# Patient Record
Sex: Male | Born: 1935 | Race: White | Hispanic: No | Smoking: Former smoker
Health system: Southern US, Community
[De-identification: ages and names within clinical notes are randomized; demographics above are authoritative.]

## PROBLEM LIST (undated history)

## (undated) DIAGNOSIS — N183 Chronic kidney disease, stage 3 unspecified: Secondary | ICD-10-CM

## (undated) DIAGNOSIS — R413 Other amnesia: Secondary | ICD-10-CM

## (undated) DIAGNOSIS — R55 Syncope and collapse: Secondary | ICD-10-CM

## (undated) DIAGNOSIS — H5440 Blindness, one eye, unspecified eye: Secondary | ICD-10-CM

## (undated) DIAGNOSIS — C140 Malignant neoplasm of pharynx, unspecified: Secondary | ICD-10-CM

## (undated) DIAGNOSIS — H9190 Unspecified hearing loss, unspecified ear: Secondary | ICD-10-CM

## (undated) DIAGNOSIS — M5410 Radiculopathy, site unspecified: Secondary | ICD-10-CM

## (undated) DIAGNOSIS — N400 Enlarged prostate without lower urinary tract symptoms: Secondary | ICD-10-CM

## (undated) DIAGNOSIS — Z87442 Personal history of urinary calculi: Secondary | ICD-10-CM

## (undated) DIAGNOSIS — T887XXA Unspecified adverse effect of drug or medicament, initial encounter: Secondary | ICD-10-CM

## (undated) DIAGNOSIS — K219 Gastro-esophageal reflux disease without esophagitis: Secondary | ICD-10-CM

## (undated) DIAGNOSIS — I714 Abdominal aortic aneurysm, without rupture, unspecified: Secondary | ICD-10-CM

## (undated) DIAGNOSIS — I2510 Atherosclerotic heart disease of native coronary artery without angina pectoris: Secondary | ICD-10-CM

## (undated) DIAGNOSIS — M47812 Spondylosis without myelopathy or radiculopathy, cervical region: Secondary | ICD-10-CM

## (undated) DIAGNOSIS — K120 Recurrent oral aphthae: Secondary | ICD-10-CM

## (undated) DIAGNOSIS — E785 Hyperlipidemia, unspecified: Secondary | ICD-10-CM

## (undated) DIAGNOSIS — I6529 Occlusion and stenosis of unspecified carotid artery: Secondary | ICD-10-CM

## (undated) HISTORY — DX: Malignant neoplasm of pharynx, unspecified: C14.0

## (undated) HISTORY — DX: Unspecified adverse effect of drug or medicament, initial encounter: T88.7XXA

## (undated) HISTORY — PX: HERNIA REPAIR: SHX51

## (undated) HISTORY — DX: Abdominal aortic aneurysm, without rupture, unspecified: I71.40

## (undated) HISTORY — DX: Other amnesia: R41.3

## (undated) HISTORY — DX: Gastro-esophageal reflux disease without esophagitis: K21.9

## (undated) HISTORY — DX: Spondylosis without myelopathy or radiculopathy, cervical region: M47.812

## (undated) HISTORY — PX: CAROTID ENDARTERECTOMY: SUR193

## (undated) HISTORY — PX: PROSTATE SURGERY: SHX751

## (undated) HISTORY — PX: BACK SURGERY: SHX140

## (undated) HISTORY — DX: Personal history of urinary calculi: Z87.442

## (undated) HISTORY — DX: Atherosclerotic heart disease of native coronary artery without angina pectoris: I25.10

## (undated) HISTORY — DX: Unspecified hearing loss, unspecified ear: H91.90

## (undated) HISTORY — DX: Syncope and collapse: R55

## (undated) HISTORY — DX: Abdominal aortic aneurysm, without rupture: I71.4

## (undated) HISTORY — DX: Occlusion and stenosis of unspecified carotid artery: I65.29

## (undated) HISTORY — DX: Blindness, one eye, unspecified eye: H54.40

## (undated) HISTORY — PX: CHOLECYSTECTOMY: SHX55

## (undated) HISTORY — PX: CARDIAC CATHETERIZATION: SHX172

## (undated) HISTORY — PX: THROAT SURGERY: SHX803

## (undated) HISTORY — DX: Recurrent oral aphthae: K12.0

## (undated) HISTORY — DX: Radiculopathy, site unspecified: M54.10

## (undated) HISTORY — PX: CORONARY ARTERY BYPASS GRAFT: SHX141

## (undated) HISTORY — PX: OTHER SURGICAL HISTORY: SHX169

## (undated) HISTORY — DX: Benign prostatic hyperplasia without lower urinary tract symptoms: N40.0

## (undated) HISTORY — DX: Hyperlipidemia, unspecified: E78.5

---

## 1996-01-17 HISTORY — PX: CORONARY ARTERY BYPASS GRAFT: SHX141

## 1998-07-01 ENCOUNTER — Ambulatory Visit (HOSPITAL_COMMUNITY): Admission: RE | Admit: 1998-07-01 | Discharge: 1998-07-01 | Payer: Self-pay | Admitting: Urology

## 1998-07-01 ENCOUNTER — Encounter: Payer: Self-pay | Admitting: Urology

## 1998-09-21 ENCOUNTER — Ambulatory Visit (HOSPITAL_COMMUNITY): Admission: RE | Admit: 1998-09-21 | Discharge: 1998-09-21 | Payer: Self-pay | Admitting: Urology

## 1998-09-21 ENCOUNTER — Encounter: Payer: Self-pay | Admitting: Urology

## 2001-11-04 ENCOUNTER — Encounter: Admission: RE | Admit: 2001-11-04 | Discharge: 2001-11-04 | Payer: Self-pay | Admitting: Otolaryngology

## 2001-11-04 ENCOUNTER — Encounter: Payer: Self-pay | Admitting: Otolaryngology

## 2002-03-06 ENCOUNTER — Inpatient Hospital Stay (HOSPITAL_COMMUNITY): Admission: EM | Admit: 2002-03-06 | Discharge: 2002-03-07 | Payer: Self-pay | Admitting: Emergency Medicine

## 2004-03-01 ENCOUNTER — Ambulatory Visit: Payer: Self-pay | Admitting: Internal Medicine

## 2004-09-06 ENCOUNTER — Ambulatory Visit: Payer: Self-pay | Admitting: Internal Medicine

## 2004-11-15 ENCOUNTER — Ambulatory Visit: Payer: Self-pay | Admitting: Internal Medicine

## 2005-02-08 ENCOUNTER — Encounter: Admission: RE | Admit: 2005-02-08 | Discharge: 2005-02-08 | Payer: Self-pay | Admitting: Neurology

## 2005-02-14 ENCOUNTER — Ambulatory Visit: Payer: Self-pay | Admitting: Internal Medicine

## 2005-02-27 ENCOUNTER — Encounter: Admission: RE | Admit: 2005-02-27 | Discharge: 2005-02-27 | Payer: Self-pay | Admitting: Neurology

## 2005-04-18 ENCOUNTER — Ambulatory Visit: Payer: Self-pay | Admitting: Internal Medicine

## 2005-06-20 ENCOUNTER — Encounter: Admission: RE | Admit: 2005-06-20 | Discharge: 2005-06-20 | Payer: Self-pay | Admitting: Neurosurgery

## 2005-07-04 ENCOUNTER — Ambulatory Visit: Payer: Self-pay | Admitting: Internal Medicine

## 2005-10-19 ENCOUNTER — Ambulatory Visit: Payer: Self-pay | Admitting: Internal Medicine

## 2005-12-21 ENCOUNTER — Encounter: Admission: RE | Admit: 2005-12-21 | Discharge: 2005-12-21 | Payer: Self-pay | Admitting: Neurosurgery

## 2006-02-06 ENCOUNTER — Ambulatory Visit: Payer: Self-pay | Admitting: Internal Medicine

## 2006-04-10 ENCOUNTER — Ambulatory Visit: Payer: Self-pay | Admitting: Internal Medicine

## 2006-04-22 ENCOUNTER — Emergency Department (HOSPITAL_COMMUNITY): Admission: EM | Admit: 2006-04-22 | Discharge: 2006-04-22 | Payer: Self-pay | Admitting: Emergency Medicine

## 2006-05-01 ENCOUNTER — Ambulatory Visit (HOSPITAL_BASED_OUTPATIENT_CLINIC_OR_DEPARTMENT_OTHER): Admission: RE | Admit: 2006-05-01 | Discharge: 2006-05-01 | Payer: Self-pay | Admitting: Urology

## 2006-06-25 ENCOUNTER — Ambulatory Visit: Payer: Self-pay | Admitting: Internal Medicine

## 2006-06-25 LAB — HM COLONOSCOPY

## 2006-07-04 ENCOUNTER — Ambulatory Visit: Payer: Self-pay | Admitting: Internal Medicine

## 2006-07-04 LAB — CONVERTED CEMR LAB
Bilirubin, Direct: 0.2 mg/dL (ref 0.0–0.3)
LDL Cholesterol: 39 mg/dL (ref 0–99)
Total Bilirubin: 0.9 mg/dL (ref 0.3–1.2)
VLDL: 12 mg/dL (ref 0–40)

## 2006-07-11 ENCOUNTER — Ambulatory Visit: Payer: Self-pay | Admitting: Internal Medicine

## 2006-09-10 ENCOUNTER — Inpatient Hospital Stay (HOSPITAL_COMMUNITY): Admission: EM | Admit: 2006-09-10 | Discharge: 2006-09-12 | Payer: Self-pay | Admitting: Emergency Medicine

## 2006-09-10 ENCOUNTER — Ambulatory Visit: Payer: Self-pay | Admitting: Vascular Surgery

## 2006-09-14 DIAGNOSIS — E785 Hyperlipidemia, unspecified: Secondary | ICD-10-CM

## 2006-09-14 DIAGNOSIS — I2510 Atherosclerotic heart disease of native coronary artery without angina pectoris: Secondary | ICD-10-CM

## 2006-09-14 DIAGNOSIS — Z87442 Personal history of urinary calculi: Secondary | ICD-10-CM

## 2006-09-21 ENCOUNTER — Ambulatory Visit (HOSPITAL_BASED_OUTPATIENT_CLINIC_OR_DEPARTMENT_OTHER): Admission: RE | Admit: 2006-09-21 | Discharge: 2006-09-21 | Payer: Self-pay | Admitting: Urology

## 2006-10-17 ENCOUNTER — Ambulatory Visit: Payer: Self-pay | Admitting: Internal Medicine

## 2006-10-17 LAB — CONVERTED CEMR LAB
AST: 27 units/L (ref 0–37)
Alkaline Phosphatase: 87 units/L (ref 39–117)
Cholesterol: 108 mg/dL (ref 0–200)
Creatinine, Ser: 1.2 mg/dL (ref 0.4–1.5)
GFR calc Af Amer: 76 mL/min
Glucose, Bld: 87 mg/dL (ref 70–99)
HDL: 33 mg/dL — ABNORMAL LOW (ref >39.0)
Potassium: 3.9 meq/L (ref 3.5–5.1)
Sodium: 144 meq/L (ref 135–145)
Total CHOL/HDL Ratio: 3.3

## 2006-10-24 ENCOUNTER — Ambulatory Visit: Payer: Self-pay | Admitting: Internal Medicine

## 2006-10-24 DIAGNOSIS — K219 Gastro-esophageal reflux disease without esophagitis: Secondary | ICD-10-CM

## 2006-10-24 LAB — CONVERTED CEMR LAB: HDL goal, serum: 40 mg/dL

## 2006-11-30 ENCOUNTER — Encounter: Admission: RE | Admit: 2006-11-30 | Discharge: 2006-11-30 | Payer: Self-pay | Admitting: Vascular Surgery

## 2006-11-30 ENCOUNTER — Ambulatory Visit: Payer: Self-pay | Admitting: Vascular Surgery

## 2006-11-30 ENCOUNTER — Encounter: Payer: Self-pay | Admitting: Internal Medicine

## 2006-12-17 ENCOUNTER — Telehealth: Payer: Self-pay | Admitting: Internal Medicine

## 2006-12-27 ENCOUNTER — Encounter: Payer: Self-pay | Admitting: Internal Medicine

## 2006-12-27 ENCOUNTER — Encounter: Admission: RE | Admit: 2006-12-27 | Discharge: 2006-12-27 | Payer: Self-pay | Admitting: Neurosurgery

## 2007-02-19 ENCOUNTER — Ambulatory Visit: Payer: Self-pay | Admitting: Internal Medicine

## 2007-02-25 ENCOUNTER — Ambulatory Visit: Payer: Self-pay | Admitting: Internal Medicine

## 2007-03-01 ENCOUNTER — Telehealth: Payer: Self-pay | Admitting: Internal Medicine

## 2007-03-04 ENCOUNTER — Ambulatory Visit: Payer: Self-pay | Admitting: Internal Medicine

## 2007-03-05 ENCOUNTER — Telehealth: Payer: Self-pay | Admitting: *Deleted

## 2007-03-05 ENCOUNTER — Ambulatory Visit: Payer: Self-pay | Admitting: Internal Medicine

## 2007-03-05 DIAGNOSIS — T887XXA Unspecified adverse effect of drug or medicament, initial encounter: Secondary | ICD-10-CM | POA: Insufficient documentation

## 2007-03-05 LAB — CONVERTED CEMR LAB
BUN: 12 mg/dL (ref 6–23)
Creatinine, Ser: 1.1 mg/dL (ref 0.4–1.5)

## 2007-03-06 ENCOUNTER — Ambulatory Visit: Payer: Self-pay | Admitting: Internal Medicine

## 2007-03-11 ENCOUNTER — Telehealth: Payer: Self-pay | Admitting: Internal Medicine

## 2007-07-02 ENCOUNTER — Ambulatory Visit: Payer: Self-pay | Admitting: Internal Medicine

## 2007-08-14 ENCOUNTER — Telehealth: Payer: Self-pay | Admitting: Internal Medicine

## 2007-08-14 DIAGNOSIS — K120 Recurrent oral aphthae: Secondary | ICD-10-CM

## 2007-11-12 ENCOUNTER — Ambulatory Visit: Payer: Self-pay | Admitting: Internal Medicine

## 2007-11-12 LAB — CONVERTED CEMR LAB
ALT: 16 units/L (ref 0–53)
BUN: 11 mg/dL (ref 6–23)
Bilirubin, Direct: 0.2 mg/dL (ref 0.0–0.3)
Eosinophils Absolute: 0.3 10*3/uL (ref 0.0–0.7)
Eosinophils Relative: 4.9 % (ref 0.0–5.0)
GFR calc Af Amer: 76 mL/min
GFR calc non Af Amer: 63 mL/min
Hemoglobin: 14.9 g/dL (ref 13.0–17.0)
MCHC: 34.1 g/dL (ref 30.0–36.0)
MCV: 94 fL (ref 78.0–100.0)
Neutro Abs: 3.4 10*3/uL (ref 1.4–7.7)
PSA: 2.55 ng/mL (ref 0.10–4.00)
Platelets: 178 10*3/uL (ref 150–400)
Potassium: 4.2 meq/L (ref 3.5–5.1)
RDW: 13.3 % (ref 11.5–14.6)
Total CHOL/HDL Ratio: 2.8
Total Protein: 6.5 g/dL (ref 6.0–8.3)
VLDL: 10 mg/dL (ref 0–40)

## 2007-11-25 ENCOUNTER — Ambulatory Visit: Payer: Self-pay | Admitting: Internal Medicine

## 2007-11-25 DIAGNOSIS — N400 Enlarged prostate without lower urinary tract symptoms: Secondary | ICD-10-CM

## 2007-11-29 ENCOUNTER — Ambulatory Visit: Payer: Self-pay | Admitting: Vascular Surgery

## 2008-02-07 ENCOUNTER — Encounter: Admission: RE | Admit: 2008-02-07 | Discharge: 2008-02-07 | Payer: Self-pay | Admitting: Neurosurgery

## 2008-03-19 ENCOUNTER — Ambulatory Visit: Payer: Self-pay | Admitting: Internal Medicine

## 2008-03-19 DIAGNOSIS — M545 Low back pain: Secondary | ICD-10-CM

## 2008-04-15 ENCOUNTER — Encounter: Admission: RE | Admit: 2008-04-15 | Discharge: 2008-04-15 | Payer: Self-pay | Admitting: Neurosurgery

## 2008-05-26 ENCOUNTER — Ambulatory Visit: Payer: Self-pay | Admitting: Internal Medicine

## 2008-05-26 LAB — CONVERTED CEMR LAB
AST: 23 units/L (ref 0–37)
Albumin: 3.7 g/dL (ref 3.5–5.2)
Cholesterol: 144 mg/dL (ref 0–200)
LDL Cholesterol: 85 mg/dL (ref 0–99)
Total Protein: 6.3 g/dL (ref 6.0–8.3)

## 2008-06-02 ENCOUNTER — Ambulatory Visit: Payer: Self-pay | Admitting: Internal Medicine

## 2008-08-26 ENCOUNTER — Encounter: Payer: Self-pay | Admitting: Internal Medicine

## 2008-10-06 ENCOUNTER — Ambulatory Visit: Payer: Self-pay | Admitting: Internal Medicine

## 2008-10-29 ENCOUNTER — Ambulatory Visit: Payer: Self-pay | Admitting: Internal Medicine

## 2008-10-29 ENCOUNTER — Telehealth: Payer: Self-pay | Admitting: Internal Medicine

## 2008-10-29 ENCOUNTER — Inpatient Hospital Stay (HOSPITAL_COMMUNITY): Admission: EM | Admit: 2008-10-29 | Discharge: 2008-10-30 | Payer: Self-pay | Admitting: Emergency Medicine

## 2008-10-30 ENCOUNTER — Ambulatory Visit: Payer: Self-pay | Admitting: Internal Medicine

## 2008-11-06 ENCOUNTER — Telehealth: Payer: Self-pay | Admitting: Internal Medicine

## 2008-11-12 ENCOUNTER — Ambulatory Visit: Payer: Self-pay | Admitting: Internal Medicine

## 2008-11-13 DIAGNOSIS — I714 Abdominal aortic aneurysm, without rupture, unspecified: Secondary | ICD-10-CM | POA: Insufficient documentation

## 2008-11-17 ENCOUNTER — Ambulatory Visit: Payer: Self-pay | Admitting: Internal Medicine

## 2008-11-17 DIAGNOSIS — R55 Syncope and collapse: Secondary | ICD-10-CM

## 2008-11-27 ENCOUNTER — Ambulatory Visit: Payer: Self-pay | Admitting: Vascular Surgery

## 2008-11-27 ENCOUNTER — Telehealth: Payer: Self-pay | Admitting: Internal Medicine

## 2008-11-27 ENCOUNTER — Encounter (INDEPENDENT_AMBULATORY_CARE_PROVIDER_SITE_OTHER): Payer: Self-pay | Admitting: *Deleted

## 2008-12-22 ENCOUNTER — Ambulatory Visit: Payer: Self-pay | Admitting: Internal Medicine

## 2008-12-22 LAB — CONVERTED CEMR LAB
ALT: 14 units/L (ref 0–53)
BUN: 14 mg/dL (ref 6–23)
Chloride: 111 meq/L (ref 96–112)
Cholesterol: 124 mg/dL (ref 0–200)
Creatinine, Ser: 1.2 mg/dL (ref 0.4–1.5)
GFR calc non Af Amer: 62.63 mL/min (ref >60)
Glucose, Bld: 84 mg/dL (ref 70–99)
Hemoglobin: 15 g/dL (ref 13.0–17.0)
LDL Cholesterol: 68 mg/dL (ref 0–99)
MCHC: 34 g/dL (ref 30.0–36.0)
MCV: 94.7 fL (ref 78.0–100.0)
Neutro Abs: 3.4 10*3/uL (ref 1.4–7.7)
Potassium: 4.1 meq/L (ref 3.5–5.1)
RBC: 4.64 M/uL (ref 4.22–5.81)
Total CHOL/HDL Ratio: 3
Triglycerides: 83 mg/dL (ref 0.0–149.0)
VLDL: 16.6 mg/dL (ref 0.0–40.0)
WBC: 5.1 10*3/uL (ref 4.5–10.5)

## 2008-12-29 ENCOUNTER — Ambulatory Visit: Payer: Self-pay | Admitting: Internal Medicine

## 2009-03-16 ENCOUNTER — Ambulatory Visit: Payer: Self-pay | Admitting: Internal Medicine

## 2009-03-16 DIAGNOSIS — I10 Essential (primary) hypertension: Secondary | ICD-10-CM | POA: Insufficient documentation

## 2009-03-16 DIAGNOSIS — R0989 Other specified symptoms and signs involving the circulatory and respiratory systems: Secondary | ICD-10-CM

## 2009-04-06 ENCOUNTER — Ambulatory Visit: Payer: Self-pay | Admitting: Internal Medicine

## 2009-04-06 LAB — CONVERTED CEMR LAB
Chloride: 111 meq/L (ref 96–112)
Creatinine, Ser: 1.1 mg/dL (ref 0.4–1.5)
GFR calc non Af Amer: 69.2 mL/min (ref >60)
PSA: 2.56 ng/mL (ref 0.10–4.00)

## 2009-04-07 ENCOUNTER — Encounter: Payer: Self-pay | Admitting: Cardiovascular Disease

## 2009-04-08 ENCOUNTER — Telehealth: Payer: Self-pay | Admitting: Internal Medicine

## 2009-04-08 ENCOUNTER — Encounter: Payer: Self-pay | Admitting: Internal Medicine

## 2009-04-08 ENCOUNTER — Ambulatory Visit: Payer: Self-pay

## 2009-04-09 ENCOUNTER — Encounter: Payer: Self-pay | Admitting: Internal Medicine

## 2009-04-09 ENCOUNTER — Ambulatory Visit: Payer: Self-pay | Admitting: Vascular Surgery

## 2009-04-20 ENCOUNTER — Encounter: Payer: Self-pay | Admitting: Vascular Surgery

## 2009-04-20 ENCOUNTER — Ambulatory Visit: Payer: Self-pay | Admitting: Vascular Surgery

## 2009-04-20 ENCOUNTER — Inpatient Hospital Stay (HOSPITAL_COMMUNITY): Admission: RE | Admit: 2009-04-20 | Discharge: 2009-04-21 | Payer: Self-pay | Admitting: Vascular Surgery

## 2009-05-14 ENCOUNTER — Encounter: Payer: Self-pay | Admitting: Internal Medicine

## 2009-05-14 ENCOUNTER — Ambulatory Visit: Payer: Self-pay | Admitting: Vascular Surgery

## 2009-06-10 ENCOUNTER — Inpatient Hospital Stay (HOSPITAL_COMMUNITY): Admission: EM | Admit: 2009-06-10 | Discharge: 2009-06-11 | Payer: Self-pay

## 2009-06-18 ENCOUNTER — Emergency Department (HOSPITAL_COMMUNITY): Admission: EM | Admit: 2009-06-18 | Discharge: 2009-06-19 | Payer: Self-pay | Admitting: Emergency Medicine

## 2009-06-23 ENCOUNTER — Telehealth: Payer: Self-pay | Admitting: Internal Medicine

## 2009-07-20 ENCOUNTER — Encounter: Payer: Self-pay | Admitting: Internal Medicine

## 2009-08-02 ENCOUNTER — Ambulatory Visit: Payer: Self-pay | Admitting: Internal Medicine

## 2009-08-02 LAB — CONVERTED CEMR LAB
BUN: 16 mg/dL (ref 6–23)
Bilirubin, Direct: 0.2 mg/dL (ref 0.0–0.3)
CO2: 31 meq/L (ref 19–32)
Calcium: 9.1 mg/dL (ref 8.4–10.5)
Chloride: 112 meq/L (ref 96–112)
Creatinine, Ser: 1.1 mg/dL (ref 0.4–1.5)
Eosinophils Absolute: 0.3 10*3/uL (ref 0.0–0.7)
Eosinophils Relative: 3.6 % (ref 0.0–5.0)
GFR calc non Af Amer: 67.02 mL/min (ref >60)
Glucose, Bld: 72 mg/dL (ref 70–99)
MCHC: 33.4 g/dL (ref 30.0–36.0)
Neutrophils Relative %: 71.2 % (ref 43.0–77.0)
RBC: 4.62 M/uL (ref 4.22–5.81)
Sodium: 146 meq/L — ABNORMAL HIGH (ref 135–145)
Total Bilirubin: 1.1 mg/dL (ref 0.3–1.2)
WBC: 8 10*3/uL (ref 4.5–10.5)

## 2009-08-12 ENCOUNTER — Telehealth: Payer: Self-pay | Admitting: Internal Medicine

## 2009-09-21 ENCOUNTER — Ambulatory Visit: Payer: Self-pay | Admitting: Internal Medicine

## 2009-10-04 ENCOUNTER — Ambulatory Visit: Payer: Self-pay | Admitting: Internal Medicine

## 2009-10-15 ENCOUNTER — Encounter: Payer: Self-pay | Admitting: Internal Medicine

## 2009-10-18 ENCOUNTER — Encounter: Payer: Self-pay | Admitting: Internal Medicine

## 2009-10-18 ENCOUNTER — Ambulatory Visit: Payer: Self-pay

## 2009-10-22 ENCOUNTER — Encounter: Payer: Self-pay | Admitting: Internal Medicine

## 2009-10-28 ENCOUNTER — Encounter: Admission: RE | Admit: 2009-10-28 | Discharge: 2009-10-28 | Payer: Self-pay | Admitting: Neurosurgery

## 2009-10-28 ENCOUNTER — Encounter: Payer: Self-pay | Admitting: Internal Medicine

## 2009-11-02 ENCOUNTER — Ambulatory Visit: Payer: Self-pay | Admitting: Internal Medicine

## 2009-11-03 ENCOUNTER — Telehealth: Payer: Self-pay | Admitting: Internal Medicine

## 2009-11-05 ENCOUNTER — Telehealth: Payer: Self-pay | Admitting: Internal Medicine

## 2009-11-15 ENCOUNTER — Telehealth: Payer: Self-pay | Admitting: Internal Medicine

## 2009-11-17 ENCOUNTER — Inpatient Hospital Stay (HOSPITAL_COMMUNITY): Admission: RE | Admit: 2009-11-17 | Discharge: 2009-11-18 | Payer: Self-pay | Admitting: Neurosurgery

## 2009-12-06 ENCOUNTER — Encounter: Admission: RE | Admit: 2009-12-06 | Discharge: 2009-12-06 | Payer: Self-pay | Admitting: Neurosurgery

## 2009-12-14 ENCOUNTER — Encounter: Payer: Self-pay | Admitting: Internal Medicine

## 2009-12-14 ENCOUNTER — Ambulatory Visit: Payer: Self-pay | Admitting: Vascular Surgery

## 2010-02-04 ENCOUNTER — Ambulatory Visit: Payer: Self-pay | Admitting: Vascular Surgery

## 2010-02-04 ENCOUNTER — Ambulatory Visit
Admission: RE | Admit: 2010-02-04 | Discharge: 2010-02-04 | Payer: Self-pay | Source: Home / Self Care | Attending: Internal Medicine | Admitting: Internal Medicine

## 2010-02-15 NOTE — Assessment & Plan Note (Signed)
Summary: 4 MONTH ROV/SL   Visit Type:  Follow-up Primary Provider:  Dr.J. Lovell Sheehan  CC:  no complaints.  History of Present Illness: [name] is a 77 year old patient with a history of coronary artery disease status post five-vessel CABG in December 1994, who underwent cath in 10/10 which showed severe native 3-v CAD with all 5/5 grafts patent. Had some bradycardict and an event monitor was placed which showeds sinus rhythm 50-85. Occasional PVC. Brief 6-beat run SVT.   Returns for routine f/u. Doing very well. No CP or SOB. No syncope or presyncope. No neurologic deficits. Saw Dr. Arbie Cookey in November 2010 for f/u of his AAA. No problems with meds. Following lipids with Dr. Lovell Sheehan. 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  Current Medications (verified): 1)  Aspirin Ec 325 Mg  Tbec (Aspirin) .... 4 Times A Week 2)  Lexapro 10 Mg  Tabs (Escitalopram Oxalate) .... Take 1 Tablet By Mouth Once A Day 3)  Mobic 15 Mg  Tabs (Meloxicam) .... As Needed 4)  Flomax 0.4 Mg  Cp24 (Tamsulosin Hcl) .... Q Hs   May Use Generic 5)  Vitamin E 400 Unit Caps (Vitamin E) 6)  Crestor 20 Mg Tabs (Rosuvastatin Calcium) .... 1/2 Every Other Day  Allergies (verified): 1)  ! Vibramycin (Doxycycline Hyclate) 2)  ! Codeine Sulfate (Codeine Sulfate)  Past History:  Past Medical History: 1. left subclavian bruit 2. ABDOMINAL AORTIC ANEURYSM     --u/s 11/09 3.8x3.8cm    --f 3. BACK PAIN, LUMBAR, WITH RADICULOPATHY 4. BENIGN PROSTATIC HYPERTROPHY, WITH URINARY OBSTRUCTION 5. APHTHOUS ULCERS  6. UNS ADVRS EFF UNS RX MEDICINAL&BIOLOGICAL SBSTNC  7. GERD  8. NEPHROLITHIASIS, HX OF  9. HYPERLIPIDEMIA 10. CORONARY ARTERY DISEASE        --s/p CABG 1993       --Cardiac cath 10/29/08. Severe 3-v CAD all grafts patent 11. FAMILY HISTORY OF CAD MALE 1ST DEGREE RELATIVE <50  12. Presyncope - monitor ok 10/10 13. 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   Vital Signs:  Patient profile:   77 year old male Height:      72 inches Weight:      191 pounds BMI:     26.00 Pulse rate:   58 / minute BP sitting:   132 / 68  (left arm) Cuff size:   regular  Vitals Entered By: Hardin Negus, RMA (March 16, 2009 8:12 AM)  Physical Exam  General:  Gen: well appearing. no resp difficulty. hoarse voice and hard of hearing HEENT: normal Neck: supple. no JVD. Carotids 2+ bilat; prominent L bruit. No lymphadenopathy or thryomegaly appreciated. Cor: Barrel chested. PMI nondisplaced. Regular rate & rhythm. No rubs, gallops, murmur. Lungs: clear Abdomen: soft, nontender, nondistended. No hepatosplenomegaly. No bruits or masses. Good bowel sounds. Extremities: no cyanosis, clubbing, rash, edema Neuro: alert & orientedx3, cranial nerves grossly intact. moves all 4 extremities w/o difficulty. affect pleasant    Impression & Recommendations:  Problem # 1:  CORONARY ARTERY DISEASE (ICD-414.00) Stable. No evidence of ischemia. Recent cath reassuring. Continue current regimen.  Problem # 2:  CAROTID BRUIT (ICD-785.9) Check carotid u/s.  Problem # 3:  HYPERTENSION, BENIGN (ICD-401.1) Blood pressure well controlled. Continue current regimen.  Problem # 4:  HYPERLIPIDEMIA (ICD-272.4) Goal LDL <70. Continue statin.   Other Orders: EKG w/ Interpretation (  93000) Carotid Duplex (Carotid Duplex)  Patient Instructions: 1)  Your physician has requested that you have a carotid duplex. This test is an ultrasound of the carotid arteries in your neck. It looks at blood flow through these arteries that supply the brain with blood. Allow one hour for this exam. There are no restrictions or special instructions. 2)  Follow up in 6 months

## 2010-02-15 NOTE — Progress Notes (Signed)
Summary: surg clearance  Phone Note Other Incoming   Summary of Call: Received surg clearance form from Dr Gerlene Fee, pt needs to have c/4-5, c/5-6, c/6-7 Anterior cervical fusion it is sch for 11/2 and they need cardiac clearance, will send mess to Dr Gala Romney for review  Initial call taken by: Meredith Staggers, RN,  November 05, 2009 12:52 PM  Follow-up for Phone Call        cath looked good 1 year ago so cleared to proceed unless having recurrent CP or other anginal symptoms Brett Patty, MD, [facility]  November 05, 2009 2:06 PM  pt states he has been doing ok cardiac wise no chest pain, let him know Dr Gala Romney had cleared him for surgery, note faxed Meredith Staggers, RN  November 05, 2009 4:07 PM

## 2010-02-15 NOTE — Miscellaneous (Signed)
Summary: Orders Update  Clinical Lists Changes 
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
## 2010-02-15 NOTE — Progress Notes (Signed)
Summary: med questions?  Phone Note Call from Patient   Caller: Patient Call For: Stacie Glaze MD Summary of Call: Pt is taking the Lexapro and Clonazepam 1/2 daily, and his wife thinks he is sleeping too much.  Do you have suggestions? [phone] Initial call taken by: Lynann Beaver CMA,  August 12, 2009 8:32 AM  Follow-up for Phone Call        make sure he is taking the clonazepam at night only Follow-up by: Stacie Glaze MD,  August 12, 2009 9:11 AM  Additional Follow-up for Phone Call Additional follow up Details #1::        Notified wife. Additional Follow-up by: Lynann Beaver CMA,  August 12, 2009 9:21 AM

## 2010-02-15 NOTE — Consult Note (Signed)
Summary: [facility]  [facility]   Imported By: Maryln Gottron 11/10/2009 13:53:01  _____________________________________________________________________  External Attachment:    Type:   Image     Comment:   External Document

## 2010-02-15 NOTE — Miscellaneous (Signed)
Summary: Orders Update  Clinical Lists Changes  Orders: Added new Test order of Carotid Duplex (Carotid Duplex) - Signed 

## 2010-02-15 NOTE — Letter (Signed)
Summary: [facility]  [facility]   Imported By: Maryln Gottron 08/05/2009 15:06:08  _____________________________________________________________________  External Attachment:    Type:   Image     Comment:   External Document
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
## 2010-02-15 NOTE — Assessment & Plan Note (Signed)
Summary: ROA/FUP/RCD   Vital Signs:  Patient profile:   77 year old male Height:      72 inches Weight:      178 pounds BMI:     24.23 Temp:     98.2 degrees F oral Pulse rate:   68 / minute Resp:     14 per minute BP sitting:   130 / 70  Vitals Entered By: Willy Eddy, LPN (October 04, 2009 10:25 AM) CC: roa-stopped lyrica-didnt help and caused constipation- and stopped clonazepam due to sleeping all the time- wife wants him to remain on lexapro, Hypertension Management Is Patient Diabetic? No   Primary Care Provider:  Dr.J. Lovell Sheehan  CC:  roa-stopped lyrica-didnt help and caused constipation- and stopped clonazepam due to sleeping all the time- wife wants him to remain on lexapro and Hypertension Management.  History of Present Illness: The mobic and lyrica did not help the pain in the neck results in numbness and tingling in both hands the pt rates the 9/10 and interfers with sleep the pt had an MRI of the neck in the past  ( dr Channing Mutters) the pt has a hx of accident may 26th no hx of neck surgery in the past he had shoulder sugery with  Dr Shelle Iron for trama from the shoulder The orthopedist wanted him to be seen by a neurosurgeon The hydrocodone has caused urinary retention the pain has been to the level of crying! The pt has positional hypotension The MRI may showed multilevel disc impingemen  Hypertension History:      He denies headache, chest pain, palpitations, dyspnea with exertion, orthopnea, PND, peripheral edema, visual symptoms, neurologic problems, syncope, and side effects from treatment.        Positive major cardiovascular risk factors include male age 69 years old or older, hyperlipidemia, and hypertension.  Negative major cardiovascular risk factors include no history of diabetes, negative family history for ischemic heart disease, and non-tobacco-user status.        Positive history for target organ damage include ASHD (either angina/prior MI/prior CABG)  and peripheral vascular disease.  Further assessment for target organ damage reveals no history of stroke/TIA.     Preventive Screening-Counseling & Management  Alcohol-Tobacco     Smoking Status: quit     Year Quit: 2008     Passive Smoke Exposure: no  Current Problems (verified): 1)  Hypertension, Benign  (ICD-401.1) 2)  Carotid Bruit  (ICD-785.9) 3)  Syncope and Collapse  (ICD-780.2) 4)  Syncope and Collapse  (ICD-780.2) 5)  Abdominal Aortic Aneurysm  (ICD-441.4) 6)  Back Pain, Lumbar, With Radiculopathy  (ICD-724.4) 7)  Benign Prostatic Hypertrophy, With Urinary Obstruction  (ICD-600.01) 8)  Aphthous Ulcers  (ICD-528.2) 9)  Uns Advrs Eff Uns Rx Medicinal&biological Sbstnc  (ICD-995.20) 10)  Gerd  (ICD-530.81) 11)  Nephrolithiasis, Hx of  (ICD-V13.01) 12)  Hyperlipidemia  (ICD-272.4) 13)  Coronary Artery Disease  (ICD-414.00) 14)  Family History of Cad Male 1st Degree Relative <50  (ICD-V17.3)  Allergies: 1)  ! Vibramycin (Doxycycline Hyclate) 2)  ! Codeine Sulfate (Codeine Sulfate)  Review of Systems       Flu Vaccine Consent Questions     Do you have a history of severe allergic reactions to this vaccine? no    Any prior history of allergic reactions to egg and/or gelatin? no    Do you have a sensitivity to the preservative Thimersol? no    Do you have a past history of Guillan-Barre Syndrome?  no    Do you currently have an acute febrile illness? no    Have you ever had a severe reaction to latex? no    Vaccine information given and explained to patient? yes    Are you currently pregnant? no    Lot Number:[id number]   Exp Date:07/16/2010   Site Given  Left Deltoid IM   Physical Exam  General:  Well-developed,well-nourished,in no acute distress; alert,appropriate and cooperative throughout examination Head:  normocephalic.  male-pattern balding.   Eyes:  pupils equal and pupils round.   Ears:  R ear normal and L ear normal.   Nose:  no external deformity and no  external erythema.   Mouth:  good dentition and pharynx pink and moist.   Neck:  nuchal rigidity and decreased ROM.   Lungs:  normal respiratory effort and no dullness.   Heart:  normal rate and regular rhythm.   Abdomen:  soft and normal bowel sounds.   Msk:  decreased ROM and joint tenderness.     Impression & Recommendations:  Problem # 1:  RADICULOPATHY, SIXTH CERVICAL NERVE, LEFT (ICD-723.4) note the MRI was in [place]y 2011 [facility] tram[facility]ol and medrol burst and taper for neck pain with referral to the neurosurgeon  Orders: Neurosurgeon Referral (Neurosurgeon)  Problem # 2:  HYPERTENSION, BENIGN (ICD-401.1)  BP today: 130/70 Prior BP: 102/68 (09/21/2009)  Prior 10 Yr Risk Heart Disease: N/A (10/24/2006)  Labs Reviewed: K+: 4.8 (08/02/2009) Creat: : 1.1 (08/02/2009)   Chol: 128 (08/02/2009)   HDL: 43.70 (08/02/2009)   LDL: 68 (12/22/2008)   TG: 83.0 (12/22/2008)  Problem # 3:  CORONARY ARTERY DISEASE (ICD-414.00)  His updated medication list for this problem includes:    Aspirin 81 Mg Tabs (Aspirin) ..... One daily  Cardiac Cath: yes (11/13/2008)  Labs Reviewed: Chol: 128 (08/02/2009)   HDL: 43.70 (08/02/2009)   LDL: 68 (12/22/2008)   TG: 83.0 (12/22/2008)  Lipid Goals: Chol Goal: 200 (10/24/2006)   HDL Goal: 40 (10/24/2006)   LDL Goal: 100 (10/24/2006)   TG Goal: 150 (10/24/2006)  Problem # 4:  HYPERLIPIDEMIA (ICD-272.4)  His updated medication list for this problem includes:    Crestor 20 Mg Tabs (Rosuvastatin calcium) .Marland Kitchen... 1/2 every other day  Labs Reviewed: SGOT: 19 (08/02/2009)   SGPT: 13 (08/02/2009)  Lipid Goals: Chol Goal: 200 (10/24/2006)   HDL Goal: 40 (10/24/2006)   LDL Goal: 100 (10/24/2006)   TG Goal: 150 (10/24/2006)  Prior 10 Yr Risk Heart Disease: N/A (10/24/2006)   HDL:43.70 (08/02/2009), 39.60 (12/22/2008)  LDL:68 (12/22/2008), 85 (05/26/2008)  Chol:128 (08/02/2009), 124 (12/22/2008)  Trig:83.0 (12/22/2008), 85.0 (05/26/2008)  Complete  Medication List: 1)  Aspirin 81 Mg Tabs (Aspirin) .... One daily 2)  Lexapro 10 Mg Tabs (Escitalopram oxalate) .... Take 1 tablet by mouth once a day 3)  Mobic 15 Mg Tabs (Meloxicam) .... As needed 4)  Flomax 0.4 Mg Cp24 (Tamsulosin hcl) .... Q hs   may use generic 5)  Vitamin E 400 Unit Caps (Vitamin e) 6)  Crestor 20 Mg Tabs (Rosuvastatin calcium) .... 1/2 every other day 7)  Methylprednisolone 4 Mg Tabs (Methylprednisolone) .... Two by mouth two times a day for 4 days the one by mouth two times a day for 4 days the one by mouth daily for 4 days the stop 8)  Tramadol Hcl 50 Mg Tabs (Tramadol hcl) .Marland Kitchen.. 1-2 by mouth q 6 hours as needed pain with 500 mg tylelol  Other Orders: Flu Vaccine 72yrs + MEDICARE  PATIENTS [phone]) Administration Flu vaccine - MCR (U0454)  Hypertension Assessment/Plan:      The patient's hypertensive risk group is category C: Target organ damage and/or diabetes.  Today's blood pressure is 130/70.  His blood pressure goal is < 140/90.  Patient Instructions: 1)  Please schedule a follow-up appointment in 1 month. Prescriptions: TRAMADOL HCL 50 MG TABS (TRAMADOL HCL) 1-2 by mouth q 6 hours as needed pain with 500 mg tylelol  #60 x 3   Entered and Authorized by:   Stacie Glaze MD   Signed by:   Stacie Glaze MD on 10/04/2009   Method used:   Electronically to        [facility]  [phone]* (retail)       [address]       [place], [place]  [address]       Ph: [phone] or [phone]       Fax: [phone]   RxID:   [id number] METHYLPREDNISOLONE 4 MG TABS (METHYLPREDNISOLONE) two by mouth two times a day for 4 days the one by mouth two times a day for 4 days the one by mouth daily for 4 days the stop  #30 x 0   Entered and Authorized by:   Stacie Glaze MD   Signed by:   Stacie Glaze MD on 10/04/2009   Method used:   Electronically to        [facility]  [phone]* (retail)       [address]       [place], [place]  [address]       Ph: [phone] or [phone]       Fax: [phone]   RxID:   [id number]

## 2010-02-15 NOTE — Assessment & Plan Note (Signed)
Summary: 1 month rov/njr   Vital Signs:  Patient profile:   77 year old male Height:      72 inches Weight:      180 pounds BMI:     24.50 Temp:     98.2 degrees F oral Pulse rate:   68 / minute Resp:     14 per minute BP sitting:   130 / 80  (left arm)  Vitals Entered By: Willy Eddy, LPN (November 02, 2009 10:17 AM) CC: roa- discuss n eurosurgeon consultaion, Hypertension Management Is Patient Diabetic? No   Primary Care Provider:  Stacie Glaze MD  CC:  roa- discuss n eurosurgeon consultaion and Hypertension Management.  History of Present Illness: doing well after carotid intervention  the US showed the carotid was open and the grafts were paten on the cath the pt has recently been to Dr Adriana Reams The pts has significant neck pain and the MRI showed dz but the myelogram did show c4 effacememt has surgery set up NOV second will send cardiologists note to Dr Gerlene Fee  Hypertension History:      He denies headache, chest pain, palpitations, dyspnea with exertion, orthopnea, PND, peripheral edema, visual symptoms, neurologic problems, syncope, and side effects from treatment.        Positive major cardiovascular risk factors include male age 75 years old or older, hyperlipidemia, and hypertension.  Negative major cardiovascular risk factors include no history of diabetes, negative family history for ischemic heart disease, and non-tobacco-user status.        Positive history for target organ damage include ASHD (either angina/prior MI/prior CABG) and peripheral vascular disease.  Further assessment for target organ damage reveals no history of stroke/TIA.     Preventive Screening-Counseling & Management  Alcohol-Tobacco     Smoking Status: quit     Year Quit: 2008     Passive Smoke Exposure: no  Problems Prior to Update: 1)  Radiculopathy, Sixth Cervical Nerve, Left  (ICD-723.4) 2)  Hypertension, Benign  (ICD-401.1) 3)  Carotid Bruit  (ICD-785.9) 4)  Syncope and  Collapse  (ICD-780.2) 5)  Syncope and Collapse  (ICD-780.2) 6)  Abdominal Aortic Aneurysm  (ICD-441.4) 7)  Back Pain, Lumbar, With Radiculopathy  (ICD-724.4) 8)  Benign Prostatic Hypertrophy, With Urinary Obstruction  (ICD-600.01) 9)  Aphthous Ulcers  (ICD-528.2) 10)  Uns Advrs Eff Uns Rx Medicinal&biological Sbstnc  (ICD-995.20) 11)  Gerd  (ICD-530.81) 12)  Nephrolithiasis, Hx of  (ICD-V13.01) 13)  Hyperlipidemia  (ICD-272.4) 14)  Coronary Artery Disease  (ICD-414.00) 15)  Family History of Cad Male 1st Degree Relative <50  (ICD-V17.3)  Current Problems (verified): 1)  Radiculopathy, Sixth Cervical Nerve, Left  (ICD-723.4) 2)  Hypertension, Benign  (ICD-401.1) 3)  Carotid Bruit  (ICD-785.9) 4)  Syncope and Collapse  (ICD-780.2) 5)  Syncope and Collapse  (ICD-780.2) 6)  Abdominal Aortic Aneurysm  (ICD-441.4) 7)  Back Pain, Lumbar, With Radiculopathy  (ICD-724.4) 8)  Benign Prostatic Hypertrophy, With Urinary Obstruction  (ICD-600.01) 9)  Aphthous Ulcers  (ICD-528.2) 10)  Uns Advrs Eff Uns Rx Medicinal&biological Sbstnc  (ICD-995.20) 11)  Gerd  (ICD-530.81) 12)  Nephrolithiasis, Hx of  (ICD-V13.01) 13)  Hyperlipidemia  (ICD-272.4) 14)  Coronary Artery Disease  (ICD-414.00) 15)  Family History of Cad Male 1st Degree Relative <50  (ICD-V17.3)  Medications Prior to Update: 1)  Aspirin 81 Mg Tabs (Aspirin) .... One Daily 2)  Lexapro 10 Mg  Tabs (Escitalopram Oxalate) .... Take 1 Tablet By Mouth Once A Day 3)  Mobic 15 Mg  Tabs (Meloxicam) .... As Needed 4)  Flomax 0.4 Mg  Cp24 (Tamsulosin Hcl) .... Q Hs   May Use Generic 5)  Vitamin E 400 Unit Caps (Vitamin E) 6)  Crestor 20 Mg Tabs (Rosuvastatin Calcium) .... 1/2 Every Other Day 7)  Methylprednisolone 4 Mg Tabs (Methylprednisolone) .... Two By Mouth Two Times A Day For 4 Days The One By Mouth Two Times A Day For 4 Days The One By Mouth Daily For 4 Days The Stop 8)  Tramadol Hcl 50 Mg Tabs (Tramadol Hcl) .Marland Kitchen.. 1-2 By Mouth Q 6 Hours  As Needed Pain With 500 Mg Tylelol  Current Medications (verified): 1)  Aspirin 81 Mg Tabs (Aspirin) .... One Daily 2)  Lexapro 10 Mg  Tabs (Escitalopram Oxalate) .... Take 1 Tablet By Mouth Once A Day 3)  Mobic 15 Mg  Tabs (Meloxicam) .... As Needed 4)  Flomax 0.4 Mg  Cp24 (Tamsulosin Hcl) .... Q Hs   May Use Generic 5)  Vitamin E 400 Unit Caps (Vitamin E) 6)  Crestor 20 Mg Tabs (Rosuvastatin Calcium) .... 1/2 Every Other Day 7)  Tramadol Hcl 50 Mg Tabs (Tramadol Hcl) .Marland Kitchen.. 1-2 By Mouth Q 6 Hours As Needed Pain With 500 Mg Tylelol  Allergies (verified): 1)  ! Vibramycin (Doxycycline Hyclate) 2)  ! Codeine Sulfate (Codeine Sulfate)  Past History:  Family History: Last updated: 11/13/2008 Family History of CAD Male 1st degree relative <50 Family History of CVA or Stroke:   Social History: Last updated: 11/13/2008 Married Former Smoker Alcohol use-no Retired  Drug Use - no  Risk Factors: Smoking Status: quit (11/02/2009) Passive Smoke Exposure: no (11/02/2009)  Past medical, surgical, family and social histories (including risk factors) reviewed, and no changes noted (except as noted below).  Past Medical History: Reviewed history from 03/16/2009 and no changes required. 1. left subclavian bruit 2. ABDOMINAL AORTIC ANEURYSM     --u/s 11/09 3.8x3.8cm    --f 3. BACK PAIN, LUMBAR, WITH RADICULOPATHY 4. BENIGN PROSTATIC HYPERTROPHY, WITH URINARY OBSTRUCTION 5. APHTHOUS ULCERS  6. UNS ADVRS EFF UNS RX MEDICINAL&BIOLOGICAL SBSTNC  7. GERD  8. NEPHROLITHIASIS, HX OF  9. HYPERLIPIDEMIA 10. CORONARY ARTERY DISEASE        --s/p CABG 1993       --Cardiac cath 10/29/08. Severe 3-v CAD all grafts patent 11. FAMILY HISTORY OF CAD MALE 1ST DEGREE RELATIVE <50  12. Presyncope - monitor ok 10/10 13. 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 Coronary artery bypass graft   1998 Cholecystectomy Prostatectomy back surgery renal stones surgery x 3 throat Cardiac Catheterization  Family History: Reviewed history from 11/13/2008 and no changes required. Family History of CAD Male 1st degree relative <50 Family History of CVA or Stroke:   Social History: Reviewed history from 11/13/2008 and no changes required. 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    Physical Exam  General:  Well-developed,well-nourished,in no acute distress; alert,appropriate and cooperative throughout examination Head:  normocephalic.  male-pattern balding.   Eyes:  pupils equal and  pupils round.   Ears:  R ear normal and L ear normal.   Nose:  no external deformity and no external erythema.   Mouth:  good dentition and pharynx pink and moist.   Neck:  nuchal rigidity and decreased ROM.   Lungs:  normal respiratory effort and no dullness.   Heart:  normal rate and regular rhythm.   Abdomen:  soft and normal bowel sounds.   Msk:  decreased ROM and joint tenderness.   Pulses:  R and L carotid,radial,femoral,dorsalis pedis and posterior tibial pulses are full and equal bilaterally Extremities:  No clubbing, cyanosis, edema, or deformity noted with normal full range of motion of all joints.   Neurologic:  alert & oriented X3, DTRs symmetrical and normal, and finger-to-nose normal.     Impression & Recommendations:  Problem # 1:  HYPERTENSION, BENIGN  (ICD-401.1)  BP today: 130/80 Prior BP: 130/70 (10/04/2009)  Prior 10 Yr Risk Heart Disease: N/A (10/24/2006)  Labs Reviewed: K+: 4.8 (08/02/2009) Creat: : 1.1 (08/02/2009)   Chol: 128 (08/02/2009)   HDL: 43.70 (08/02/2009)   LDL: 68 (12/22/2008)   TG: 83.0 (12/22/2008)  Problem # 2:  CORONARY ARTERY DISEASE (ICD-414.00)  grafts are open His updated medication list for this problem includes:    Aspirin 81 Mg Tabs (Aspirin) ..... One daily  Cardiac Cath: yes (11/13/2008)  Labs Reviewed: Chol: 128 (08/02/2009)   HDL: 43.70 (08/02/2009)   LDL: 68 (12/22/2008)   TG: 83.0 (12/22/2008)  Lipid Goals: Chol Goal: 200 (10/24/2006)   HDL Goal: 40 (10/24/2006)   LDL Goal: 100 (10/24/2006)   TG Goal: 150 (10/24/2006)  Problem # 3:  RADICULOPATHY, FOUTH CERVICAL NERVE, LEFT (ICD-723.4) surgery planned based on the results of the myelogram Dr Gerlene Fee has the surgery schedule Nov 4  Complete Medication List: 1)  Aspirin 81 Mg Tabs (Aspirin) .... One daily 2)  Lexapro 10 Mg Tabs (Escitalopram oxalate) .... Take 1 tablet by mouth once a day 3)  Mobic 15 Mg Tabs (Meloxicam) .... As needed 4)  Flomax 0.4 Mg Cp24 (Tamsulosin hcl) .... Q hs   may use generic 5)  Vitamin E 400 Unit Caps (Vitamin e) 6)  Crestor 20 Mg Tabs (Rosuvastatin calcium) .... 1/2 every other day 7)  Tramadol Hcl 50 Mg Tabs (Tramadol hcl) .Marland Kitchen.. 1-2 by mouth q 6 hours as needed pain with 500 mg tylelol  Hypertension Assessment/Plan:      The patient's hypertensive risk group is category C: Target organ damage and/or diabetes.  Today's blood pressure is 130/80.  His blood pressure goal is < 140/90.  Patient Instructions: 1)  cleared for surgery Nov 4 send results of the cardiologist and the cath to the neurosugeon.   Orders Added: 1)  Est. Patient Level IV [41324]  Appended Document: 1 month rov/njr LVM

## 2010-02-15 NOTE — Letter (Signed)
Summary: [facility]  [facility] [place] [facility]   Imported By: Maryln Gottron 06/03/2009 14:37:57  _____________________________________________________________________  External Attachment:    Type:   Image     Comment:   External Document

## 2010-02-15 NOTE — Progress Notes (Signed)
Summary: status of clearance  Phone Note From Other Clinic   Caller: [facility] OFFICE [phone] Request: Talk with Nurse Details of Complaint: status of clearance Initial call taken by: Lorne Skeens,  November 15, 2009 12:00 PM  Follow-up for Phone Call        I spoke with Erie Noe from [facility].  Refaxed phone clearance. Mylo Red RN

## 2010-02-15 NOTE — Letter (Signed)
Summary: [facility]  [facility]   Imported By: Maryln Gottron 04/28/2009 15:22:14  _____________________________________________________________________  External Attachment:    Type:   Image     Comment:   External Document

## 2010-02-15 NOTE — Progress Notes (Signed)
Summary: calling back about test results  Phone Note Call from Patient Call back at Work Phone [phone]   Caller: Spouse/ Millie Summary of Call: Pt wife calling back about test results Initial call taken by: Judie Grieve,  November 03, 2009 8:41 AM  Follow-up for Phone Call        [facility]. Mylo Red RN  Wife, [name], is aware of carotid results. Mylo Red RN

## 2010-02-15 NOTE — Letter (Signed)
Summary: [facility]  [facility] [place]   Imported By: Maryln Gottron 12/21/2009 14:07:37  _____________________________________________________________________  External Attachment:    Type:   Image     Comment:   External Document
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
## 2010-02-15 NOTE — Progress Notes (Signed)
Summary: carotid results  Phone Note Other Incoming   Summary of Call: pt was in today for carotid u/s, results found severe soft plaque in left bulb and ICA ostium, 80-99% LICA stenosis, per Dr Gala Romney refer pt to Dr Early soon, appt sch for Fri 3/25 at 2:30  Follow-up for Phone Call        pts wife aware Meredith Staggers, RN  April 08, 2009 3:02 PM
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
## 2010-02-28 ENCOUNTER — Other Ambulatory Visit: Payer: Self-pay | Admitting: Neurosurgery

## 2010-02-28 DIAGNOSIS — M542 Cervicalgia: Secondary | ICD-10-CM

## 2010-02-28 DIAGNOSIS — G930 Cerebral cysts: Secondary | ICD-10-CM

## 2010-03-14 ENCOUNTER — Ambulatory Visit
Admission: RE | Admit: 2010-03-14 | Discharge: 2010-03-14 | Disposition: A | Discharge status: Home / Self Care | Payer: Medicare Other | Source: Ambulatory Visit | Attending: Neurosurgery | Admitting: Neurosurgery

## 2010-03-14 DIAGNOSIS — M542 Cervicalgia: Secondary | ICD-10-CM

## 2010-03-14 DIAGNOSIS — G930 Cerebral cysts: Secondary | ICD-10-CM

## 2010-03-30 LAB — CBC
HCT: 47.1 % (ref 39.0–52.0)
Hemoglobin: 15.4 g/dL (ref 13.0–17.0)
MCH: 30.3 pg (ref 26.0–34.0)
MCHC: 32.7 g/dL (ref 30.0–36.0)
MCV: 92.5 fL (ref 78.0–100.0)
Platelets: 208 10*3/uL (ref 150–400)
RBC: 5.09 MIL/uL (ref 4.22–5.81)
RDW: 14.3 % (ref 11.5–15.5)
WBC: 6.7 10*3/uL (ref 4.0–10.5)

## 2010-03-30 LAB — SURGICAL PCR SCREEN
MRSA, PCR: NEGATIVE
Staphylococcus aureus: NEGATIVE

## 2010-03-30 LAB — BASIC METABOLIC PANEL
CO2: 30 mEq/L (ref 19–32)
Glucose, Bld: 71 mg/dL (ref 70–99)
Potassium: 4.6 mEq/L (ref 3.5–5.1)
Sodium: 145 mEq/L (ref 135–145)

## 2010-03-30 LAB — PROTIME-INR: INR: 1.08 (ref 0.00–1.49)

## 2010-04-04 LAB — POCT I-STAT, CHEM 8
BUN: 16 mg/dL (ref 6–23)
Calcium, Ion: 1 mmol/L — ABNORMAL LOW (ref 1.12–1.32)
Chloride: 107 meq/L (ref 96–112)
Creatinine, Ser: 1.2 mg/dL (ref 0.4–1.5)
Glucose, Bld: 103 mg/dL — ABNORMAL HIGH (ref 70–99)
HCT: 44 % (ref 39.0–52.0)
Hemoglobin: 15 g/dL (ref 13.0–17.0)
Potassium: 3.5 meq/L (ref 3.5–5.1)
Sodium: 140 meq/L (ref 135–145)
TCO2: 23 mmol/L (ref 0–100)

## 2010-04-04 LAB — COMPREHENSIVE METABOLIC PANEL
ALT: 16 U/L (ref 0–53)
AST: 29 U/L (ref 0–37)
Albumin: 3.6 g/dL (ref 3.5–5.2)
Alkaline Phosphatase: 84 U/L (ref 39–117)
BUN: 14 mg/dL (ref 6–23)
CO2: 25 mEq/L (ref 19–32)
Calcium: 8.3 mg/dL — ABNORMAL LOW (ref 8.4–10.5)
Chloride: 106 mEq/L (ref 96–112)
Creatinine, Ser: 1.15 mg/dL (ref 0.4–1.5)
GFR calc Af Amer: >60 mL/min (ref >60)
GFR calc non Af Amer: >60 mL/min (ref >60)
Glucose, Bld: 102 mg/dL — ABNORMAL HIGH (ref 70–99)
Potassium: 3.3 mEq/L — ABNORMAL LOW (ref 3.5–5.1)
Sodium: 137 mEq/L (ref 135–145)
Total Bilirubin: 0.9 mg/dL (ref 0.3–1.2)
Total Protein: 6.4 g/dL (ref 6.0–8.3)

## 2010-04-04 LAB — CBC
HCT: 38.6 % — ABNORMAL LOW (ref 39.0–52.0)
HCT: 39.8 % (ref 39.0–52.0)
HCT: 41.8 % (ref 39.0–52.0)
Hemoglobin: 13.2 g/dL (ref 13.0–17.0)
Hemoglobin: 13.4 g/dL (ref 13.0–17.0)
Hemoglobin: 14.2 g/dL (ref 13.0–17.0)
MCHC: 34 g/dL (ref 30.0–36.0)
MCHC: 33.7 g/dL (ref 30.0–36.0)
MCHC: 33.9 g/dL (ref 30.0–36.0)
MCV: 93.4 fL (ref 78.0–100.0)
MCV: 93.3 fL (ref 78.0–100.0)
MCV: 93.6 fL (ref 78.0–100.0)
Platelets: 243 10*3/uL (ref 150–400)
Platelets: 202 10*3/uL (ref 150–400)
Platelets: 205 10*3/uL (ref 150–400)
RBC: 4.14 MIL/uL — ABNORMAL LOW (ref 4.22–5.81)
RBC: 4.25 MIL/uL (ref 4.22–5.81)
RBC: 4.49 MIL/uL (ref 4.22–5.81)
RDW: 14.3 % (ref 11.5–15.5)
RDW: 14.3 % (ref 11.5–15.5)
RDW: 14.4 % (ref 11.5–15.5)
WBC: 14.3 10*3/uL — ABNORMAL HIGH (ref 4.0–10.5)
WBC: 7 10*3/uL (ref 4.0–10.5)
WBC: 7.2 10*3/uL (ref 4.0–10.5)

## 2010-04-04 LAB — BASIC METABOLIC PANEL
BUN: 27 mg/dL — ABNORMAL HIGH (ref 6–23)
BUN: 10 mg/dL (ref 6–23)
CO2: 28 mEq/L (ref 19–32)
CO2: 25 mEq/L (ref 19–32)
Calcium: 8.6 mg/dL (ref 8.4–10.5)
Calcium: 8.3 mg/dL — ABNORMAL LOW (ref 8.4–10.5)
Chloride: 103 mEq/L (ref 96–112)
Chloride: 107 mEq/L (ref 96–112)
Creatinine, Ser: 1.11 mg/dL (ref 0.4–1.5)
Creatinine, Ser: 0.98 mg/dL (ref 0.4–1.5)
GFR calc Af Amer: >60 mL/min (ref >60)
GFR calc Af Amer: >60 mL/min (ref >60)
GFR calc non Af Amer: >60 mL/min (ref >60)
GFR calc non Af Amer: >60 mL/min (ref >60)
Glucose, Bld: 113 mg/dL — ABNORMAL HIGH (ref 70–99)
Glucose, Bld: 128 mg/dL — ABNORMAL HIGH (ref 70–99)
Potassium: 4.8 mEq/L (ref 3.5–5.1)
Potassium: 4.4 mEq/L (ref 3.5–5.1)
Sodium: 137 mEq/L (ref 135–145)
Sodium: 137 mEq/L (ref 135–145)

## 2010-04-04 LAB — LACTIC ACID, PLASMA: Lactic Acid, Venous: 1.1 mmol/L (ref 0.5–2.2)

## 2010-04-04 LAB — URINALYSIS, ROUTINE W REFLEX MICROSCOPIC
Glucose, UA: NEGATIVE mg/dL
Hgb urine dipstick: NEGATIVE
Ketones, ur: NEGATIVE mg/dL
Nitrite: NEGATIVE
Protein, ur: NEGATIVE mg/dL
Specific Gravity, Urine: 1.024 (ref 1.005–1.030)
Urobilinogen, UA: 1 mg/dL (ref 0.0–1.0)
pH: 5.5 (ref 5.0–8.0)

## 2010-04-04 LAB — POCT CARDIAC MARKERS
CKMB, poc: 1.8 ng/mL (ref 1.0–8.0)
Myoglobin, poc: 220 ng/mL (ref 12–200)
Troponin i, poc: <0.05 ng/mL (ref 0.00–0.09)

## 2010-04-04 LAB — DIFFERENTIAL
Basophils Absolute: 0.1 10*3/uL (ref 0.0–0.1)
Basophils Relative: 1 % (ref 0–1)
Eosinophils Absolute: 0.1 10*3/uL (ref 0.0–0.7)
Eosinophils Relative: 0 % (ref 0–5)
Lymphocytes Relative: 4 % — ABNORMAL LOW (ref 12–46)
Lymphs Abs: 0.6 K/uL — ABNORMAL LOW (ref 0.7–4.0)
Monocytes Absolute: 1 10*3/uL (ref 0.1–1.0)
Monocytes Relative: 7 % (ref 3–12)
Neutro Abs: 12.6 K/uL — ABNORMAL HIGH (ref 1.7–7.7)
Neutrophils Relative %: 88 % — ABNORMAL HIGH (ref 43–77)

## 2010-04-04 LAB — URINE CULTURE
Colony Count: NO GROWTH
Culture: NO GROWTH

## 2010-04-04 LAB — SAMPLE TO BLOOD BANK

## 2010-04-04 LAB — PROTIME-INR
INR: 1.07 (ref 0.00–1.49)
Prothrombin Time: 13.8 seconds (ref 11.6–15.2)

## 2010-04-06 LAB — BLOOD GAS, ARTERIAL
Acid-Base Excess: 0.6 mmol/L (ref 0.0–2.0)
O2 Saturation: 96.8 %
Patient temperature: 98.6
TCO2: 25.8 mmol/L (ref 0–100)
pCO2 arterial: 39.3 mmHg (ref 35.0–45.0)

## 2010-04-06 LAB — CBC
HCT: 42.7 % (ref 39.0–52.0)
HCT: 44.4 % (ref 39.0–52.0)
Hemoglobin: 14.3 g/dL (ref 13.0–17.0)
MCHC: 33.3 g/dL (ref 30.0–36.0)
MCV: 93.1 fL (ref 78.0–100.0)
MCV: 93.3 fL (ref 78.0–100.0)
Platelets: 190 10*3/uL (ref 150–400)
Platelets: 198 10*3/uL (ref 150–400)
RDW: 13.4 % (ref 11.5–15.5)
WBC: 7.3 10*3/uL (ref 4.0–10.5)

## 2010-04-06 LAB — BASIC METABOLIC PANEL
BUN: 5 mg/dL — ABNORMAL LOW (ref 6–23)
CO2: 25 mEq/L (ref 19–32)
Chloride: 107 mEq/L (ref 96–112)
Glucose, Bld: 131 mg/dL — ABNORMAL HIGH (ref 70–99)
Potassium: 3.5 mEq/L (ref 3.5–5.1)
Sodium: 138 mEq/L (ref 135–145)

## 2010-04-06 LAB — PROTIME-INR: INR: 1.13 (ref 0.00–1.49)

## 2010-04-06 LAB — URINALYSIS, ROUTINE W REFLEX MICROSCOPIC
Glucose, UA: NEGATIVE mg/dL
Ketones, ur: NEGATIVE mg/dL
Nitrite: NEGATIVE
Specific Gravity, Urine: 1.022 (ref 1.005–1.030)
pH: 5 (ref 5.0–8.0)

## 2010-04-06 LAB — COMPREHENSIVE METABOLIC PANEL
Albumin: 3.7 g/dL (ref 3.5–5.2)
BUN: 11 mg/dL (ref 6–23)
Calcium: 8.7 mg/dL (ref 8.4–10.5)
Chloride: 112 mEq/L (ref 96–112)
Creatinine, Ser: 1.07 mg/dL (ref 0.4–1.5)
GFR calc non Af Amer: >60 mL/min (ref >60)
Total Bilirubin: 0.9 mg/dL (ref 0.3–1.2)

## 2010-04-06 LAB — URINE MICROSCOPIC-ADD ON

## 2010-04-06 LAB — TYPE AND SCREEN: ABO/RH(D): O POS

## 2010-04-21 LAB — DIFFERENTIAL
Basophils Absolute: 0 10*3/uL (ref 0.0–0.1)
Eosinophils Absolute: 0.1 10*3/uL (ref 0.0–0.7)
Eosinophils Absolute: 0.1 10*3/uL (ref 0.0–0.7)
Eosinophils Relative: 1 % (ref 0–5)
Lymphocytes Relative: 11 % — ABNORMAL LOW (ref 12–46)
Lymphs Abs: 0.6 10*3/uL — ABNORMAL LOW (ref 0.7–4.0)
Lymphs Abs: 1 10*3/uL (ref 0.7–4.0)
Monocytes Absolute: 0.6 10*3/uL (ref 0.1–1.0)
Monocytes Relative: 7 % (ref 3–12)
Neutrophils Relative %: 80 % — ABNORMAL HIGH (ref 43–77)
Neutrophils Relative %: 82 % — ABNORMAL HIGH (ref 43–77)

## 2010-04-21 LAB — BASIC METABOLIC PANEL
BUN: 11 mg/dL (ref 6–23)
BUN: 9 mg/dL (ref 6–23)
CO2: 26 mEq/L (ref 19–32)
CO2: 28 mEq/L (ref 19–32)
Calcium: 8.2 mg/dL — ABNORMAL LOW (ref 8.4–10.5)
Calcium: 8.6 mg/dL (ref 8.4–10.5)
Chloride: 111 mEq/L (ref 96–112)
Creatinine, Ser: 0.86 mg/dL (ref 0.4–1.5)
Creatinine, Ser: 0.98 mg/dL (ref 0.4–1.5)
Creatinine, Ser: 1.05 mg/dL (ref 0.4–1.5)
GFR calc non Af Amer: >60 mL/min (ref >60)
GFR calc non Af Amer: >60 mL/min (ref >60)
Glucose, Bld: 111 mg/dL — ABNORMAL HIGH (ref 70–99)
Glucose, Bld: 114 mg/dL — ABNORMAL HIGH (ref 70–99)
Glucose, Bld: 125 mg/dL — ABNORMAL HIGH (ref 70–99)
Potassium: 4.1 mEq/L (ref 3.5–5.1)

## 2010-04-21 LAB — LIPID PANEL
HDL: 42 mg/dL (ref >39)
LDL Cholesterol: 67 mg/dL (ref 0–99)
Triglycerides: 68 mg/dL (ref <150)

## 2010-04-21 LAB — CBC
HCT: 41.5 % (ref 39.0–52.0)
HCT: 42 % (ref 39.0–52.0)
Hemoglobin: 14.2 g/dL (ref 13.0–17.0)
MCHC: 33.7 g/dL (ref 30.0–36.0)
MCHC: 34 g/dL (ref 30.0–36.0)
MCV: 94.8 fL (ref 78.0–100.0)
MCV: 95.1 fL (ref 78.0–100.0)
Platelets: 165 10*3/uL (ref 150–400)
Platelets: 181 10*3/uL (ref 150–400)
RBC: 4.42 MIL/uL (ref 4.22–5.81)
RDW: 14.1 % (ref 11.5–15.5)
RDW: 14.3 % (ref 11.5–15.5)
WBC: 6 10*3/uL (ref 4.0–10.5)
WBC: 8.3 10*3/uL (ref 4.0–10.5)

## 2010-04-21 LAB — TSH
TSH: 2.365 u[IU]/mL (ref 0.350–4.500)
TSH: 3.07 u[IU]/mL (ref 0.350–4.500)

## 2010-04-21 LAB — CARDIAC PANEL(CRET KIN+CKTOT+MB+TROPI)
Relative Index: INVALID (ref 0.0–2.5)
Relative Index: INVALID (ref 0.0–2.5)
Total CK: 80 U/L (ref 7–232)
Troponin I: 0.01 ng/mL (ref 0.00–0.06)

## 2010-04-21 LAB — PROTIME-INR
INR: 1.06 (ref 0.00–1.49)
Prothrombin Time: 13.7 seconds (ref 11.6–15.2)

## 2010-04-21 LAB — TROPONIN I: Troponin I: 0.02 ng/mL (ref 0.00–0.06)

## 2010-04-21 LAB — POCT CARDIAC MARKERS: Myoglobin, poc: 68.8 ng/mL (ref 12–200)

## 2010-04-21 LAB — MAGNESIUM: Magnesium: 2.4 mg/dL (ref 1.5–2.5)

## 2010-04-28 ENCOUNTER — Encounter: Payer: Self-pay | Admitting: Internal Medicine

## 2010-05-05 ENCOUNTER — Telehealth: Payer: Self-pay | Admitting: *Deleted

## 2010-05-05 NOTE — Telephone Encounter (Signed)
[facility] Triage Call Report Triage Record Num: [id number] Operator: Martie Lee Long Patient Name: [name] Call Date & Time: 05/04/2010 5:49:31PM Patient Phone: [phone] PCP: Patient Gender: Male PCP Fax : Patient DOB: [birth year] Practice Name: [facility] Reason for Call: Mildred/Wife calling about dizziness and vomiting (x1). Onset since November 2011. Pt has been evaluated for this by his primary care. Wife is calling because pt vomited x 1 today. The dizziness has been on going. Instucted to have seen within 24 hrs and to call office to have a same day appt scheduled. Protocol(s) Used: Dizziness or Vertigo Recommended Outcome per Protocol: See Provider within 24 hours Reason for Outcome: Previously evaluated and worsening symptoms interfering with ability to carry out activities of daily living (ADLs) Unexplained dizziness associated with nausea Care Advice: Call EMS 911 if patient develops confusion, decreased level of consciousness, chest pain, shortness of breath, or focal neurologic abnormalities such as facial droop or weakness of one extremity. ~ ~ SYMPTOM / CONDITION MANAGEMENT A temporary drop in blood pressure sometimes occurs with a quick change to an upright position (postural hypotension) and may cause light-headedness or dizziness. Change position slowly. Making a habit of rising slowly and sitting for a few minutes before standing to walk usually relieves the feeling of faintness. ~ When feeling faint, find a place to lie down if possible, and elevate legs 8 to 12 inches above the heart. If unable to lie down, sit in a chair and lower head between the knees for 3 to 5 minutes. If standing and not able to sit, cross legs and squeeze the knees together to move blood to the heart. ~ 05/04/2010 6:15:40PM Page 1 of 1 [email or web address]

## 2010-05-05 NOTE — Telephone Encounter (Signed)
Talked with wife and pt has ov with dr Lovell Sheehan in am- in bed this am with no complaints-instruction to send to er if conditions worsenes

## 2010-05-06 ENCOUNTER — Encounter: Payer: Self-pay | Admitting: Internal Medicine

## 2010-05-06 ENCOUNTER — Ambulatory Visit (INDEPENDENT_AMBULATORY_CARE_PROVIDER_SITE_OTHER): Payer: Medicare Other | Admitting: Internal Medicine

## 2010-05-06 DIAGNOSIS — I10 Essential (primary) hypertension: Secondary | ICD-10-CM

## 2010-05-06 DIAGNOSIS — R110 Nausea: Secondary | ICD-10-CM

## 2010-05-06 DIAGNOSIS — K219 Gastro-esophageal reflux disease without esophagitis: Secondary | ICD-10-CM

## 2010-05-06 NOTE — Progress Notes (Signed)
  Subjective:    Patient ID: [name], male    DOB: [birth year], 77 y.o.   MRN: [id number]  HPI patient is a pleasant 77 old gentleman unfortunately has severe neck disease and has had neurosurgical intervention.  His persistent pain in his shoulder which was felt to be different from the neurosurgical pain from his neck and he was sent to an orthopedist for injection the shoulder about a week since he injection and he is achieving some pain control now.  He had an episode over the past several days of nausea and vomiting without fever chills or signs of systemic infection I suspect that this was a limited viral infection and unrelated to anything else    Review of Systems  Constitutional: Positive for activity change and appetite change. Negative for fever and fatigue.  HENT: Negative for hearing loss, congestion, neck pain and postnasal drip.   Eyes: Negative for discharge, redness and visual disturbance.  Respiratory: Negative for cough, shortness of breath and wheezing.   Cardiovascular: Negative for leg swelling.  Gastrointestinal: Negative for abdominal pain, constipation and abdominal distention.  Genitourinary: Negative for urgency and frequency.  Musculoskeletal: Positive for myalgias and arthralgias. Negative for joint swelling.  Skin: Negative for color change and rash.  Neurological: Positive for dizziness and weakness. Negative for light-headedness.  Hematological: Negative for adenopathy.  Psychiatric/Behavioral: Negative for behavioral problems.       Objective:   Physical Exam    Blood pressure 130/80, pulse 76, temperature 98.2 F (36.8 C), temperature source Oral, resp. rate 16, height 6' (1.829 m), weight 178 lb (80.74 kg). Physical examination his vital signs are stable he has good coloration he is apparent some discomfort his neck is supple however his lung fields are clear both to auscultation and percussion he has no egophony his heart examination revealed  regular rate and rhythm his abdomen was soft bowel sounds are slightly increased stent examination reveals no edema he is alert and oriented to person place and time    Assessment & Plan:  I think he has 2 different problems that are orthopedic in nature the cervical disease for which Dr. Gerlene Fee had operated and bursitis in her shoulder for which she has received an injection.  We will monitor the effectiveness of the injection on long-term control of his pain he is followed with Dr. Gerlene Fee planned.  The single episode of nausea and vomiting I believe to be self-limited however if it persists or recurs for further evaluation would need to be done I believe you should up with the use of Mobic since it's a long-acting anti-inflammatory and stick with a shorter acting a leave and be sure to take food with any anti-inflammatory that he takes

## 2010-05-16 ENCOUNTER — Other Ambulatory Visit: Payer: Self-pay | Admitting: Neurosurgery

## 2010-05-16 DIAGNOSIS — M47812 Spondylosis without myelopathy or radiculopathy, cervical region: Secondary | ICD-10-CM

## 2010-05-16 DIAGNOSIS — M79606 Pain in leg, unspecified: Secondary | ICD-10-CM

## 2010-05-16 DIAGNOSIS — M79603 Pain in arm, unspecified: Secondary | ICD-10-CM

## 2010-05-19 ENCOUNTER — Ambulatory Visit
Admission: RE | Admit: 2010-05-19 | Discharge: 2010-05-19 | Disposition: A | Discharge status: Home / Self Care | Payer: Medicare Other | Source: Ambulatory Visit | Attending: Neurosurgery | Admitting: Neurosurgery

## 2010-05-19 DIAGNOSIS — M79606 Pain in leg, unspecified: Secondary | ICD-10-CM

## 2010-05-19 DIAGNOSIS — M79603 Pain in arm, unspecified: Secondary | ICD-10-CM

## 2010-05-19 DIAGNOSIS — M47812 Spondylosis without myelopathy or radiculopathy, cervical region: Secondary | ICD-10-CM

## 2010-05-30 ENCOUNTER — Telehealth: Payer: Self-pay | Admitting: Internal Medicine

## 2010-05-30 NOTE — Telephone Encounter (Signed)
Patient needs surgical clearance for shoulder surgery. Taking ASA 81 mg daily. Will route to Dr. Gala Romney & his RN.

## 2010-05-30 NOTE — Telephone Encounter (Signed)
Ok to proceed. Recent cath was ok. Can stop asa if needed.

## 2010-05-30 NOTE — Telephone Encounter (Signed)
Pt wife states that pt is to have shoulder surgery and needs surgical clearance. Dr Rennis Chris is doing the procedure. Needs to go to [facility] [phone] or [phone] fax number [phone]

## 2010-05-31 NOTE — Op Note (Signed)
NAMELENNEX, PIETILA                  ACCOUNT NO.:  [id number]   MEDICAL RECORD NO.:  [id number]          PATIENT TYPE:  AMB   LOCATION:  [facility]                         FACILITY:  [facility]   PHYSICIAN:  Jamison Neighbor, M.D.  DATE OF BIRTH:  09/05/1931   DATE OF PROCEDURE:  09/21/2006  DATE OF DISCHARGE:                               OPERATIVE REPORT   PREOPERATIVE DIAGNOSIS:  Right mid ureteral calculus.   POSTOPERATIVE DIAGNOSIS:  Right mid ureteral calculus.   PROCEDURE:  Cystoscopy, right in situ laser lithotripsy, double-J  catheter exchange.   SURGEON:  Jamison Neighbor, M.D.   ANESTHESIA:  General.   COMPLICATIONS:  None.   DRAINS:  None.   BRIEF HISTORY:  This 77 year old male developed acute right sided colic  with associated infection.  The patient underwent the placement of  double-J catheter to decompress right kidney and has been on antibiotic  therapy.  After several days he had defervesced and was sent home on  oral antibiotic therapy.  He is now to undergo removal of the stone.  The patient understands the risks and benefits of the procedure and gave  full informed consent.   PROCEDURE:  After successful induction of general anesthesia, the  patient was placed in the dorsal position, prepped with Betadine and  draped in the usual sterile fashion.  Cystoscopy was performed, urethra  was visualized in its entirety and found to be normal.  Beyond the  verumontanum there was no significant prostatic outlet obstruction.  The  bladder neck was opened.  The previously placed double-J catheter was  easily identified.  This was grasped and removed a new guidewire was  then passed up to the kidney.  A formal retrograde study was not  performed since the patient had previously undergone those studies when  he had his initial double-J catheter placed.  With the guidewire in  place ureteroscope was advanced up to the stone.  The stone was too  large to extract intact and for  that reason in situ laser lithotripsy  was performed.  The holmium laser was used to easily fragment the stone  into small passable pieces.  The ureteroscope was then advanced all way  to the kidney where no other stones or other irregularities could be  identified.  The wire was left in place, ureteroscope was removed.  A 6-  French x 28 cm double-J was then passed over the wire, allowed to coil  normally within the kidney as well as within the renal pelvis.  The  bladder was drained.  The patient tolerated procedure well and was taken  to recovery room in good condition.      Jamison Neighbor, M.D.  Electronically Signed    RJE/MEDQ  D:  09/21/2006  T:  09/21/2006  Job:  [id number]

## 2010-05-31 NOTE — Assessment & Plan Note (Signed)
OFFICE VISIT   [name], [name]  DOB:  10-24-1931                                       05/14/2009  [id number]#:[id number]   Patient presents today for follow-up of his recent left carotid  endarterectomy for severe carotid stenosis on 04/20/2009.  He did well  and was discharged to home on postoperative day #1.  He has done well  since his discharge as well without any focal neurologic deficits.   His neck incision looks quite good with no evidence of wound problems.  His carotid arteries are without bruits bilaterally.  He has 2+ radial  pulses.   I am quite pleased with his initial result and plan to see him again in  6 months with repeat carotid duplex follow-up.  He will notify us should  he develop any neurologic deficits or wound problems in the interim.     Larina Earthly, M.D.  Electronically Signed   TFE/MEDQ  D:  05/14/2009  T:  05/18/2009  Job:  [id number]   cc:   Stacie Glaze, MD  Bevelyn Buckles. Bensimhon, MD

## 2010-05-31 NOTE — Procedures (Signed)
DUPLEX ULTRASOUND OF ABDOMINAL AORTA   INDICATION:  Followup abdominal aortic aneurysm.   HISTORY:  Diabetes:  No.  Cardiac:  Yes.  Hypertension:  Yes.  Smoking:  Quit in 1974.  Connective Tissue Disorder:  Family History:  Previous Surgery:   DUPLEX EXAM:         AP (cm)                   TRANSVERSE (cm)  Proximal             3.02 cm                   3.11 cm  Mid                  3.82 cm                   3.78 cm  Distal               2.48 cm                   2.47 cm  Right Iliac          1.29 cm                   1.58 cm  Left Iliac           1.69 cm                   1.55 cm   PREVIOUS:  Date:  AP:  4.4 (per CT)  TRANSVERSE:   IMPRESSION:  Abdominal aortic aneurysm noted with the largest  measurement of 3.82 cm x 3.78 cm.   ___________________________________________  Larina Earthly, M.D.   MG/MEDQ  D:  11/29/2007  T:  11/29/2007  Job:  [id number]

## 2010-05-31 NOTE — Procedures (Signed)
DUPLEX ULTRASOUND OF ABDOMINAL AORTA   INDICATION:  Abdominal aortic aneurysm.   HISTORY:  Diabetes:  No.  Cardiac:  Yes.  Hypertension:  Yes.  Smoking:  Previous.  Connective Tissue Disorder:  Family History:  Previous Surgery:  No.   DUPLEX EXAM:         AP (cm)                   TRANSVERSE (cm)  Proximal             Not visualized            Not visualized  Mid                  2.7 cm                    2.7 cm  Distal               3.8 cm                    3.5 cm  Right Iliac          1.1 cm                    1.2 cm  Left Iliac           1.2 cm                    1.1 cm   PREVIOUS:  Date:  11/27/2008  AP:  3.5  TRANSVERSE:  3.6   IMPRESSION:  1. Aneurysmal dilatation of the distal abdominal aorta noted with no      significant change in the maximum diameter when compared to the      previous exam.  2. No evidence of increased velocities noted in the abdominal aorta      and proximal common iliac arteries.  3. Unable to adequately visualize the proximal abdominal aorta due to      overlying bowel gas patterns.   ___________________________________________  Larina Earthly, M.D.   CH/MEDQ  D:  12/14/2009  T:  12/14/2009  Job:  [id number]

## 2010-05-31 NOTE — Op Note (Signed)
[name], [name]                  ACCOUNT NO.:  [id number]   MEDICAL RECORD NO.:  [id number]          PATIENT TYPE:  INP   LOCATION:  1440                         FACILITY:  [facility]   PHYSICIAN:  Excell Seltzer. Annabell Howells, M.D.    DATE OF BIRTH:  06-18-1931   DATE OF PROCEDURE:  09/10/2006  DATE OF DISCHARGE:                               OPERATIVE REPORT   PROCEDURE:  Cystoscopy with right double-J stent insertion.   PREOPERATIVE DIAGNOSIS:  Right mid ureteral stone with fever.   POSTOPERATIVE DIAGNOSIS:  Right mid ureteral stone with fever.   SURGEON:  Dr. Bjorn Pippin.   ANESTHESIA:  General.   DRAIN:  6-French x 26 cm right double-J stent.   COMPLICATIONS:  None.   INDICATIONS:  [name] is a 75-hour-old white male patient of Dr. Eudelia Bunch who presented with 2-week history of right flank pain and the  onset yesterday with fever.  CT scan in the ER demonstrated a 7 mm right  mid ureteral stone with hydronephrosis.  It was felt that stenting was  indicated.   FINDINGS AND PROCEDURE:  The patient was taken to operating room.  He  had received Rocephin and gentamicin preoperatively.  He was fitted with  PAS hose. General anesthetic was induced.  He was placed in lithotomy  position.  His perineum and genitalia were prepped with Betadine  solution.  He was draped usual sterile fashion.  Cystoscopy performed  using a 22-French scope and 12 and 70 degrees lenses.  Examination  revealed a normal urethra.  The external sphincter was intact.  The  prostatic urethra was consistent with prior TURP.  Examination of  bladder revealed moderate trabeculation.  No tumor, stones or  inflammation were noted.  Ureteral orifices were unremarkable.   The right ureteral orifice was cannulated with a Sensor guidewire which  was passed up to the level of stone in the mid ureter.  Slight force was  required to bypass the stone.  The wire was then passed to the kidney  without difficulty.  A 6-French 26 cm  double-J stent was passed over the  wire to the kidney under fluoroscopic guidance.  The wire was removed  leaving good coil in the kidney, good coil in the bladder.  Bloody urine  effluxed from the stent.  The bladder was then drained.  The patient was  taken down from lithotomy position.  His anesthetic was reversed.  He  was removed to the recovery room in stable condition.  There were no  complications.     Excell Seltzer. Annabell Howells, M.D.  Electronically Signed    JJW/MEDQ  D:  09/10/2006  T:  09/11/2006  Job:  [id number]

## 2010-05-31 NOTE — Assessment & Plan Note (Signed)
OFFICE VISIT   [name], [name]  DOB:  [birth year]                                       04/09/2009  ZOXWR#:[id number]   The patient presents today for evaluation of recent discovery of a  severe asymptomatic left internal carotid artery stenosis.  He is known  to me from a followup of a small abdominal aortic aneurysm.  Most recent  was in November 2010.  He had recently undergone duplex followup of an  asymptomatic carotid bruit was found to have no significant right  carotid stenosis and severe left internal carotid artery stenosis.  He  denies any prior episodes of the aphasia or right-sided weakness.  Interestingly, he has had a recent blindness in his left eye within the  last 1-2 years.  This was felt not to be related to optic nerve  microvascular supply issues.  I am unclear but this potentially could  have been related to retinal embolus as well.   PAST MEDICAL HISTORY:  Documented in his chart.   REVIEW OF SYSTEMS:  Reviewed and unchanged from before.   PHYSICAL EXAMINATION:  Well-developed, well-nourished white male  appearing stated age.  Blood pressure is 147/76, pulse 66, respirations  18.  His oxygen saturation is 98%.  His temperature is 98.0.  HEENT is  normal.  He does have marked diminished vision in his left eye.  His  chest is clear bilaterally.  Heart is regular rate and rhythm without  murmur.  He does have a soft left bruit but no bruit on his right  carotid system.  Has 2+ radial pulses.  Abdominal exam is soft,  nontender.  Musculoskeletal:  No major deformity or cyanosis.  Neurologic:  No focal weakness or paresthesia.  Skin:  Without ulcers or  rashes.   He did undergo a confirmatory duplex in our office to determine if he  was a candidate for endarterectomy on the basis of duplex alone.  This  does show a severe stenosis of his left internal carotid artery, greater  than 80%.  He does have a normal internal carotid artery  distal to the  stenosis.  There is moderate tortuosity.  I discussed options with the  patient and his wife.  I have recommended that he undergo endarterectomy  for reduction of stroke risk.  I explained the procedure including a  potential for 1-2% risk of stroke with surgery.  He understands this is  typically an overnight admission.  He wished to proceed with this as  soon as possible.  We have scheduled him for surgery on 04/19/2009 at  [facility].     Larina Earthly, M.D.  Electronically Signed   TFE/MEDQ  D:  04/09/2009  T:  04/12/2009  Job:  [id number]   cc:   Bevelyn Buckles. Bensimhon, MD  Stacie Glaze, MD

## 2010-05-31 NOTE — Assessment & Plan Note (Signed)
OFFICE VISIT   CAPONI, [name]  DOB:  May 25, 1931                                       11/29/2007  [id number]#:[id number]   The patient presents today for continued followup of his infrarenal  abdominal aortic aneurysm.  He has had no new difficulties.  He  continues to be a nonsmoker.  Does not drink alcohol.  He did have a  renal stone, but none since his last admission approximately 2 years  ago.  Otherwise, he does quite well with no major new medical problems.  He has no abdominal pain.   PHYSICAL EXAM:  Well-developed, well-nourished white male appearing  stated age of 69.  His radial pulses are 2+.  He has 2+ femoral pulses.  Abdominal exam reveals prominent aortic pulsation with no evidence of  tenderness.   He underwent ultrasound today which shows maximum size of 3.8 cm.  He  had a CT scan several years ago showing inflammatory changes around his  aneurysm and subsequent CT 1 year ago showing marked resolution of this.  I reassured the patient and his wife that he does not have any evidence  of growth in his aneurysm.  We have recommended continued followup only  with ultrasound on a yearly basis.  He is to have no restrictions as far  as activity regarding this.  Plan to see him again in 1 year.   Larina Earthly, M.D.  Electronically Signed   TFE/MEDQ  D:  11/29/2007  T:  12/02/2007  Job:  [id number]   cc:   Stacie Glaze, MD

## 2010-05-31 NOTE — Telephone Encounter (Signed)
Received clearance form from [facility], Dr Gala Romney completed form, form and this note faxed to them

## 2010-05-31 NOTE — Procedures (Signed)
CAROTID DUPLEX EXAM   INDICATION:  Status post left carotid endarterectomy.   HISTORY:  Diabetes:  No.  Cardiac:  Yes.  Hypertension:  Yes.  Smoking:  Previous.  Previous Surgery:  Left carotid endarterectomy on 04/20/2009.  CV History:  Currently asymptomatic.  Amaurosis Fugax No, Paresthesias No, Hemiparesis No                                       RIGHT             LEFT  Brachial systolic pressure:         138               146  Brachial Doppler waveforms:         Normal            Normal  Vertebral direction of flow:        Antegrade         Antegrade  DUPLEX VELOCITIES (cm/sec)  CCA peak systolic                   57                59  ECA peak systolic                   47                82  ICA peak systolic                   49                74  ICA end diastolic                   19                28  PLAQUE MORPHOLOGY:                  Heterogeneous  PLAQUE AMOUNT:                      Minimal           None  PLAQUE LOCATION:                    Distal common carotid artery   IMPRESSION:  1. Patent left carotid endarterectomy site with no bilateral internal      carotid artery stenoses.  2. Significant improvement of the left internal carotid artery      velocities when compared to the previous exam on 04/09/2009.   ___________________________________________  Larina Earthly, M.D.   CH/MEDQ  D:  12/14/2009  T:  12/14/2009  Job:  [id number]

## 2010-05-31 NOTE — Assessment & Plan Note (Signed)
OFFICE VISIT   [name], [name]  DOB:  1931-10-01                                       11/30/2006  EAVWU#:[id number]   [name] presents today for continued follow-up of asymptomatic  abdominal aortic aneurysm.  I had seen him in consultation at [facility] on 09/10/06.  At that time he was admitted with partially  obstructed  right ureteral stone.  He underwent a CT scan at that time,  showed an inflammatory aneurysm.  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  I had  explained to [name] and his wife at the time that I felt this was  comfortable with a continued close observation on this but was somewhat  concerned regarding the growth. I suspect this was related to  inflammation and not actual aneurysmal growth.  Since that time he has  done well with no new major medical difficulties.  Did have coronary  artery bypass graft in 1995 and has not had myocardial infarction or  congestive heart failure.  He is married, does not smoke, having quit in  1974 and does not drink alcohol on a regular basis.  Does have loss of  site in his left eye from neurologic condition one year ago.   PHYSICAL EXAMINATION:  Well-developed, well-nourished white male  appearing stated age of 32.  Blood pressure is 136/80, pulse 60,  respirations 18, O2 saturations are 98% on room air.  His abdominal exam  reveals no tenderness and do no palpate an aneurysm.  He has 2+ femoral,  2+ popliteal pulses, no evidence of peripheral artery aneurysms.  He  underwent a CT scan today and I have reviewed this with him.  This shows  maximal diameter decreased down to 3.7 from 4.4 with improvement of the  surrounding inflammation.  I am quite reassured with this and will  see him in one year with repeat ultrasound in our office.  I again  discussed symptoms  of aneurysmal disease with him and they will notify  me should he develop any symptoms.   Larina Earthly, M.D.  Electronically Signed   TFE/MEDQ  D:  11/30/2006  T:  12/03/2006  Job:  [id number]   cc:   Stacie Glaze, MD  Jamison Neighbor, M.D.

## 2010-05-31 NOTE — Procedures (Signed)
CAROTID DUPLEX EXAM   INDICATION:  Carotid artery disease.   HISTORY:  Diabetes:  No.  Cardiac:  Yes.  Hypertension:  Yes.  Smoking:  Previous.  Previous Surgery:  No carotid artery surgery.  CV History:  Dizziness, headaches, and felt like bees in head.  Amaurosis Fugax No, Paresthesias No, Hemiparesis No.                                       RIGHT             LEFT  Brachial systolic pressure:  Brachial Doppler waveforms:  Vertebral direction of flow:                          Antegrade  DUPLEX VELOCITIES (cm/sec)  CCA peak systolic                                     90  ECA peak systolic                                     110  ICA peak systolic                                     343  ICA end diastolic                                     124  PLAQUE MORPHOLOGY:                                      Homogenous  PLAQUE AMOUNT:                                        Severe  PLAQUE LOCATION:                                      Proximal ICA   IMPRESSION:  1. Left side only study due to repeating.  2. Left internal carotid artery shows evidence of 80-99% stenosis with      focalized homogenous plaque in the proximal internal carotid      artery.  3. Left proximal subclavian artery appears fairly normal with      velocities of 225 cm/s.   ___________________________________________  Larina Earthly, M.D.   AS/MEDQ  D:  04/09/2009  T:  04/09/2009  Job:  [id number]
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
## 2010-05-31 NOTE — Procedures (Signed)
DUPLEX ULTRASOUND OF ABDOMINAL AORTA   INDICATION:  Followup abdominal aortic aneurysm.   HISTORY:  Diabetes:  No.  Cardiac:  Yes.  Hypertension:  Yes.  Smoking:  Previous.  Connective Tissue Disorder:  Family History:  No.  Previous Surgery:  No.   DUPLEX EXAM:         AP (cm)                   TRANSVERSE (cm)  Proximal             1.56 cm                   1.37 cm  Mid                  2.41 cm                   2.50 cm  Distal               3.52 cm                   3.61 cm  Right Iliac          1.34 cm                   1.47 cm  Left Iliac           1.28 cm                   1.37 cm   PREVIOUS:  Date:  11/29/2007  AP:  3.82  TRANSVERSE:  3.78   IMPRESSION:  1. Stable abdominal aortic aneurysm with largest measurement of 3.52      cm x 3.61 cm.  2. Note:  Technical limitations due to bowel gas.   ___________________________________________  Larina Earthly, M.D.   AS/MEDQ  D:  11/27/2008  T:  11/28/2008  Job:  [id number]

## 2010-05-31 NOTE — Assessment & Plan Note (Signed)
OFFICE VISIT   [name], [name]  DOB:  02/24/1931                                       11/27/2008  [id number]#:[id number]   The patient presents today for follow-up of his asymptomatic infrarenal  abdominal aortic aneurysm.  He remains asymptomatic, based on his  aneurysm.  He has no new medical difficulties.  He does not have any  abdominal pain.   PHYSICAL EXAM:  VITAL SIGNS:  Blood pressure is 128/47, heart rate 59,  temperature 98.0.  GENERAL:  He is well-nourished in no distress.  HEENT:  Pupils are equal, round, react to light.  He has no scleral  icterus.  NECK:  He has no bruits.  He has no cervical adenopathy.  No JVD.  LUNGS:  Clear bilaterally with no rales, rhonchi or wheezes.  CARDIOVASCULAR:  Regular rate and rhythm without murmurs.  He does have  2+ radial, 2+ femoral and 2+ posterior tibial pulses bilaterally.  ABDOMEN:  Soft, nontender.  I do not palpate an aneurysm and he has no  masses.  MUSCULOSKELETAL:  No major deformities.  He does have some  telangiectasia over his lower extremities bilaterally with some changes  of venous hypertension.  NEUROLOGY:  Deficits none.  Does have some hoarseness.  He is concerned  regarding his lower extremity cyanosis and I explained this is related  to venous hypertension, not arterial insufficiency, that there is no  risk from this.  I discussed the significance of his ultrasound today.  He does have no significant change with maximal diameter by ultrasound  being 3.6 cm, the last predicted size was 3.8 cm.  I explained he should  not have to have any restriction of activity related to this and he will  continue usual activity.  I explained the potential for a slow growth of  this and he understands, we will continue to follow him on a yearly  basis and have ordered another ultrasound in 1 year.  We will follow up  at that time.   Larina Earthly, M.D.  Electronically Signed   TFE/MEDQ  D:  11/27/2008   T:  11/30/2008  Job:  [id number]   cc:   Stacie Glaze, MD

## 2010-05-31 NOTE — H&P (Signed)
[name], [name]                  ACCOUNT NO.:  [id number]   MEDICAL RECORD NO.:  [id number]          PATIENT TYPE:  INP   LOCATION:  1440                         FACILITY:  [facility]   PHYSICIAN:  Excell Seltzer. Annabell Howells, M.D.    DATE OF BIRTH:  June 20, 1931   DATE OF ADMISSION:  09/10/2006  DATE OF DISCHARGE:                              HISTORY & PHYSICAL   CHIEF COMPLAINT:  Right flank pain.   HISTORY OF PRESENT ILLNESS:  Mr. Loeber is a 77 year old white male with  a history of urolithiasis who was last treated by Dr. Logan Bores in May with  lithotripsy and stenting.  He now has a 2-week history of somewhat  migratory low back and flank pain right worse than left of moderate  severity.  The pain has been associated with intermittent gross  hematuria, and yesterday developed a fever to 102.5.  He was sent to the  emergency room where a CT scan demonstrated a 6 mm right mid ureteral  stone with mild hydronephrosis, bilateral renal calculi, and a 4.4 cm  aneurysm.  The aneurysm had some inflammatory characteristics so CT with  contrast is obtained, and Dr. Arbie Cookey was consulted.  The patient does  report some increased frequency, urgency, and dysuria.  His temperature  in the emergency room was 101.5.   ALLERGIES:  Pertinent for allergies to VIBRAMYCIN, CODEINE, and POSSIBLY  CIPRO.  He got erythema at the IV site in the ER.   CURRENT MEDICATIONS:  Crestor, Lexapro, vitamin C, vitamin E and D, and  aspirin.   PAST MEDICAL HISTORY:  Pertinent for hypercholesterolemia, anxiety,  history of stones, and history of throat cancer.   PAST SURGICAL HISTORY:  Pertinent for lithotripsy with stenting as noted  above, cholecystectomy.   SOCIAL HISTORY:  Negative for tobacco and alcohol since 1964.   FAMILY HISTORY:  Unremarkable.   REVIEW OF SYSTEMS:  He does report some weight loss with diet.  He  denies chest pain, shortness of breath.  He has had occasional nausea  and has had some diarrhea with two  relatively severe episodes.  He has  some mild lower extremity edema intermittently the patient states  according to his wife.  He does have a history of anxiety.  Denies  headaches.  He does have some nasal congestion.  He has leg pain with  walking, possibly claudication.  He denies any lower extremity numbness.  He denies any adenopathy.  He is, otherwise, entirely without complaints  except as above.   PHYSICAL EXAMINATION:  VITAL SIGNS:  Temperature is 98.2, pulse 63,  respirations 20, blood pressure 113/60.  GENERAL:  He is a well-developed, well-nourished white male in no acute  distress, alert and oriented x3.  HEENT:  Head, face normocephalic, atraumatic.  NECK:  Supple without mass or bruit.  LUNGS:  Clear to auscultation with normal effort.  HEART:  Regular rate and rhythm.  ABDOMEN:  Soft, flat with right lower quadrant, right CVA tenderness.  No mass, hepatosplenomegaly or hernias noted.  LYMPHADENOPATHY:  He has no cervical axillary or inguinal adenopathy.  GU/RECTAL:  Deferred.  EXTREMITIES:  Have full range of motion without edema.  NEUROLOGICAL:  Had no focal deficits.  SKIN:  Warm and dry.   Urine today has 7-10 white cells, too numerous to count red cells, and  rare bacteria.  CBC has white count 7.8, hemoglobin 12.1, hematocrit  35.1, platelet count 2007.  BMP is normal with creatinine 1.3.  Urine  culture is pending.   I have reviewed the CT scan films and reports.  The results are noted  above.   IMPRESSION:  1. Symptomatic right mid ureteral stone with fever.  2. Abdominal aortic aneurysm.  3. Bilateral renal calculi.   PLAN:  I am going to get him set up for cystoscopy and stent placement  later today.  He has been started on Rocephin and gentamicin for broad-  spectrum antibiotic coverage pending his culture.  The risks of  cystoscopy and stenting were explained.      Excell Seltzer. Annabell Howells, M.D.  Electronically Signed     JJW/MEDQ  D:  09/10/2006  T:   09/10/2006  Job:  [id number]

## 2010-05-31 NOTE — Consult Note (Signed)
NAMEKINAN, SAFLEY                  ACCOUNT NO.:  [id number]   MEDICAL RECORD NO.:  [id number]          PATIENT TYPE:  INP   LOCATION:  1440                         FACILITY:  [facility]   PHYSICIAN:  Larina Earthly, M.D.    DATE OF BIRTH:  27-Nov-1931   DATE OF CONSULTATION:  09/10/2006  DATE OF DISCHARGE:                                 CONSULTATION   [name] is a 77 year old gentleman who was admitted to [facility] with a symptomatic partially-obstructed right ureteral stone.  He does have a history of prior kidney stones and has undergone  lithotripsy in the past.  He presented with fever, hematuria, and right  lower quadrant abdominal pain.  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  He had  no evidence of leak.   PAST MEDICAL HISTORY:  Significant for coronary artery bypass grafting,  surgery for throat cancer.   SOCIAL HISTORY:  He does not smoke or drink alcohol on a regular basis.   FAMILY HISTORY:  Significant for a brother with aneurysm repair in the  past.   PHYSICAL EXAMINATION:  Well-developed, well-nourished white male in no  acute distress.  His temperature is 98.4, his vital signs are stable.  His abdominal exam reveals no tenderness.  I do not palpate a pulsatile  mass.  He has 2+ femoral, 2+ popliteal, and 2+ dorsalis pedis pulses  with no evidence of peripheral aneurysm.  He has 2+ radial pulses.   I have reviewed his CT with Mr. Force and with his family.  Of note, he  did have a CT scan in April 2008.  I explained there had been a marked  change in his aneurysm in 4 months.  He did have a 3.2-cm small aortic  aneurysm in April with no inflammatory change.  His current CT scan  suggests that the actual aortic size is comparable to his study in April  but now he has a rind around the aorta with the maximal diameter  including this inflammatory change of 4.4 cm.  I explained to Mr.  Keir family that he does not need to restrict his activities or  positioning, etc.  I will see him in 3 months with repeat CT scan for  further evaluation of his inflammatory process and to rule out  enlargement of his aorta.  I explained symptoms of leaking aneurysm and  they will notify us immediately should this occur.      Larina Earthly, M.D.  Electronically Signed     TFE/MEDQ  D:  09/10/2006  T:  09/11/2006  Job:  [id number]   cc:   Excell Seltzer. Annabell Howells, M.D.  Fax: [phone]   Stacie Glaze, MD  [address] [place]  [place] [address]

## 2010-06-03 NOTE — Op Note (Signed)
[name], [name]                  ACCOUNT NO.:  [id number]   MEDICAL RECORD NO.:  [id number]          PATIENT TYPE:  AMB   LOCATION:  NESC                         FACILITY:  [facility]   PHYSICIAN:  Jamison Neighbor, M.D.  DATE OF BIRTH:  03/02/1931   DATE OF PROCEDURE:  05/01/2006  DATE OF DISCHARGE:                               OPERATIVE REPORT   PREOPERATIVE DIAGNOSIS:  Bilateral mid ureteral calculi.   POSTOPERATIVE DIAGNOSIS:  Bilateral mid ureteral calculi.   PROCEDURE:  Cystoscopy, bilateral retrogrades with interpretation,  bilateral ureteroscopy, bilateral in situ laser lithotripsy and  bilateral double-J catheter insertion.   SURGEON:  Jamison Neighbor, M.D.   ANESTHESIA:  General.   COMPLICATIONS:  None.   DRAINS:  Bilateral 26 cm double-J catheters.   HISTORY:  This 77 year old male has a history of calculi and presented  to the office on April 24, 2006, with a recent problem with hematuria.  A CT stone demonstrated bilateral stones in the mid ureter, on the left-  hand side the stone was 5.9 mm, on the right-hand side it was of a very  comparable size.  The patient is to undergo cystoscopy and stent  placement with possible removal of the stones.  If the stones could not  be removed, he understands that he may need to undergo ESWL.  The  patient understands the risks and benefits of the procedure and gave  full informed consent.   PROCEDURE:  After successful induction of general anesthesia, the  patient was placed in the dorsal lithotomy position, prepped with  Betadine and draped in the usual sterile fashion.  Cystoscopy was  performed.  Urethra was visualized in its entirety and found to be  normal.  Beyond the verumontanum, he had a nice open prostate following  a past TURP.  The bladder was inspected.  No tumors or stones could be  seen, ureter orifices were normal in configuration and location.  Retrogrades were performed on the left-hand side through a  6-French  ureteral catheter.   Left retrograde pyelogram:  The patient had contrast injected through  the 6-French ureteral catheter.  The distal ureter was found to be  normal.  The mid ureter close to where the psoas muscle could be  identified.  The patient was found to have a filling defect, the ureter  above that was somewhat dilated.  The remainder of the ureter was normal  in its appearance aside from dilation.  There were no filling defects or  tumors of any kind could be seen.  Collecting system was normal aside  from moderate dilation.   After completion of the left retrograde pyelogram a guidewire was passed  up to the kidney and allowed to coil normally in the renal pelvis.  The  ureteroscope was inserted and was advanced along the wire until the  stone could be identified.  The stone was grasped with basket but could  not come out.  For that reason the basket was cut and extracted.  The  laser fiber was then inserted but the stone migrated proximally.  For  that reason, the short ureteroscope was switched out for a long  ureteroscope.  That ureteroscope was inserted and was advanced up to the  stone.  The stone was fragmented.  Some the fragments did migrated up  into the kidney but were small and should pass without much difficulty.  The ureteroscope was withdrawn under direct vision.  A 6-French x 26 cm  double-J catheter was then advanced over the existing guide wire which  was back-loaded through the scope.  The device was allowed to coil  within the upper pole as well as within the bladder.  Attention was then  directed to the right-hand side.  A 6-French ureteral catheter was also  inserted using a guidewire.   Right retrograde pyelogram:  The distal ureter was normal in its  appearance.  The contrast delineated a stone in a nearly identical  procedure to what had been seen on the opposite side.  On the right  side, however, there was a little bit more in the way of  dilation.  No  other filling defects could be identified.  The patient had no evidence  of irregularities within the collecting system, although as noted above,  it was dilated due to the stone.   After completion of the retrograde pyelogram, a guidewire was passed up  to the kidney and allowed to coil normally in the renal pelvis.  On the  right-hand side, it was difficult to insert a double-J catheter so the  ureter was dilated with the ureteral access sheath.  The short  ureteroscope was then advanced until the stone could be identified.  As  happened on the right-hand side, the kidney went all way up into the  kidney not long after the initiation of fragmentation.  The stone had  certainly broken into several pieces, it should be small enough to pass,  however, the long ureteroscope could not be advanced into that more  proximal ureter for additional fragmentation.  The patient will have a  double-J passed and will undergo follow-up imaging studies in the  office.  If any of the fragments look too large to pass or do not pass  over time, we will treat that with ESWL, however, it is felt that on  both sides there was adequate fragmentation, the stone fragment should  be easily pass.  The patient will be given a prescription for Lorcet  Plus to take as needed for pain, Pyridium Plus for any stent discomfort,  UroXatral 10 mg at night to help with stone fragment passage and Septra  DS until the stents are removed.           ______________________________  Jamison Neighbor, M.D.  Electronically Signed     RJE/MEDQ  D:  05/01/2006  T:  05/01/2006  Job:  [id number]   cc:   Stacie Glaze, MD  [address] [place]  [place] [address]

## 2010-06-03 NOTE — H&P (Signed)
[name], [name]                            ACCOUNT NO.:  [id number]   MEDICAL RECORD NO.:  [id number]                   PATIENT TYPE:  INP   LOCATION:  1831                                 FACILITY:  [facility]   PHYSICIAN:  Bruce Rexene Edison. Swords, M.D. [facility]           DATE OF BIRTH:  1931-03-15   DATE OF ADMISSION:  03/06/2002  DATE OF DISCHARGE:                                HISTORY & PHYSICAL   CHIEF COMPLAINT:  Nausea, vomiting, and diarrhea.   HISTORY OF PRESENT ILLNESS:  The patient is a 77 year old male in his usual  state of health until early this morning.  At that time, he developed acute  onset of nausea, vomiting, and diarrhea.  Today, he also noted some chills  but no documented fever until he came to the emergency department.  He  apparently had an episode of being disoriented and was transferred by EMS to  the hospital.  He states that he ate lasagne last night which was cooked by  a neighbor.  He awoke this morning at 2 o'clock with nausea, vomiting, and  diarrhea.  He had not noted any GI blood loss.  He denies any abdominal  pain.   PAST MEDICAL HISTORY:  His past medical history is significant for a bypass  surgery in 1995.  He had throat cancer with surgery and radiation therapy in  1979.  He has apparently has DJD of the neck.  He also had cholecystectomy,  kidney stones-one of which required a ureteral stent.   MEDICATIONS:  Vioxx and aspirin.   ALLERGIES:  CODEINE and ERYTHROMYCIN.   SOCIAL HISTORY:  Lives with his wife who is recently home from the hospital,  quite ill.  He smoked up until March of 1975.  He does not drink any alcohol  currently.   FAMILY HISTORY:  Noncontributory.   REVIEW OF SYMPTOMS:  He denies any chest pain, shortness of breath, PND,  orthopnea, or any other complaints in review of systems.   PHYSICAL EXAMINATION:  VITAL SIGNS:  Temperature 102.3, respirations 20,  heart rate 117, blood pressure 103/46 on admission to the emergency  room  with a pulse oximetry of 95%.  At the time of my examination, respirations  were 14, heart rate was 92.  GENERAL:  He appears as an elderly male in no acute distress.  Mildly ill.  HEENT:  Normocephalic and atraumatic.  Extraocular movements intact.  Conjunctivae is moist as is oropharynx.  NECK:  Supple without lymphadenopathy, thyromegaly, or jugular venous  distention.  CHEST:  There is no increased work of breathing.  Clear to auscultation  without rhonchi.  CARDIOVASCULAR:  S1 and S2 are normal.  ABDOMEN:  Active bowel sounds, soft, nontender, and nondistended.  There is  no hepatosplenomegaly.  No masses are palpated.  EXTREMITIES:  There is no clubbing, cyanosis, or edema.  NEUROLOGICAL:  He is alert and oriented without  any motor or sensory  deficits.   LABORATORY DATA:  White count 6.2, hemoglobin 15.1, platelets 172,000.  Neutrophils are 98%.  Other laboratories are pending including CMET and U[facility].   ASSESSMENT/PLAN:  Acute gastroenteritis with fever:  Symptoms sound more  like food poisoning but could be viral or bacterial.  He does not have an  elevated white count but does have a left shift.  I think for now we will  watch overnight and see how things settle out.  He is doing much better  after minimal hydration.  We will continue intravenous hydration.  Clear  liquid diet.  We will monitor his temperature and obtain blood and urine  cultures.  His other medical problems appear stable.  It is worth noting  that Vioxx is a relatively new medication and could be contributing to the  symptoms and we will hold those medicines for now.                                               Bruce Rexene Edison Swords, M.D. [facility]    BHS/MEDQ  D:  03/06/2002  T:  03/06/2002  Job:  [id number]   cc:   Stacie Glaze, M.D. [facility]

## 2010-06-03 NOTE — Discharge Summary (Signed)
   NAMENILSON, [name]                            ACCOUNT NO.:  [id number]   MEDICAL RECORD NO.:  [id number]                   PATIENT TYPE:  INP   LOCATION:  6529                                 FACILITY:  [facility]   PHYSICIAN:  Rene Paci, M.D. [facility]          DATE OF BIRTH:  08-Aug-1931   DATE OF ADMISSION:  03/06/2002  DATE OF DISCHARGE:  03/07/2002                                 DISCHARGE SUMMARY   DISCHARGE DIAGNOSES:  1. Probable viral gastroenteritis.  2. Hyperkalemia.   HISTORY OF PRESENT ILLNESS:  The patient is a 77 year old white male who  presented with a greater than 20-hour history of nausea, vomiting, and  diarrhea.  He also complained of fever.  He had had some disorientation as  well.  He states that he ate some lasagna the night before and has felt  poorly since then.   PAST MEDICAL HISTORY:  1. Coronary artery disease, status post CABG in 1995.  2. Throat cancer, status post surgery and radiation in 1979.  3. Degenerative joint disease of the neck.  4. Status post cholecystectomy.  5. History of nephrolithiasis, status post ureteral stent.   HOSPITAL COURSE:  #1 - GASTROINTESTINAL:  The patient presented with less  than a 24-hour history of nausea, vomiting, and diarrhea.  There was concern  for food poisoning versus whether this was a viral or bacterial illness.  The patient was aggressively hydrated and given antiemetics.  The patient  has symptomatically improved.  He was given clear liquids, which he  tolerated and his diet was slowly advanced.  We suspect that this was  probably viral and has subsequently resolved.   #2 - MILD HYPERKALEMIA:  The potassium was 3.4.  Likely secondary to  diarrhea.   LABORATORY DATA:  At discharge, the CBC was normal.  Potassium 3.4.  The  urinalysis was negative.   DISCHARGE MEDICATIONS:  He was instructed to resume his home medications,  which were Vioxx and aspirin.   FOLLOW-UP:  To follow up with Stacie Glaze,  M.D., in two to three weeks.     Cornell Barman, P.A. [facility]                  Rene Paci, M.D. [facility]    LC/MEDQ  D:  03/07/2002  T:  03/08/2002  Job:  [id number]   cc:   Stacie Glaze, M.D. [facility]

## 2010-06-03 NOTE — Discharge Summary (Signed)
[name], [name]                  ACCOUNT NO.:  [id number]   MEDICAL RECORD NO.:  [id number]          PATIENT TYPE:  INP   LOCATION:  1440                         [facility]AC[facility]:  [facility]   PHYSICIAN:  Jamison Neighbor, M.D.  DATE OF BIRTH:  1931-08-31   DATE OF ADMISSION:  09/10/2006  DATE OF DISCHARGE:  09/12/2006                               DISCHARGE SUMMARY   PRINCIPAL PROCEDURE:  Cystoscopy and stent placement on September 12, 2006  by Dr. Annabell Howells.   HISTORY:  This 77 year old male has a history of urolithiasis.  It was  last treated in May 2008 with lithotripsy and stenting.  The patient  then develop a right-sided flank pain, somewhat migratory in nature.  He  developed intermittent hematuria and developed a fever up to 102.5.  The  patient was sent to the emergency room, where a CT scan showed a 6 mm  right midureteral stone.  The patient also has a small aneurysm.  The  patient was seen by Dr. Arbie Cookey from the vascular surgery service, and it  was felt that the aneurysm did not require therapy.  The patient was  admitted to the hospital for antibiotic therapy and stent placement.   Dr. Annabell Howells felt that patient had a symptomatic right midureteral stone  with fever and made arrangements for him to receive stent placement.  He  was taken the operating room, where the stent was placed, and he  defervesced very nicely.  Once the stent was placed, there was  significant improvement in his situation.  The patient was maintained on  antibiotic therapy, and once his white count had normalized and he was  voiding without difficulty, it was felt that he could be sent home.  The  patient was discharged, and the plan will be for him to be scheduled for  ureteroscopy.  He was sent home on Septra DS based on culture, and also  on Lorcet Plus as needed for pain.      Jamison Neighbor, M.D.  Electronically Signed     RJE/MEDQ  D:  11/13/2006  T:  11/14/2006  Job:  [id number]

## 2010-06-06 ENCOUNTER — Ambulatory Visit (HOSPITAL_COMMUNITY)
Admission: RE | Admit: 2010-06-06 | Discharge: 2010-06-06 | Disposition: A | Discharge status: Home / Self Care | Payer: Medicare Other | Source: Ambulatory Visit | Attending: Orthopedic Surgery | Admitting: Orthopedic Surgery

## 2010-06-06 ENCOUNTER — Other Ambulatory Visit (HOSPITAL_COMMUNITY): Payer: Self-pay | Admitting: Orthopedic Surgery

## 2010-06-06 ENCOUNTER — Encounter (HOSPITAL_COMMUNITY)
Admission: RE | Admit: 2010-06-06 | Discharge: 2010-06-06 | Disposition: A | Discharge status: Home / Self Care | Payer: Medicare Other | Source: Ambulatory Visit | Attending: Orthopedic Surgery | Admitting: Orthopedic Surgery

## 2010-06-06 DIAGNOSIS — M19019 Primary osteoarthritis, unspecified shoulder: Secondary | ICD-10-CM | POA: Insufficient documentation

## 2010-06-06 DIAGNOSIS — Z01812 Encounter for preprocedural laboratory examination: Secondary | ICD-10-CM | POA: Insufficient documentation

## 2010-06-06 DIAGNOSIS — R52 Pain, unspecified: Secondary | ICD-10-CM

## 2010-06-06 DIAGNOSIS — Z01810 Encounter for preprocedural cardiovascular examination: Secondary | ICD-10-CM | POA: Insufficient documentation

## 2010-06-06 DIAGNOSIS — Z01818 Encounter for other preprocedural examination: Secondary | ICD-10-CM | POA: Insufficient documentation

## 2010-06-06 LAB — URINALYSIS, ROUTINE W REFLEX MICROSCOPIC
Hgb urine dipstick: NEGATIVE
Specific Gravity, Urine: 1.028 (ref 1.005–1.030)
Urobilinogen, UA: 0.2 mg/dL (ref 0.0–1.0)
pH: 5 (ref 5.0–8.0)

## 2010-06-06 LAB — COMPREHENSIVE METABOLIC PANEL
Albumin: 3.7 g/dL (ref 3.5–5.2)
BUN: 16 mg/dL (ref 6–23)
Calcium: 9.1 mg/dL (ref 8.4–10.5)
Chloride: 104 mEq/L (ref 96–112)
Creatinine, Ser: 1.2 mg/dL (ref 0.4–1.5)
Total Bilirubin: 0.6 mg/dL (ref 0.3–1.2)
Total Protein: 6.1 g/dL (ref 6.0–8.3)

## 2010-06-06 LAB — URINE MICROSCOPIC-ADD ON

## 2010-06-06 LAB — CBC
Platelets: 218 10*3/uL (ref 150–400)
RBC: 4.79 MIL/uL (ref 4.22–5.81)
RDW: 13.9 % (ref 11.5–15.5)
WBC: 6.4 10*3/uL (ref 4.0–10.5)

## 2010-06-06 LAB — APTT: aPTT: 30 seconds (ref 24–37)

## 2010-06-06 LAB — PROTIME-INR
INR: 1.04 (ref 0.00–1.49)
Prothrombin Time: 13.8 seconds (ref 11.6–15.2)

## 2010-06-09 ENCOUNTER — Ambulatory Visit (HOSPITAL_COMMUNITY)
Admission: RE | Admit: 2010-06-09 | Discharge: 2010-06-09 | Disposition: A | Discharge status: Home / Self Care | Payer: Medicare Other | Source: Ambulatory Visit | Attending: Orthopedic Surgery | Admitting: Orthopedic Surgery

## 2010-06-09 DIAGNOSIS — M25819 Other specified joint disorders, unspecified shoulder: Secondary | ICD-10-CM | POA: Insufficient documentation

## 2010-06-09 DIAGNOSIS — M24119 Other articular cartilage disorders, unspecified shoulder: Secondary | ICD-10-CM | POA: Insufficient documentation

## 2010-06-09 DIAGNOSIS — M19019 Primary osteoarthritis, unspecified shoulder: Secondary | ICD-10-CM | POA: Insufficient documentation

## 2010-06-09 DIAGNOSIS — M719 Bursopathy, unspecified: Secondary | ICD-10-CM | POA: Insufficient documentation

## 2010-06-09 DIAGNOSIS — M67919 Unspecified disorder of synovium and tendon, unspecified shoulder: Secondary | ICD-10-CM | POA: Insufficient documentation

## 2010-06-09 DIAGNOSIS — M249 Joint derangement, unspecified: Secondary | ICD-10-CM | POA: Insufficient documentation

## 2010-06-16 ENCOUNTER — Other Ambulatory Visit (INDEPENDENT_AMBULATORY_CARE_PROVIDER_SITE_OTHER): Payer: Medicare Other

## 2010-06-16 DIAGNOSIS — I6529 Occlusion and stenosis of unspecified carotid artery: Secondary | ICD-10-CM

## 2010-06-16 DIAGNOSIS — Z48812 Encounter for surgical aftercare following surgery on the circulatory system: Secondary | ICD-10-CM

## 2010-06-21 ENCOUNTER — Emergency Department (HOSPITAL_COMMUNITY): Payer: Medicare Other

## 2010-06-21 ENCOUNTER — Encounter: Payer: Self-pay | Admitting: Internal Medicine

## 2010-06-21 ENCOUNTER — Ambulatory Visit (INDEPENDENT_AMBULATORY_CARE_PROVIDER_SITE_OTHER): Payer: Medicare Other | Admitting: Internal Medicine

## 2010-06-21 ENCOUNTER — Emergency Department (HOSPITAL_COMMUNITY)
Admission: EM | Admit: 2010-06-21 | Discharge: 2010-06-21 | Disposition: A | Discharge status: Home / Self Care | Payer: Medicare Other | Attending: Emergency Medicine | Admitting: Emergency Medicine

## 2010-06-21 DIAGNOSIS — Z79899 Other long term (current) drug therapy: Secondary | ICD-10-CM | POA: Insufficient documentation

## 2010-06-21 DIAGNOSIS — R112 Nausea with vomiting, unspecified: Secondary | ICD-10-CM

## 2010-06-21 DIAGNOSIS — N390 Urinary tract infection, site not specified: Secondary | ICD-10-CM | POA: Insufficient documentation

## 2010-06-21 DIAGNOSIS — R5383 Other fatigue: Secondary | ICD-10-CM | POA: Insufficient documentation

## 2010-06-21 DIAGNOSIS — R599 Enlarged lymph nodes, unspecified: Secondary | ICD-10-CM | POA: Insufficient documentation

## 2010-06-21 DIAGNOSIS — R079 Chest pain, unspecified: Secondary | ICD-10-CM

## 2010-06-21 DIAGNOSIS — R070 Pain in throat: Secondary | ICD-10-CM | POA: Insufficient documentation

## 2010-06-21 DIAGNOSIS — R1110 Vomiting, unspecified: Secondary | ICD-10-CM | POA: Insufficient documentation

## 2010-06-21 DIAGNOSIS — I2510 Atherosclerotic heart disease of native coronary artery without angina pectoris: Secondary | ICD-10-CM | POA: Insufficient documentation

## 2010-06-21 DIAGNOSIS — R634 Abnormal weight loss: Secondary | ICD-10-CM | POA: Insufficient documentation

## 2010-06-21 DIAGNOSIS — Z85819 Personal history of malignant neoplasm of unspecified site of lip, oral cavity, and pharynx: Secondary | ICD-10-CM | POA: Insufficient documentation

## 2010-06-21 DIAGNOSIS — Z7982 Long term (current) use of aspirin: Secondary | ICD-10-CM | POA: Insufficient documentation

## 2010-06-21 DIAGNOSIS — R5381 Other malaise: Secondary | ICD-10-CM | POA: Insufficient documentation

## 2010-06-21 LAB — COMPREHENSIVE METABOLIC PANEL
ALT: 8 U/L (ref 0–53)
Alkaline Phosphatase: 91 U/L (ref 39–117)
BUN: 17 mg/dL (ref 6–23)
CO2: 28 mEq/L (ref 19–32)
Chloride: 105 mEq/L (ref 96–112)
GFR calc non Af Amer: >60 mL/min (ref >60)
Glucose, Bld: 92 mg/dL (ref 70–99)
Potassium: 4.3 mEq/L (ref 3.5–5.1)
Sodium: 140 mEq/L (ref 135–145)
Total Bilirubin: 0.9 mg/dL (ref 0.3–1.2)

## 2010-06-21 LAB — URINALYSIS, ROUTINE W REFLEX MICROSCOPIC
Glucose, UA: NEGATIVE mg/dL
Hgb urine dipstick: NEGATIVE
Ketones, ur: 15 mg/dL — AB
Nitrite: NEGATIVE
Protein, ur: NEGATIVE mg/dL
Specific Gravity, Urine: 1.024 (ref 1.005–1.030)
Urobilinogen, UA: 1 mg/dL (ref 0.0–1.0)
pH: 6 (ref 5.0–8.0)

## 2010-06-21 LAB — URINE MICROSCOPIC-ADD ON

## 2010-06-21 LAB — COMPREHENSIVE METABOLIC PANEL WITH GFR
AST: 12 U/L (ref 0–37)
Albumin: 3.4 g/dL — ABNORMAL LOW (ref 3.5–5.2)
Calcium: 8.6 mg/dL (ref 8.4–10.5)
Creatinine, Ser: 0.91 mg/dL (ref 0.4–1.5)
GFR calc Af Amer: >60 mL/min (ref >60)
Total Protein: 6.9 g/dL (ref 6.0–8.3)

## 2010-06-21 LAB — DIFFERENTIAL
Basophils Absolute: 0.1 K/uL (ref 0.0–0.1)
Basophils Relative: 1 % (ref 0–1)
Eosinophils Absolute: 0.2 K/uL (ref 0.0–0.7)
Eosinophils Relative: 2 % (ref 0–5)
Lymphocytes Relative: 11 % — ABNORMAL LOW (ref 12–46)
Lymphs Abs: 1.2 10*3/uL (ref 0.7–4.0)
Monocytes Absolute: 0.9 K/uL (ref 0.1–1.0)
Monocytes Relative: 8 % (ref 3–12)
Neutro Abs: 8.6 10*3/uL — ABNORMAL HIGH (ref 1.7–7.7)
Neutrophils Relative %: 79 % — ABNORMAL HIGH (ref 43–77)

## 2010-06-21 LAB — CBC
HCT: 40.7 % (ref 39.0–52.0)
Hemoglobin: 13.4 g/dL (ref 13.0–17.0)
MCH: 30.9 pg (ref 26.0–34.0)
MCHC: 32.9 g/dL (ref 30.0–36.0)
MCV: 93.8 fL (ref 78.0–100.0)
Platelets: 284 10*3/uL (ref 150–400)
RBC: 4.34 MIL/uL (ref 4.22–5.81)
RDW: 13.3 % (ref 11.5–15.5)
WBC: 10.9 10*3/uL — ABNORMAL HIGH (ref 4.0–10.5)

## 2010-06-21 LAB — CK TOTAL AND CKMB (NOT AT ARMC)
CK, MB: 1.8 ng/mL (ref 0.3–4.0)
Relative Index: INVALID (ref 0.0–2.5)
Total CK: 55 U/L (ref 7–232)

## 2010-06-21 LAB — TROPONIN I: Troponin I: <0.3 ng/mL (ref <0.30)

## 2010-06-21 NOTE — Assessment & Plan Note (Signed)
EKG obtained and reviewed. NSR 67 with nl intervals and axis. No evidence of acute ischemic change. Given risk factors/underlying multivessel dz and sx's cannot exclude [place]. Recommend ED evaluation and pt agreeable. ED charge nurse contacted and informed of pt's impending arrival.

## 2010-06-21 NOTE — Assessment & Plan Note (Signed)
Exam benign. May represent gastroenteritis however with complaints of CP recommend ED evaluation.

## 2010-06-21 NOTE — Progress Notes (Signed)
  Subjective:    Patient ID: [name], male    DOB: 08-18-1931, 77 y.o.   MRN: [id number]  HPI Pt presents to clinic for evaluation of malaise and dizziness. Notes underwent right arthroscopic shoulder surgery approximately 6days ago. Since that time developed malaise and dizziness without presyncope or syncope. Admits to having intermittent sternal CP over the same period without radiation, diaphoresis or dysnpea. Occurs at rest and may last several minutes followed by spontaneous resolution. Also notes associated palpitations described as skipped beats occuring since surgery. CP is not positional nor reproducible. Developed nausea and emesis today without abdominal pain, fever, chills, or change in bowel habits. Currently denies CP. No alleviating or exacerbating factors. No other complaints.  Chart review indicates h/o multivessel CAD s/p CABG with last cardiac catheterization performed ~10/10 with patent grafts. Reviewed pmh, medications and allergies   Review of Systems  Constitutional: Positive for fatigue. Negative for fever, chills and diaphoresis.  HENT: Positive for hearing loss. Negative for congestion.   Respiratory: Negative for cough, shortness of breath and wheezing.   Cardiovascular: Positive for chest pain and palpitations.  Gastrointestinal: Positive for nausea and vomiting. Negative for abdominal pain, diarrhea, constipation, blood in stool and abdominal distention.  Skin: Negative for rash and wound.  Neurological: Positive for dizziness. Negative for syncope and weakness.       Objective:   Physical Exam  [nursing notereviewed. Constitutional: He appears well-developed and well-nourished. No distress.  HENT:  Head: Normocephalic and atraumatic.  Right Ear: External ear normal.  Left Ear: External ear normal.  Eyes: Conjunctivae are normal. No scleral icterus.  Neck: Neck supple.       Left lateral cervical ?LN  Cardiovascular: Normal rate and regular rhythm.   Exam reveals friction rub. Exam reveals no gallop.   No murmur heard. Pulmonary/Chest: Effort normal and breath sounds normal. No respiratory distress. He has no wheezes. He has no rales.  Abdominal: Soft. Bowel sounds are normal. He exhibits no distension. There is no tenderness. There is no guarding.  Lymphadenopathy:    He has cervical adenopathy.  Skin: Skin is warm and dry. He is not diaphoretic.          Assessment & Plan:

## 2010-06-22 LAB — URINE CULTURE
Colony Count: NO GROWTH
Culture  Setup Time: 201206052214
Culture: NO GROWTH

## 2010-06-22 NOTE — Procedures (Unsigned)
CAROTID DUPLEX EXAM  INDICATION:  Status post left CEA.  HISTORY: Diabetes:  No. Cardiac:  Yes. Hypertension:  Yes. Smoking:  Previous. Previous Surgery:  Left CEA, 04/20/2009. CV History: Amaurosis Fugax No, Paresthesias No, Hemiparesis No.                                      RIGHT             LEFT Brachial systolic pressure:         144               136 Brachial Doppler waveforms:         WNL               WNL Vertebral direction of flow:        Antegrade         Antegrade DUPLEX VELOCITIES (cm/sec) CCA peak systolic                   65                79 ECA peak systolic                   66                75 ICA peak systolic                   54                59 ICA end diastolic                   10                19 PLAQUE MORPHOLOGY: PLAQUE AMOUNT:                      Minimal           None PLAQUE LOCATION:  IMPRESSION: 1. Widely patent left carotid endarterectomy without evidence of     restenosis or hyperplasia. 2. Minimal plaquing at the right internal carotid artery. 3. Bilateral vertebral arteries are within normal limits.  ___________________________________________ Larina Earthly, M.D.  LT/MEDQ  D:  06/16/2010  T:  06/16/2010  Job:  [id number]

## 2010-06-24 ENCOUNTER — Encounter: Payer: Self-pay | Admitting: Internal Medicine

## 2010-06-24 ENCOUNTER — Ambulatory Visit (INDEPENDENT_AMBULATORY_CARE_PROVIDER_SITE_OTHER): Payer: Medicare Other | Admitting: Internal Medicine

## 2010-06-24 VITALS — BP 130/80 | HR 72 | Temp 98.8°F | Resp 16 | Ht 72.0 in | Wt 176.0 lb

## 2010-06-24 DIAGNOSIS — J329 Chronic sinusitis, unspecified: Secondary | ICD-10-CM

## 2010-06-24 DIAGNOSIS — N419 Inflammatory disease of prostate, unspecified: Secondary | ICD-10-CM

## 2010-06-24 DIAGNOSIS — R6251 Failure to thrive (child): Secondary | ICD-10-CM

## 2010-06-24 MED ORDER — LEVOFLOXACIN 500 MG PO TABS: 500.0000 mg | ORAL_TABLET | Freq: Every day | ORAL | Status: AC

## 2010-06-24 MED ORDER — CITALOPRAM HYDROBROMIDE 40 MG PO TABS: 40.0000 mg | ORAL_TABLET | Freq: Every day | ORAL | Status: DC

## 2010-06-24 NOTE — Progress Notes (Signed)
  Subjective:    Patient ID: [name], male    DOB: [birth year], 77 y.o.   MRN: [id number]  HPI The pt was seen in the ER with a positive UTI ( prostatitis) The pt has decreased appetite The pt has been feeling dizzy and week. He had shoulder surgery  The CXR was normal, the wbc was elevated ( normal Hgb) He was placed on cipro    Review of Systems  Constitutional: Negative for fever and fatigue.  HENT: Negative for hearing loss, congestion, neck pain and postnasal drip.   Eyes: Negative for discharge, redness and visual disturbance.  Respiratory: Negative for cough, shortness of breath and wheezing.   Cardiovascular: Negative for leg swelling.  Gastrointestinal: Negative for abdominal pain, constipation and abdominal distention.  Genitourinary: Negative for urgency and frequency.  Musculoskeletal: Negative for joint swelling and arthralgias.  Skin: Negative for color change and rash.  Neurological: Negative for weakness and light-headedness.  Hematological: Negative for adenopathy.  Psychiatric/Behavioral: Negative for behavioral problems.   Past Medical History  Diagnosis Date  . Carotid bruit   . Abdominal aortic aneurysm     [place] 11/09 -3.8x3.8cm  . Radicular low back pain   . BPH (benign prostatic hypertrophy)   . Aphthous ulcer   . Unspecified adverse effect of unspecified drug, medicinal and biological substance   . GERD (gastroesophageal reflux disease)   . History of nephrolithiasis   . Hyperlipidemia   . CAD (coronary artery disease)   . Pre-syncope   . 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 Coronary artery bypass graft   . Coronary artery bypass graft 1998  . Cholecystectomy   . Prostate surgery   . Back surgery   . Renal stone surgery     3 times  . Throat surgery   . Cardiac catheterization     reports that he quit smoking about 4 years ago. He does not have any smokeless tobacco history on  file. He reports that he does not drink alcohol or use illicit drugs. family history includes Coronary artery disease in an unspecified family member; Heart disease in his father; and Stroke in his father. Allergies  Allergen Reactions  . Vibramycin Other (See Comments)    Causes patient chronic colitis; was hospitalized two weeks due to med.  . Codeine Sulfate Nausea And Vomiting       Objective:   Physical Exam  Constitutional: He appears well-developed and well-nourished. He appears distressed.  HENT:  Head: Normocephalic and atraumatic.  Eyes: Conjunctivae are normal. Pupils are equal, round, and reactive to light.  Neck: Normal range of motion. Neck supple.  Cardiovascular: Normal rate and regular rhythm.   Pulmonary/Chest: Effort normal. He exhibits tenderness.  Abdominal: Soft. Bowel sounds are normal.   The turbinates are red and swollen with marked post nasal drip and cobblestoning        Assessment & Plan:  Patient has prostatitis was placed on Cipro for possible urinary tract infection after being seen in the emergency room earlier this week was placed on only 7 days which would be insufficient to treat prostatitis His chest x-ray was clear but he is coughing indicating that he may have increased sinus difficulty. The choice of Levaquin would treat both sinusitis and prostatitis.

## 2010-06-29 NOTE — Op Note (Addendum)
NAMEHARLES, EVETTS                  ACCOUNT NO.:  [id number]  MEDICAL RECORD NO.:  [id number]  LOCATION:  XRAY                         FACILITY:  [facility]  PHYSICIAN:  Vania Rea. Judah Chevere, M.D.  DATE OF BIRTH:  [birth year]  DATE OF PROCEDURE:  06/09/2010 DATE OF DISCHARGE:  06/06/2010                              OPERATIVE REPORT   PREOPERATIVE DIAGNOSES: 1. Chronic right shoulder impingement syndrome. 2. Right shoulder acromioclavicular joint arthropathy.  POSTOPERATIVE DIAGNOSES: 1. Chronic right shoulder impingement syndrome. 2. Right shoulder acromioclavicular joint arthropathy. 3. Right shoulder labral tear. 4. Right shoulder biceps tendon tear. 5. Right shoulder irreparable rotator cuff tear.  PROCEDURE: 1. Right shoulder examination under anesthesia. 2. Right shoulder glenohumeral joint and diagnostic arthroscopy. 3. Debridement of complex. 4. Extensive degenerative labral tear. 5. Biceps tendon tenotomy. 6. Arthroscopic subacromial decompression and bursectomy. 7. Arthroscopic distal clavicle resection. 8. Debridement of irreparable rotator cuff tear.  SURGEON:  Vania Rea. Haley Roza, MD  ASSISTANT:  Lucita Lora. Shuford, PA-C.  ANESTHESIA:  General endotracheal.  Interscalene block was attempted, but due to altered anatomy it was not able to be placed.  We did supplement with local anesthetic and extended release Marcaine preparation and instilled into the portals and subacromial bursa at the end of the case.  HISTORY:  Mr. Wilmer is a 77 year old gentleman who has had chronic of progressive increasing right shoulder pain and function limitations with examination showing pain, weakness with testing rotator cuff and plain radiographs showed the advanced AC joint arthropathy.  Due to his ongoing pain and functional limitations, he is brought to the operating room at this time for planned right shoulder arthroscopy as described below.  Preoperatively, counseled Mr. Trevathan on  treatment options as well as risks versus benefits thereof.  Possible surgical complications were reviewed including potential for bleeding, infection, neurovascular injury, persistent pain, loss of motion, anesthetic complication and possible need for additional surgery.  He understands and accepts and agrees to planned procedure.  PROCEDURE IN DETAIL:  After undergoing routine preop evaluation, the patient received prophylactic antibiotics and an attempt was made interscalene block but unsuccessful due to altered neck anatomy and multiple varicose veins due to his previous neck dissection.  Attempts were abandoned by the Anesthesia Service.  Subsequently, he was brought to the operative room, placed supine on the operative table, underwent smooth induction of general endotracheal anesthesia.  Turned to the left lateral decubitus position on beanbag and appropriately padded and protected.  Right arm examination under anesthesia revealed shoulder motion at approximately 150 degrees forward elevation with firm endpoint, but no release of adhesions were noted with attempted manipulation.  Right arm suspended at 70 degrees of abduction with 10 pounds traction.  The right shoulder region sterilely prepped and draped in standard fashion.  Time-out was called.  Posterior portal was established in the glenohumeral joint, anterior portal was established under direct visualization.  The glenohumeral articular surfaces were in good condition.  There was degenerative tearing in the superior half of the labrum.  This was all debrided with a shaver to a stable margin. The biceps tendon showed some medial subluxation with fraying distally at the end of the bicipital  groove and on further inspection it appeared as though there was a tear involving the rotator interval and the very anterior margin of the rotator cuff.  Given the instability of the biceps tendon and its significant fraying, we proceeded  with a biceps tenotomy.  We then debrided the residual stump of the superior labrum and biceps stump.  A tag suture was passed for further identification of rotator cuff tear.  No additional intra-articular pathologies were identified.  Fluid and instruments were then removed.  The arm was then dropped down to 30 degrees of abduction with arthroscope introduced into the subacromial space and posterior portal and then direct lateral portal established in the subacromial space.  Abundant proliferative bursal tissue, multiple adhesions were encountered and these were divided and excised with combination of the shaver and the Stryker wand. The wand was then used to remove the periosteum from the undersurface of the anterior half of the acromion and then a subacromial depression was performed with a bur creating a type 1 morphology.  Of note, there was a very prominent inferiorly projecting osteophyte from the anterior acromion as well as of the [facility] joint.  These were nicely decompressed. On further inspection, however, we did see that the osteophytes of the [facility] joint had caused a direct disruption of the underlying rotator cuff creating a defect that we had noted earlier.  There was a geographic absence of rotator cuff tissue over the rotator interval.  This tear projected somewhat posteriorly into the supraspinatus, although the majority of the supra and infraspinatus were intact and this appeared to be of more defect involving the rotator interval region.  We went ahead and established the portal directly anterior to the distal clavicle and the distal clavicle resection was performed with a bur.  Care was taken to make sure the entire circumference of the distal clavicle could be visualized to ensure adequate removal of the bone.  We then completed subacromial/subdeltoid bursectomy.  Attempts were made for mobilization of the rotator cuff anterosuperior, but the margins were quite  scarified and this did appear to be essentially over the rotator interval with the subscapularis and supraspinatus be intact and with these findings, we elected to simply debride the rotator cuff margins.  At this point, bursectomy was then completed.  Hemostasis was obtained.  Fluid and instruments were removed.  Portals were closed with Monocryl and Steri- Strips.  A 20-mL vial of liposomal bupivacaine was then injected into the periportal soft tissues and into the subacromial bursa.  Dry dressing was then wrapped about the right shoulder and right arm was placed in sling.  The patient was then awakened, extubated and taken to the recovery room in stable condition.     Vania Rea. Hula Tasso, M.D.    KMS/MEDQ  D:  06/09/2010  T:  06/10/2010  Job:  [id number]  Electronically Signed by Francena Hanly M.D. on 07/23/2010 10:33:26 AM

## 2010-08-08 ENCOUNTER — Ambulatory Visit (INDEPENDENT_AMBULATORY_CARE_PROVIDER_SITE_OTHER): Payer: Medicare Other | Admitting: Internal Medicine

## 2010-08-08 ENCOUNTER — Encounter: Payer: Self-pay | Admitting: Internal Medicine

## 2010-08-08 DIAGNOSIS — I2510 Atherosclerotic heart disease of native coronary artery without angina pectoris: Secondary | ICD-10-CM

## 2010-08-08 DIAGNOSIS — IMO0002 Reserved for concepts with insufficient information to code with codable children: Secondary | ICD-10-CM

## 2010-08-08 DIAGNOSIS — M159 Polyosteoarthritis, unspecified: Secondary | ICD-10-CM

## 2010-08-08 DIAGNOSIS — E785 Hyperlipidemia, unspecified: Secondary | ICD-10-CM

## 2010-08-08 DIAGNOSIS — K219 Gastro-esophageal reflux disease without esophagitis: Secondary | ICD-10-CM

## 2010-08-08 DIAGNOSIS — I10 Essential (primary) hypertension: Secondary | ICD-10-CM

## 2010-08-08 LAB — BASIC METABOLIC PANEL
BUN: 14 mg/dL (ref 6–23)
CO2: 29 mEq/L (ref 19–32)
Calcium: 8.4 mg/dL (ref 8.4–10.5)
Chloride: 103 mEq/L (ref 96–112)
Creatinine, Ser: 1.1 mg/dL (ref 0.4–1.5)

## 2010-08-08 LAB — LIPID PANEL
Cholesterol: 139 mg/dL (ref 0–200)
LDL Cholesterol: 71 mg/dL (ref 0–99)
Total CHOL/HDL Ratio: 3
Triglycerides: 69 mg/dL (ref 0.0–149.0)

## 2010-08-08 LAB — HEPATIC FUNCTION PANEL
AST: 18 U/L (ref 0–37)
Alkaline Phosphatase: 82 U/L (ref 39–117)
Bilirubin, Direct: 0.2 mg/dL (ref 0.0–0.3)
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
## 2010-09-26 ENCOUNTER — Ambulatory Visit: Payer: Medicare Other | Admitting: Physician Assistant

## 2010-09-26 ENCOUNTER — Ambulatory Visit (INDEPENDENT_AMBULATORY_CARE_PROVIDER_SITE_OTHER): Payer: Medicare Other | Admitting: Physician Assistant

## 2010-09-26 ENCOUNTER — Encounter: Payer: Self-pay | Admitting: Physician Assistant

## 2010-09-26 VITALS — BP 107/67 | HR 73 | Resp 16 | Ht 71.0 in | Wt 176.0 lb

## 2010-09-26 DIAGNOSIS — I2510 Atherosclerotic heart disease of native coronary artery without angina pectoris: Secondary | ICD-10-CM

## 2010-09-26 DIAGNOSIS — I10 Essential (primary) hypertension: Secondary | ICD-10-CM

## 2010-09-26 DIAGNOSIS — E785 Hyperlipidemia, unspecified: Secondary | ICD-10-CM

## 2010-09-26 DIAGNOSIS — I739 Peripheral vascular disease, unspecified: Secondary | ICD-10-CM

## 2010-09-26 NOTE — Assessment & Plan Note (Signed)
Controlled.  

## 2010-09-26 NOTE — Assessment & Plan Note (Signed)
Followed by PCP.  LDL optimal 2 months ago.

## 2010-09-26 NOTE — Assessment & Plan Note (Signed)
Stable.  No angina.  Continue aspirin and statin.  Followup with Dr. Gala Romney in one year.

## 2010-09-26 NOTE — Assessment & Plan Note (Signed)
Followed by [facility] 

## 2010-09-26 NOTE — Patient Instructions (Signed)
Your physician wants you to follow-up in: 1 YEAR WITH DR. Gala Romney. You will receive a reminder letter in the mail two months in advance. If you don't receive a letter, please call our office to schedule the follow-up appointment.   NO CHANGES @ THIS TIME
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
## 2010-10-28 LAB — BASIC METABOLIC PANEL
BUN: 15
CO2: 25
Chloride: 108
Creatinine, Ser: 1.3
GFR calc Af Amer: >60
Glucose, Bld: 108 — ABNORMAL HIGH

## 2010-10-28 LAB — URINE CULTURE

## 2010-10-28 LAB — CBC
HCT: 35 — ABNORMAL LOW
Hemoglobin: 11.7 — ABNORMAL LOW
MCHC: 34.4
MCV: 89.3
RBC: 3.93 — ABNORMAL LOW
RDW: 13.3
RDW: 14.6 — ABNORMAL HIGH
WBC: 8.5

## 2010-10-28 LAB — DIFFERENTIAL
Basophils Absolute: 0.2 — ABNORMAL HIGH
Basophils Relative: 2 — ABNORMAL HIGH
Eosinophils Absolute: 0
Monocytes Relative: 11
Neutrophils Relative %: 76

## 2010-10-28 LAB — URINALYSIS, ROUTINE W REFLEX MICROSCOPIC
Bilirubin Urine: NEGATIVE
Glucose, UA: NEGATIVE
Ketones, ur: NEGATIVE
Specific Gravity, Urine: 1.013
pH: 7

## 2010-10-28 LAB — URINE MICROSCOPIC-ADD ON

## 2010-11-15 ENCOUNTER — Ambulatory Visit (INDEPENDENT_AMBULATORY_CARE_PROVIDER_SITE_OTHER): Payer: Medicare Other | Admitting: Internal Medicine

## 2010-11-15 ENCOUNTER — Encounter: Payer: Self-pay | Admitting: Internal Medicine

## 2010-11-15 VITALS — BP 110/70 | HR 72 | Temp 98.3°F | Resp 16 | Ht 71.0 in | Wt 176.0 lb

## 2010-11-15 DIAGNOSIS — E785 Hyperlipidemia, unspecified: Secondary | ICD-10-CM

## 2010-11-15 DIAGNOSIS — I10 Essential (primary) hypertension: Secondary | ICD-10-CM

## 2010-11-15 DIAGNOSIS — M542 Cervicalgia: Secondary | ICD-10-CM

## 2010-11-15 DIAGNOSIS — I2510 Atherosclerotic heart disease of native coronary artery without angina pectoris: Secondary | ICD-10-CM

## 2010-11-15 NOTE — Patient Instructions (Signed)
Accupressure trial for pain
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
## 2010-12-02 ENCOUNTER — Other Ambulatory Visit: Payer: Self-pay | Admitting: Internal Medicine

## 2010-12-13 ENCOUNTER — Encounter (INDEPENDENT_AMBULATORY_CARE_PROVIDER_SITE_OTHER): Payer: Medicare Other | Admitting: *Deleted

## 2010-12-13 DIAGNOSIS — I714 Abdominal aortic aneurysm, without rupture, unspecified: Secondary | ICD-10-CM

## 2010-12-21 ENCOUNTER — Encounter: Payer: Self-pay | Admitting: Vascular Surgery

## 2010-12-21 NOTE — Procedures (Unsigned)
DUPLEX ULTRASOUND OF ABDOMINAL AORTA  INDICATION:  AAA.  HISTORY: Diabetes:  No. Cardiac:  Yes. Hypertension:  Yes. Smoking:  Previous. Connective Tissue Disorder: Family History: Previous Surgery:  No.  DUPLEX EXAM:         AP (cm)                   TRANSVERSE (cm) Proximal             Not visualized            Not visualized Mid                  3.37 cm                   Not visualized Distal               Not visualized            3.68 cm Right Iliac          1.10 cm                   1.24 cm Left Iliac           1.10 cm                   1.12 cm  PREVIOUS:  Date: 12/14/2009  AP:  3.8  TRANSVERSE:  3.5  IMPRESSION: 1. Abdominal aortic aneurysm noted today with largest measurement of     3.68 cm. 2. Limited visualization due to overlying bowel gas.  ___________________________________________ Larina Earthly, M.D.  EM/MEDQ  D:  12/14/2010  T:  12/14/2010  Job:  [id number]

## 2011-01-09 ENCOUNTER — Ambulatory Visit: Payer: Medicare Other | Admitting: Internal Medicine

## 2011-02-01 DIAGNOSIS — M47812 Spondylosis without myelopathy or radiculopathy, cervical region: Secondary | ICD-10-CM | POA: Diagnosis not present

## 2011-02-02 DIAGNOSIS — M4802 Spinal stenosis, cervical region: Secondary | ICD-10-CM | POA: Diagnosis not present

## 2011-02-02 DIAGNOSIS — M5412 Radiculopathy, cervical region: Secondary | ICD-10-CM | POA: Diagnosis not present

## 2011-02-14 ENCOUNTER — Ambulatory Visit (INDEPENDENT_AMBULATORY_CARE_PROVIDER_SITE_OTHER): Payer: Medicare Other | Admitting: Internal Medicine

## 2011-02-14 ENCOUNTER — Encounter: Payer: Self-pay | Admitting: Internal Medicine

## 2011-02-14 VITALS — BP 120/72 | HR 72 | Temp 98.2°F | Resp 16 | Ht 71.0 in | Wt 181.0 lb

## 2011-02-14 DIAGNOSIS — E785 Hyperlipidemia, unspecified: Secondary | ICD-10-CM | POA: Diagnosis not present

## 2011-02-14 DIAGNOSIS — M5090 Cervical disc disorder, unspecified, unspecified cervical region: Secondary | ICD-10-CM

## 2011-02-14 DIAGNOSIS — M5412 Radiculopathy, cervical region: Secondary | ICD-10-CM | POA: Diagnosis not present

## 2011-02-14 MED ORDER — MORPHINE SULFATE ER BEADS 90 MG PO CP24: 90.0000 mg | ORAL_CAPSULE | Freq: Every day | ORAL | Status: DC

## 2011-02-14 MED ORDER — MORPHINE SULFATE 15 MG PO TABS: 15.0000 mg | ORAL_TABLET | ORAL | Status: AC | PRN

## 2011-02-14 MED ORDER — GABAPENTIN 100 MG PO CAPS: 100.0000 mg | ORAL_CAPSULE | Freq: Every day | ORAL | Status: DC

## 2011-02-14 NOTE — Progress Notes (Signed)
Subjective:    Patient ID: [name], male    DOB: 11-Nov-1931, 78 y.o.   MRN: [id number]  HPI  78 year old white male who has been seen for chronic neck pain and disc disease he is status post surgery with Dr. Alen Bleacher.  He has been referred to physical medicine for epidural injections he recently had an epidural injection which failed to eliminate pain.  He still has significant pain that he rates as 8/10. The pain is significantly affecting his daily functioning. He is currently taking hydrocodone and Mobic for pain control.  He is on citalopram for depression associated with his chronic pain comorbid findings also include hyperlipidemia on Crestor   Review of Systems  Constitutional: Negative for fever and fatigue.  HENT: Negative for hearing loss, congestion, neck pain and postnasal drip.   Eyes: Negative for discharge, redness and visual disturbance.  Respiratory: Negative for cough, shortness of breath and wheezing.   Cardiovascular: Negative for leg swelling.  Gastrointestinal: Negative for abdominal pain, constipation and abdominal distention.  Genitourinary: Negative for urgency and frequency.  Musculoskeletal: Negative for joint swelling and arthralgias.  Skin: Negative for color change and rash.  Neurological: Negative for weakness and light-headedness.  Hematological: Negative for adenopathy.  Psychiatric/Behavioral: Negative for behavioral problems.   Past Medical History  Diagnosis Date  . Carotid stenosis     s/p Left CEA, followed by Dr. Arbie Cookey  . Abdominal aortic aneurysm     [place] 11/09 -3.8x3.8cm, 3.8 cm in 2011; followed by Dr. Arbie Cookey  . Radicular low back pain   . BPH (benign prostatic hypertrophy)   . Aphthous ulcer   . Unspecified adverse effect of unspecified drug, medicinal and biological substance   . GERD (gastroesophageal reflux disease)   . History of nephrolithiasis   . Hyperlipidemia   . CAD (coronary artery disease)     s/p CABG 1993;  cath  10/10: severe native 3v CAD, S-Dx ok, S-OM1/OM2 ok, S-PDA ok, L-LAD ok, EF 60%  . Pre-syncope   . 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 Marital Status: Married    Spouse Name: N/A    Number of Children: N/A  . Years of Education: N/A   Occupational History  . retired    Social History Main Topics  . Smoking status: Former Smoker    Quit date: 01/16/2006  . Smokeless tobacco: Not on file  . Alcohol Use: No  . Drug Use: No  . Sexually Active: Not Currently   Other Topics Concern  . Not on file   Social History Narrative  . No narrative on file    Past Surgical History  Procedure Date  . Coronary artery bypass graft   . Coronary artery bypass graft 1998  . Cholecystectomy   . Prostate surgery   . Back surgery   . Renal stone surgery     3 times  . Throat surgery   . Cardiac catheterization     Family History  Problem Relation Age of Onset  . Coronary artery disease    . Heart disease Father   . Stroke Father     Allergies  Allergen Reactions  . Vibramycin Other (See Comments)    Causes patient chronic colitis; was hospitalized two weeks due to med.  . Codeine Sulfate Nausea And Vomiting    Current Outpatient Prescriptions on File Prior to Visit  Medication Sig Dispense Refill  . aspirin  81 MG tablet Take 81 mg by mouth daily.        . citalopram (CELEXA) 40 MG tablet Take 1 tablet (40 mg total) by mouth daily.  90 tablet  3  . HYDROcodone-acetaminophen (VICODIN) 5-500 MG per tablet       . meloxicam (MOBIC) 15 MG tablet Take 15 mg by mouth daily as needed.        . rosuvastatin (CRESTOR) 20 MG tablet Take 20 mg by mouth. 1/2 every other day        . Tamsulosin HCl (FLOMAX) 0.4 MG CAPS TAKE ONE CAPSULE BY MOUTH AT BEDTIME  30 capsule  11  . vitamin E (VITAMIN E) 400 UNIT capsule Take 400 Units by mouth daily.          BP 120/72  Pulse 72  Temp 98.2 F (36.8 C)  Resp 16  Ht 5\' 11"  (1.803 m)  Wt 181 lb  (82.101 kg)  BMI 25.24 kg/m2       Objective:   Physical Exam  Nursing note and vitals reviewed. Constitutional: He is oriented to person, place, and time. He appears well-developed and well-nourished.  HENT:  Head: Normocephalic and atraumatic.  Eyes: Conjunctivae are normal. Pupils are equal, round, and reactive to light.  Neck: Normal range of motion. Neck supple.  Cardiovascular: Normal rate and regular rhythm.   Pulmonary/Chest: Effort normal and breath sounds normal.  Abdominal: Soft. Bowel sounds are normal.  Musculoskeletal: Normal range of motion.  Neurological: He is alert and oriented to person, place, and time.  Skin: Skin is warm and dry.          Assessment & Plan:  The patient has failed Percocet in the past he tolerates hydrocodone without any mental status changes he's never been on a morphine base drug were going to investigate whether not a long-acting morphine base drug may be the best medication with immediate release morphine being his when necessary medication for pain control until we get a better idea of how we can physically control the pain to either nerve block or surgical considerations.  We will also consider a second opinion neurosurgical evaluation and neurology to help [place] understand his pain.

## 2011-02-14 NOTE — Patient Instructions (Addendum)
The patient is instructed to continue all medications as prescribed. Schedule followup with check out clerk upon leaving the clinic We're stopping the hydrocodone and replacing it with a long-acting 90 mg tablet once a day and 15 mg tablets that she can take for breakthrough pain every 4 hours if you need it we are also starting you on a medicine that helps with pain at bedtime called gabapentin at a very low dose so that you can learn to tolerate.

## 2011-02-15 ENCOUNTER — Telehealth: Payer: Self-pay | Admitting: *Deleted

## 2011-02-15 NOTE — Telephone Encounter (Signed)
Advised the wife to take the pt to the ER pr Dr. Lovell Sheehan.

## 2011-02-15 NOTE — Telephone Encounter (Addendum)
Pt seems to be doing better, is drinking and sitting up, knows the family, and wife is waiting for a response from Dr. Sanjuana Mae re: pain management and what the plan should be.  Dr. Lovell Sheehan notified and is in agreement.  Family did not want to take pt to the hospital this am.

## 2011-02-15 NOTE — Telephone Encounter (Signed)
Wife states Pt is still incoherent, and cannot walk, slept all night without moving.  Only urinated once with her insisting, and it was brown.  She cannot handle him.  Complains of no pain, and had to call her daughter home to help.  Will speak if spoken to, but otherwise does not talk.

## 2011-03-07 DIAGNOSIS — M47812 Spondylosis without myelopathy or radiculopathy, cervical region: Secondary | ICD-10-CM | POA: Diagnosis not present

## 2011-03-14 ENCOUNTER — Ambulatory Visit (INDEPENDENT_AMBULATORY_CARE_PROVIDER_SITE_OTHER): Payer: Medicare Other | Admitting: Internal Medicine

## 2011-03-14 ENCOUNTER — Encounter: Payer: Self-pay | Admitting: Internal Medicine

## 2011-03-14 VITALS — BP 140/80 | HR 76 | Temp 99.2°F | Resp 16 | Ht 71.0 in | Wt 178.0 lb

## 2011-03-14 DIAGNOSIS — J3489 Other specified disorders of nose and nasal sinuses: Secondary | ICD-10-CM

## 2011-03-14 DIAGNOSIS — M5412 Radiculopathy, cervical region: Secondary | ICD-10-CM | POA: Diagnosis not present

## 2011-03-14 DIAGNOSIS — M5090 Cervical disc disorder, unspecified, unspecified cervical region: Secondary | ICD-10-CM

## 2011-03-14 MED ORDER — GABAPENTIN 100 MG PO CAPS: 100.0000 mg | ORAL_CAPSULE | Freq: Three times a day (TID) | ORAL | Status: DC

## 2011-03-14 NOTE — Patient Instructions (Addendum)
The Neurontin is in addition to hydrocodone and please take the Neurontin 100 mg at night for several days then try to take it twice a day then try to increase it to 3 times a day the new prescription at the pharmacy is for 90 tablets  I have listed morphine as an allergy on your more medicine list  You are cleared for anesthesia for the paraspinal injections

## 2011-03-14 NOTE — Progress Notes (Signed)
Subjective:    Patient ID: [name], male    DOB: Jul 09, 1931, 78 y.o.   MRN: [id number]  HPI  Patient is a 78 year old white male who presents for chronic cervical pain. Recently he was given a trial of Avinza or time release morphine and he had a severe reaction in which he developed urinary retention and extreme dysphoria.  The medicine took several hours to clear out of his system with hydration he was eventually able to urinate without having to have an in out catheter or an emergency room visit.  We have added morphine to his allergy list is as intolerance.  He continues to use hydrocodone successfully but it is not fully alleviate his pain he is begun 8 Neurontin or gabapentin titration starting with 100 mg at bedtime and gradually moving up to 103 times a day he has an appointment with the pain specialist to do.  Spinal injections  In hid neck He will require twilight anesthesia for this procedure   Review of Systems  Constitutional: Negative for fever and fatigue.  HENT: Positive for nosebleeds, congestion and rhinorrhea. Negative for hearing loss, neck pain and postnasal drip.   Eyes: Negative for discharge, redness and visual disturbance.  Respiratory: Negative for cough, shortness of breath and wheezing.   Cardiovascular: Negative for leg swelling.  Gastrointestinal: Negative for abdominal pain, constipation and abdominal distention.  Genitourinary: Negative for urgency and frequency.  Musculoskeletal: Positive for myalgias, back pain, joint swelling and arthralgias.  Skin: Negative for color change and rash.  Neurological: Negative for weakness and light-headedness.  Hematological: Negative for adenopathy.  Psychiatric/Behavioral: Negative for behavioral problems.      Past Medical History  Diagnosis Date  . Carotid stenosis     s/p Left CEA, followed by Dr. Arbie Cookey  . Abdominal aortic aneurysm     [place] 11/09 -3.8x3.8cm, 3.8 cm in 2011; followed by Dr. Arbie Cookey  .  Radicular low back pain   . BPH (benign prostatic hypertrophy)   . Aphthous ulcer   . Unspecified adverse effect of unspecified drug, medicinal and biological substance   . GERD (gastroesophageal reflux disease)   . History of nephrolithiasis   . Hyperlipidemia   . CAD (coronary artery disease)     s/p CABG 1993;  cath 10/10: severe native 3v CAD, S-Dx ok, S-OM1/OM2 ok, S-PDA ok, L-LAD ok, EF 60%  . Pre-syncope   . 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 Marital Status: Married    Spouse Name: N/A    Number of Children: N/A  . Years of Education: N/A   Occupational History  . retired    Social History Main Topics  . Smoking status: Former Smoker    Quit date: 01/16/2006  . Smokeless tobacco: Not on file  . Alcohol Use: No  . Drug Use: No  . Sexually Active: Not Currently   Other Topics Concern  . Not on file   Social History Narrative  . No narrative on file    Past Surgical History  Procedure Date  . Coronary artery bypass graft   . Coronary artery bypass graft 1998  . Cholecystectomy   . Prostate surgery   . Back surgery   . Renal stone surgery     3 times  . Throat surgery   . Cardiac catheterization     Family History  Problem Relation Age of Onset  . Coronary artery disease    .  Heart disease Father   . Stroke Father     Allergies  Allergen Reactions  . Vibramycin Other (See Comments)    Causes patient chronic colitis; was hospitalized two weeks due to med.  . Avinza (Morphine Sulfate) Other (See Comments)  . Codeine Sulfate Nausea And Vomiting  . Oxycodone     angry    Current Outpatient Prescriptions on File Prior to Visit  Medication Sig Dispense Refill  . aspirin 81 MG tablet Take 81 mg by mouth daily.        . citalopram (CELEXA) 40 MG tablet Take 1 tablet (40 mg total) by mouth daily.  90 tablet  3  . meloxicam (MOBIC) 15 MG tablet Take 15 mg by mouth daily as needed.        . rosuvastatin  (CRESTOR) 20 MG tablet Take 20 mg by mouth. 1/2 every other day        . Tamsulosin HCl (FLOMAX) 0.4 MG CAPS TAKE ONE CAPSULE BY MOUTH AT BEDTIME  30 capsule  11  . vitamin E (VITAMIN E) 400 UNIT capsule Take 400 Units by mouth daily.          BP 140/80  Pulse 76  Temp 99.2 F (37.3 C)  Resp 16  Ht 5\' 11"  (1.803 m)  Wt 178 lb (80.74 kg)  BMI 24.83 kg/m2    Objective:   Physical Exam  Nursing note and vitals reviewed. Constitutional: He appears well-developed and well-nourished.  HENT:  Head: Normocephalic and atraumatic.       Marked hyperemia and rhinorrhea  Eyes: Conjunctivae are normal. Pupils are equal, round, and reactive to light.  Neck:       Tenderness and loss of range of motion  Cardiovascular: Normal rate and regular rhythm.   Pulmonary/Chest: Effort normal and breath sounds normal.  Abdominal: Soft. Bowel sounds are normal.  Musculoskeletal: He exhibits edema and tenderness.  Neurological: He is alert.  Skin: Skin is warm and dry.          Assessment & Plan:    We will begin the titration on the gabapentin to aid with pain control he will have the paraspinous injections and he is cleared for the injections at this point he has rhinorrhea that appears to be allergic in etiology.  I will start him on a daily is old nasal spray to use 1 spray in each nostril twice daily for symptoms.  He is cleared for his surgical procedure

## 2011-03-21 DIAGNOSIS — M47812 Spondylosis without myelopathy or radiculopathy, cervical region: Secondary | ICD-10-CM | POA: Diagnosis not present

## 2011-04-06 IMAGING — CT CT CERVICAL SPINE W/ CM
3 of 4 series · 15 of 28 positions shown, 17 images · IV contrast (omnipaque)
Comparison: MRI cervical spine 06/10/2009.

MYELOGRAM  INJECTION
TECHNIQUE: Informed consent was obtained from the patient prior to
the procedure, including potential complications of headache,
allergy, infection and pain. Specific instructions were given
regarding 24 hour bedrest post procedure to prevent post-LP
headache.  A timeout procedure was performed.  With the patient
prone, the lower back was prepped with Betadine.  1% Lidocaine was
used for local anesthesia.  Lumbar puncture was performed by the
radiologist at the L2-GTlevel using a 22 gauge needle with return
of clear CSF.  Ninecc of Omnipaque 199was injected into the
subarachnoid space .
CLINICAL DATA: Severe neck pain.  Bilateral shoulder and arm pain.
TECHNIQUE: Multidetector CT imaging of the cervical spine was
performed following myelography.  Multiplanar CT image
reconstructions were also generated.

[Series 2: c spine bone · axial · 0.27mm/px · z∈[-35,+85]mm · 5 of 72 slices shown, 7 images]
[im 12/72  soft-tissue]
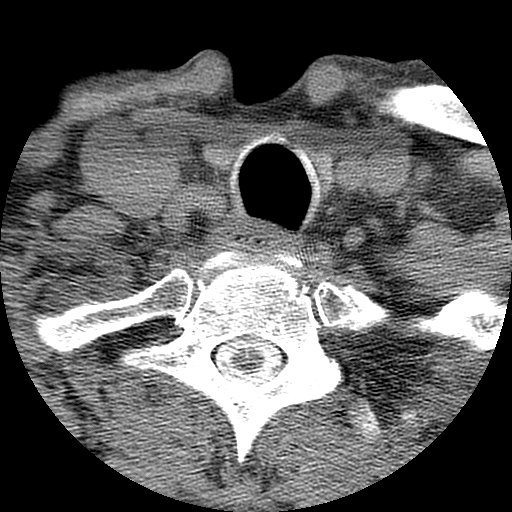
[im 12/72  bone]
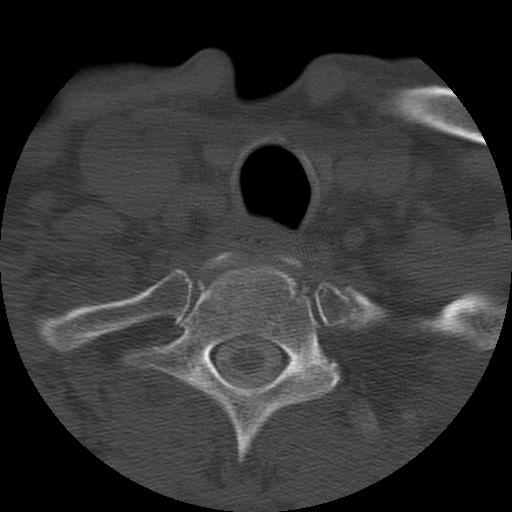
[im 24/72  bone]
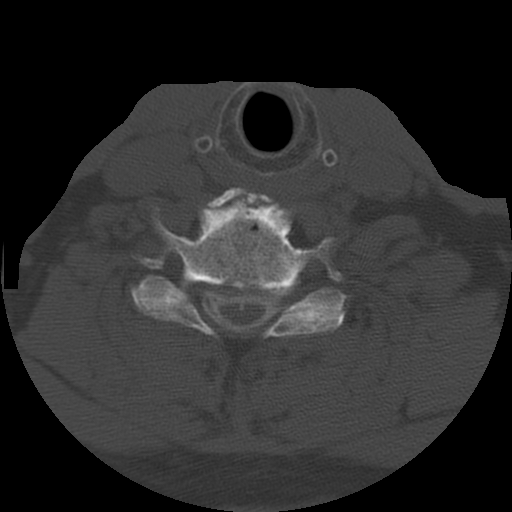
[im 36/72  bone]
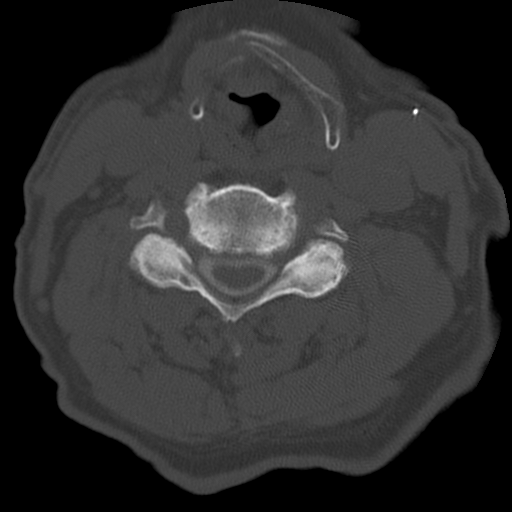
[im 48/72  bone]
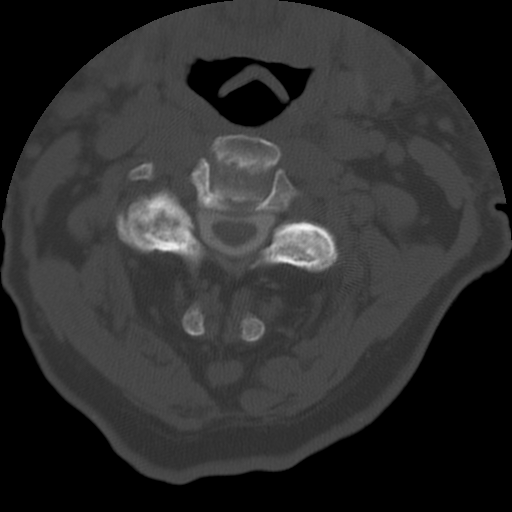
[im 60/72  soft-tissue]
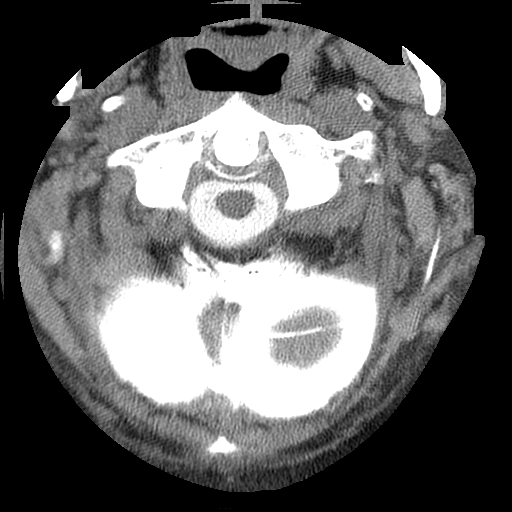
[im 60/72  bone]
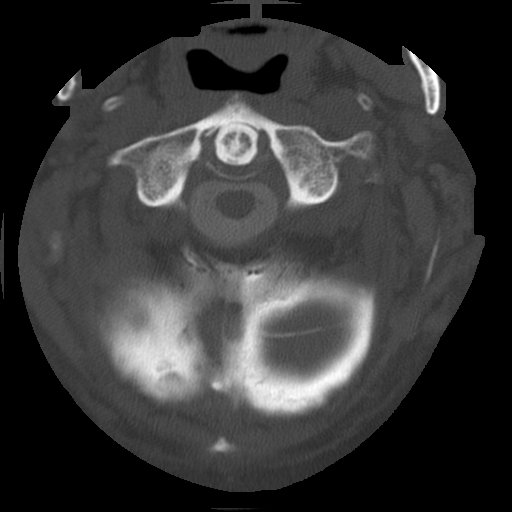

[Series 3: c spine soft · axial · 0.27mm/px · z∈[-25,+58]mm · 4 of 68 slices shown]
[im 12/68  soft-tissue]
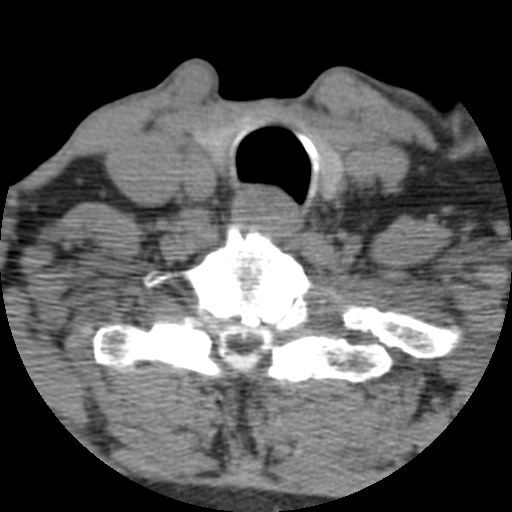
[im 23/68  soft-tissue]
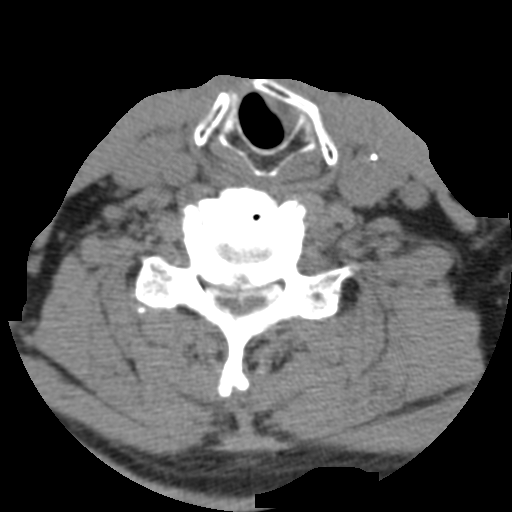
[im 34/68  soft-tissue]
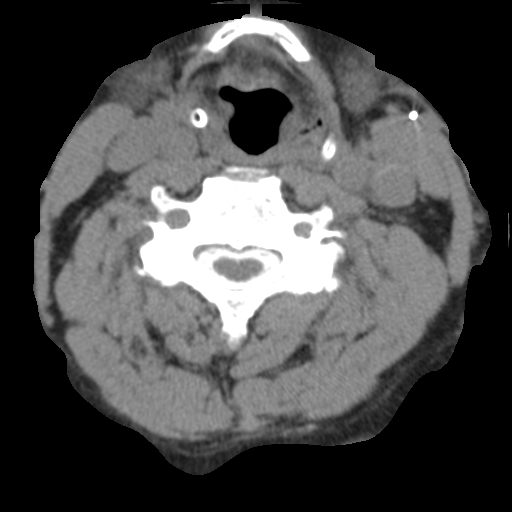
[im 45/68  soft-tissue]
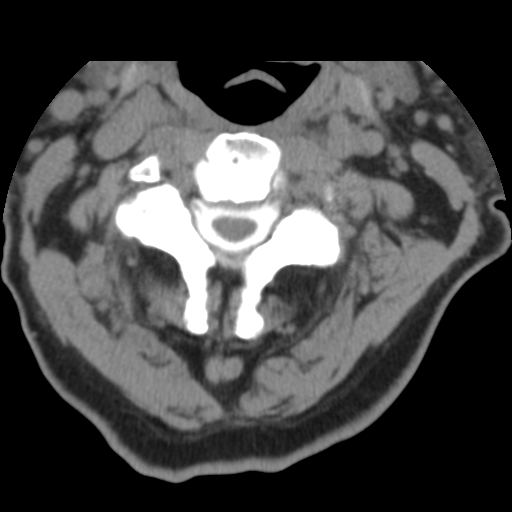

[Series 400: coronal · coronal · 0.35mm/px · 6 of 40 slices shown]
[im 4/40  soft-tissue]
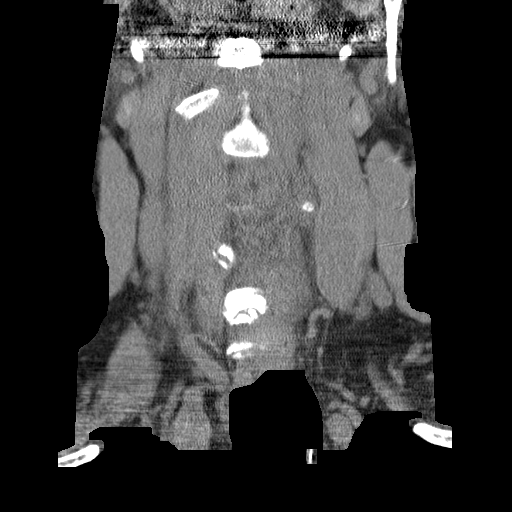
[im 7/40  bone]
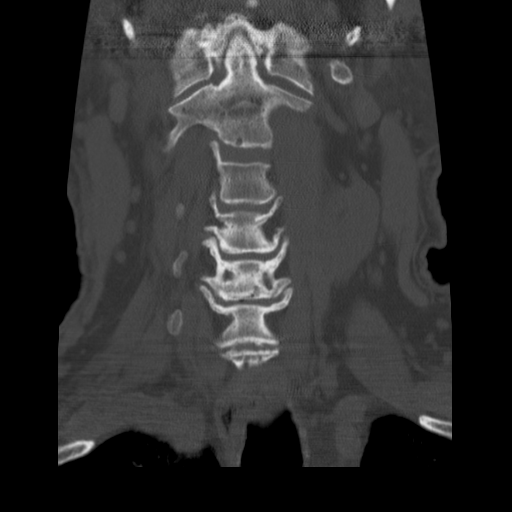
[im 14/40  bone]
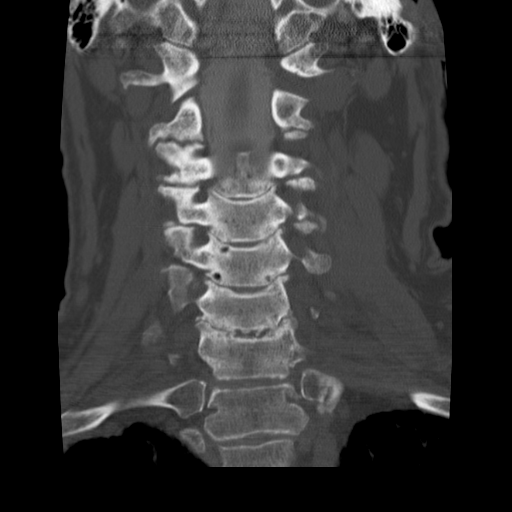
[im 20/40  bone]
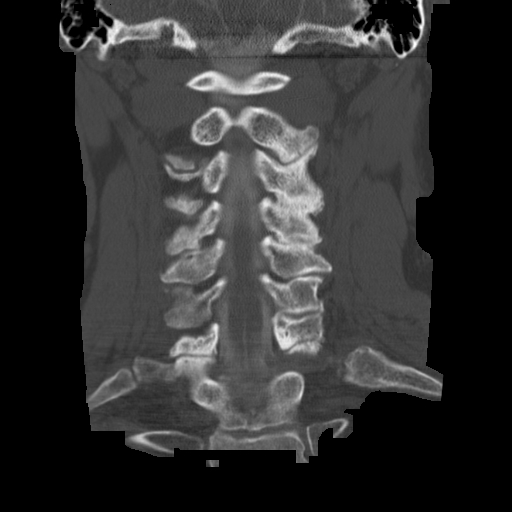
[im 27/40  bone]
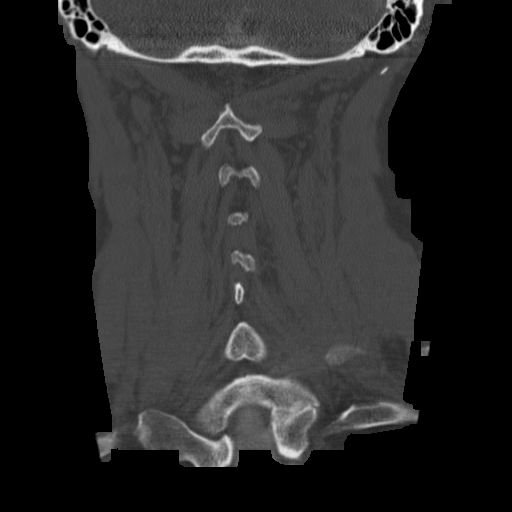
[im 33/40  bone]
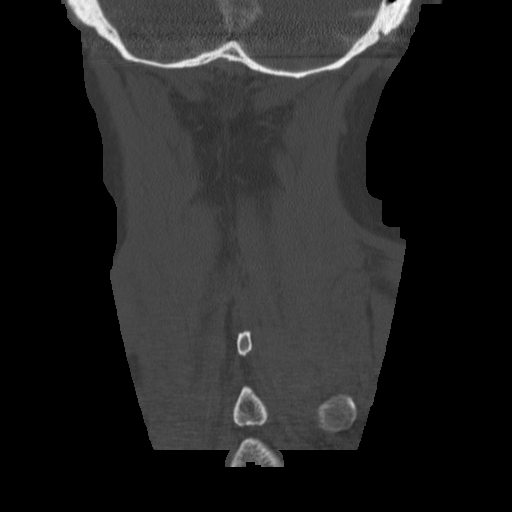

[15 of 28 positions shown; findings below may reference images not displayed]

IMPRESSION: Successful injection of  intrathecal contrast for myelography.

MYELOGRAM CERVICAL
FINDINGS: Good opacification of the cervical subarachnoid space.
Severe stenosis at C4-5 and C5-6 with cord compression.  Severe
bilateral  C6 nerve root encroachment.  Left greater than right C5
nerve root encroachment.  Left  C7 nerve root encroachment is
observed along with  bilateral C8 nerve root cut off.  2 mm
anterolisthesis C2 forward on C3.

Fluoroscopy Time: 4.15 minutes
IMPRESSION: As above

CT MYELOGRAPHY CERVICAL SPINE
The individual disc spaces were examined as follows:

C2-3:  2 mm anterolisthesis, likely facet mediated.  Severe facet
arthropathy on the right with moderate facet disease on the left.
No uncinate spurring.  No nerve root cut off or cord compression.

C3-4:  Disc space narrowing with central osteophyte formation and
annular bulging.  Right greater than left uncinate spurring and
facet arthropathy.  Right C4 nerve root encroachment.  Mild
effacement of the subarachnoid space without significant cord
flattening.

C4-5:  Advanced disc space narrowing with central osteophyte
formation and broad-based central disc protrusion.  Bilateral
uncinate spurring, worse on the right.  Moderate facet arthropathy.
Significant cord compression with canal diameter 6 mm.  Bilateral
C5 nerve root encroachment is also present, right greater than
left.  Early  OPLL extends downward on the right.

C5-6:  Severe disc space narrowing, central osteophyte formation,
and right greater than left uncinate spurring.  1-2  millimeters
retrolisthesis C5 on C6.  Central and rightward disc protrusion.
Ossification of the posterior longitudinal ligament (OPLL) extends
upward behind C5 on the right.  Moderate to severe cord
compression.  Severe bilateral C6 nerve root encroachment.

C6-7:  Advanced disc space narrowing.  Left greater than right
uncinate spurring with mild facet arthropathy.  Central annular
bulging.  Left C7 nerve root encroachment.  No cord compression.
Prominence of the anterior median sulcus of the cord suggests cord
atrophy.

C7-T1:  Ossification of the posterior longitudinal ligament extends
downward behind C7.  There is no spinal stenosis or significant
uncinate spurring.  No  soft disc protrusion.  There is severe
facet arthropathy which likely contributes to the bilateral C8
nerve root encroachment seen myelographically.

The lungs show mild bilateral apical pleural thickening without
visible pulmonary nodule.  Airway patent.  Surgical  clips left
neck reflect treatment of an oropharyngeal malignancy.  No apparent
occlusive or stenotic extracranial vascular calcification.

Comparison with prior MR reveals fairly similar findings.
IMPRESSION: Severe multilevel spinal stenosis, worst at C4-5 and C5-6, as
described.  There is  severe cord compression at both levels along
with bilateral C5 and C6 nerve root encroachment.

## 2011-04-27 DIAGNOSIS — IMO0002 Reserved for concepts with insufficient information to code with codable children: Secondary | ICD-10-CM | POA: Diagnosis not present

## 2011-05-09 ENCOUNTER — Encounter: Payer: Self-pay | Admitting: Internal Medicine

## 2011-05-09 ENCOUNTER — Ambulatory Visit (INDEPENDENT_AMBULATORY_CARE_PROVIDER_SITE_OTHER): Payer: Medicare Other | Admitting: Internal Medicine

## 2011-05-09 VITALS — BP 136/80 | HR 72 | Temp 98.2°F | Resp 16 | Ht 71.0 in | Wt 176.0 lb

## 2011-05-09 DIAGNOSIS — M542 Cervicalgia: Secondary | ICD-10-CM | POA: Diagnosis not present

## 2011-05-09 DIAGNOSIS — F419 Anxiety disorder, unspecified: Secondary | ICD-10-CM

## 2011-05-09 DIAGNOSIS — E785 Hyperlipidemia, unspecified: Secondary | ICD-10-CM

## 2011-05-09 DIAGNOSIS — I10 Essential (primary) hypertension: Secondary | ICD-10-CM | POA: Diagnosis not present

## 2011-05-09 DIAGNOSIS — F411 Generalized anxiety disorder: Secondary | ICD-10-CM | POA: Diagnosis not present

## 2011-05-09 MED ORDER — CLONAZEPAM 0.5 MG PO TABS: 0.2500 mg | ORAL_TABLET | Freq: Two times a day (BID) | ORAL | Status: DC | PRN

## 2011-05-09 MED ORDER — HYDROCODONE-ACETAMINOPHEN 5-500 MG PO TABS: 1.0000 | ORAL_TABLET | Freq: Four times a day (QID) | ORAL | Status: DC | PRN

## 2011-05-09 NOTE — Progress Notes (Signed)
Subjective:    Patient ID: [name], male    DOB: 07-09-1931, 78 y.o.   MRN: [id number]  HPI We tried gabapentin for pain control but he was unable to tolerate it due to urinary retention.  We stopped the medication and urine flow resumed.  He has chronic pain that has been difficult to control because of his reactions to various medications he had a reaction to morphine in the long-acting state codeine and now he has had a reaction to the gabapentin   Review of Systems  Constitutional: Negative for fever and fatigue.  HENT: Negative for hearing loss, congestion, neck pain and postnasal drip.   Eyes: Negative for discharge, redness and visual disturbance.  Respiratory: Negative for cough, shortness of breath and wheezing.   Cardiovascular: Negative for leg swelling.  Gastrointestinal: Negative for abdominal pain, constipation and abdominal distention.  Genitourinary: Negative for urgency and frequency.  Musculoskeletal: Positive for myalgias, back pain, joint swelling, arthralgias and gait problem.  Skin: Negative for color change and rash.  Neurological: Negative for weakness and light-headedness.  Hematological: Negative for adenopathy.  Psychiatric/Behavioral: Negative for behavioral problems.   Past Medical History  Diagnosis Date  . Carotid stenosis     s/p Left CEA, followed by Dr. Arbie Cookey  . Abdominal aortic aneurysm     [place] 11/09 -3.8x3.8cm, 3.8 cm in 2011; followed by Dr. Arbie Cookey  . Radicular low back pain   . BPH (benign prostatic hypertrophy)   . Aphthous ulcer   . Unspecified adverse effect of unspecified drug, medicinal and biological substance   . GERD (gastroesophageal reflux disease)   . History of nephrolithiasis   . Hyperlipidemia   . CAD (coronary artery disease)     s/p CABG 1993;  cath 10/10: severe native 3v CAD, S-Dx ok, S-OM1/OM2 ok, S-PDA ok, L-LAD ok, EF 60%  . Pre-syncope   . 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 Marital Status: Married    Spouse Name: N/A    Number of Children: N/A  . Years of Education: N/A   Occupational History  . retired    Social History Main Topics  . Smoking status: Former Smoker    Quit date: 01/16/2006  . Smokeless tobacco: Not on file  . Alcohol Use: No  . Drug Use: No  . Sexually Active: Not Currently   Other Topics Concern  . Not on file   Social History Narrative  . No narrative on file    Past Surgical History  Procedure Date  . Coronary artery bypass graft   . Coronary artery bypass graft 1998  . Cholecystectomy   . Prostate surgery   . Back surgery   . Renal stone surgery     3 times  . Throat surgery   . Cardiac catheterization     Family History  Problem Relation Age of Onset  . Coronary artery disease    . Heart disease Father   . Stroke Father     Allergies  Allergen Reactions  . Vibramycin Other (See Comments)    Causes patient chronic colitis; was hospitalized two weeks due to med.  . Avinza (Morphine Sulfate) Other (See Comments)  . Codeine Sulfate Nausea And Vomiting  . Oxycodone     angry    Current Outpatient Prescriptions on File Prior to Visit  Medication Sig Dispense Refill  . aspirin 81 MG tablet Take 81 mg by  mouth daily.        . citalopram (CELEXA) 40 MG tablet Take 1 tablet (40 mg total) by mouth daily.  90 tablet  3  . meloxicam (MOBIC) 15 MG tablet Take 15 mg by mouth daily as needed.        . rosuvastatin (CRESTOR) 20 MG tablet Take 20 mg by mouth. 1/2 every other day        . Tamsulosin HCl (FLOMAX) 0.4 MG CAPS TAKE ONE CAPSULE BY MOUTH AT BEDTIME  30 capsule  11  . vitamin E (VITAMIN E) 400 UNIT capsule Take 400 Units by mouth daily.          BP 136/80  Pulse 72  Temp 98.2 F (36.8 C)  Resp 16  Ht 5\' 11"  (1.803 m)  Wt 176 lb (79.833 kg)  BMI 24.55 kg/m2       Objective:   Physical Exam  Nursing note and vitals reviewed. Constitutional: He appears well-developed and  well-nourished.  HENT:  Head: Normocephalic and atraumatic.  Eyes: Conjunctivae are normal. Pupils are equal, round, and reactive to light.  Neck: Normal range of motion. Neck supple.  Cardiovascular: Normal rate and regular rhythm.   Pulmonary/Chest: Effort normal and breath sounds normal.  Abdominal: Soft. Bowel sounds are normal.          Assessment & Plan:  The pain management is with the hydrocodone only at this point has been the only medication that he has been consistently able to tolerate.  He was not able to tolerate long-acting morphine nausea we'll to tolerate the gabapentin.  He is on crestor for lipids  He failed the paraspinal injections

## 2011-05-09 NOTE — Patient Instructions (Signed)
The patient is instructed to continue all medications as prescribed. Schedule followup with check out clerk upon leaving the clinic  

## 2011-05-12 DIAGNOSIS — H2510 Age-related nuclear cataract, unspecified eye: Secondary | ICD-10-CM | POA: Diagnosis not present

## 2011-05-12 DIAGNOSIS — H47019 Ischemic optic neuropathy, unspecified eye: Secondary | ICD-10-CM | POA: Diagnosis not present

## 2011-06-08 DIAGNOSIS — IMO0002 Reserved for concepts with insufficient information to code with codable children: Secondary | ICD-10-CM | POA: Diagnosis not present

## 2011-06-19 ENCOUNTER — Encounter: Payer: Self-pay | Admitting: Neurosurgery

## 2011-06-20 ENCOUNTER — Other Ambulatory Visit (INDEPENDENT_AMBULATORY_CARE_PROVIDER_SITE_OTHER): Payer: Medicare Other | Admitting: *Deleted

## 2011-06-20 ENCOUNTER — Ambulatory Visit (INDEPENDENT_AMBULATORY_CARE_PROVIDER_SITE_OTHER): Payer: Medicare Other | Admitting: *Deleted

## 2011-06-20 ENCOUNTER — Encounter: Payer: Self-pay | Admitting: Neurosurgery

## 2011-06-20 ENCOUNTER — Ambulatory Visit (INDEPENDENT_AMBULATORY_CARE_PROVIDER_SITE_OTHER): Payer: Medicare Other | Admitting: Neurosurgery

## 2011-06-20 VITALS — BP 155/94 | HR 60 | Resp 14 | Ht 71.0 in | Wt 173.0 lb

## 2011-06-20 DIAGNOSIS — I714 Abdominal aortic aneurysm, without rupture, unspecified: Secondary | ICD-10-CM | POA: Insufficient documentation

## 2011-06-20 DIAGNOSIS — I6529 Occlusion and stenosis of unspecified carotid artery: Secondary | ICD-10-CM | POA: Insufficient documentation

## 2011-06-20 DIAGNOSIS — Z48812 Encounter for surgical aftercare following surgery on the circulatory system: Secondary | ICD-10-CM

## 2011-06-20 NOTE — Progress Notes (Signed)
[facility] HISTORY AND PHYSICAL   CC: Annual duplex for known AAA and carotid stenosis Referring Physician: Early  History of Present Illness: 78 year old male patient of Dr. Arbie Cookey is followed for known AAA stable area he also has a history of a left CEA in April 2011 which is also stable. The patient reports no signs or symptoms of CVA, TIA, amaurosis fugax or any neural deficit. Patient also denies any back or abdominal pain.  Past Medical History  Diagnosis Date  . Carotid stenosis     s/p Left CEA, followed by Dr. Arbie Cookey  . Abdominal aortic aneurysm     [place] 11/09 -3.8x3.8cm, 3.8 cm in 2011; followed by Dr. Arbie Cookey  . Radicular low back pain   . BPH (benign prostatic hypertrophy)   . Aphthous ulcer   . Unspecified adverse effect of unspecified drug, medicinal and biological substance   . GERD (gastroesophageal reflux disease)   . History of nephrolithiasis   . Hyperlipidemia   . CAD (coronary artery disease)     s/p CABG 1993;  cath 10/10: severe native 3v CAD, S-Dx ok, S-OM1/OM2 ok, S-PDA ok, L-LAD ok, EF 60%  . Pre-syncope   . 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    ROS: [x]  Positive   [ ]  Denies    General: [ ]  Weight loss, [ ]  Fever, [ ]  chills Neurologic: [ ]  Dizziness, [ ]  Blackouts, [ ]  Seizure [ ]  Stroke, [ ]  "Mini stroke", [ ]  Slurred speech, [ ]  Temporary blindness; [ ]  weakness in arms or legs, [ ]  Hoarseness Cardiac: [ ]  Chest pain/pressure, [ ]  Shortness of breath at rest [ ]  Shortness of breath with exertion, [ ]  Atrial fibrillation or irregular heartbeat Vascular: [ ]  Pain in legs with walking, [ ]  Pain in legs at rest, [ ]  Pain in legs at night,  [ ]  Non-healing ulcer, [ ]  Blood clot in vein/DVT,   Pulmonary: [ ]  Home oxygen, [ ]  Productive cough, [ ]  Coughing up blood, [ ]  Asthma,  [ ]  Wheezing Musculoskeletal:  [ ]  Arthritis, [ ]  Low back pain, [ ]  Joint pain Hematologic: [ ]  Easy Bruising, [ ]  Anemia; [ ]   Hepatitis Gastrointestinal: [ ]  Blood in stool, [ ]  Gastroesophageal Reflux/heartburn, [ ]  Trouble swallowing Urinary: [ ]  chronic Kidney disease, [ ]  on HD - [ ]  MWF or [ ]  TTHS, [ ]  Burning with urination, [ ]  Difficulty urinating Skin: [ ]  Rashes, [ ]  Wounds Psychological: [ ]  Anxiety, [ ]  Depression   Social History History  Substance Use Topics  . Smoking status: Former Smoker    Quit date: 01/16/2006  . Smokeless tobacco: Not on file  . Alcohol Use: No    Family History Family History  Problem Relation Age of Onset  . Coronary artery disease    . Heart disease Father   . Stroke Father     Allergies  Allergen Reactions  . Doxycycline Calcium Other (See Comments)    Causes patient chronic colitis; was hospitalized two weeks due to med.  . Avinza (Morphine Sulfate) Other (See Comments)  . Codeine Sulfate Nausea And Vomiting  . Oxycodone     angry    Current Outpatient Prescriptions  Medication Sig Dispense Refill  . aspirin 81 MG tablet Take 81 mg by mouth daily.        . citalopram (CELEXA) 40 MG tablet Take 1 tablet (40 mg total) by  mouth daily.  90 tablet  3  . clonazePAM (KLONOPIN) 0.5 MG tablet Take 0.25 mg by mouth 2 (two) times daily as needed.      Marland Kitchen HYDROcodone-acetaminophen (VICODIN) 5-500 MG per tablet Take 1 tablet by mouth every 6 (six) hours as needed for pain.  30 tablet    . meloxicam (MOBIC) 15 MG tablet Take 15 mg by mouth daily as needed.        . rosuvastatin (CRESTOR) 20 MG tablet Take 20 mg by mouth. 1/2 every other day        . Tamsulosin HCl (FLOMAX) 0.4 MG CAPS TAKE ONE CAPSULE BY MOUTH AT BEDTIME  30 capsule  11  . vitamin E (VITAMIN E) 400 UNIT capsule Take 400 Units by mouth daily.        Marland Kitchen DISCONTD: clonazePAM (KLONOPIN) 0.5 MG tablet Take 0.5 tablets (0.25 mg total) by mouth 2 (two) times daily as needed for anxiety.  30 tablet  2    Physical Examination  Filed Vitals:   06/20/11 1022  BP: 155/94  Pulse:   Resp:     Body mass  index is 24.13 kg/(m^2).  General:  WDWN in NAD Gait: Normal HEENT: WNL Eyes: Pupils equal Pulmonary: normal non-labored breathing , without Rales, rhonchi,  wheezing Cardiac: RRR, without  Murmurs, rubs or gallops; Abdomen: soft, NT, no masses Skin: no rashes, ulcers noted  Vascular Exam Pulses: 2+ radial pulses bilaterally, 2+ femoral pulses bilaterally no abdominal mass is palpated Carotid bruits: Carotid pulses to auscultation bilaterally no bruits are heard Extremities without ischemic changes, no Gangrene , no cellulitis; no open wounds;  Musculoskeletal: no muscle wasting or atrophy   Neurologic: A&O X 3; Appropriate Affect ; SENSATION: normal; MOTOR FUNCTION:  moving all extremities equally. Speech is fluent/normal  Non-Invasive Vascular Imaging CAROTID DUPLEX 06/20/2011  Right ICA 20 - 39 % stenosis Left ICA 20 - 39 % stenosis AAA duplex shows a mid AP of 3.0, transverse 3.2 which is unchanged from previous exam  ASSESSMENT/PLAN: Stable mild carotid stenosis, stable AAA with no change in diameter. The patient return in one year for repeat carotid duplex as well as AAA duplex. His questions were encouraged and answered. The patient knows signs and symptoms of CVA as well as AAA rupture and knows to report to the emergency room if that should occur.  Lauree Chandler ANP   Clinic MD: Early

## 2011-06-21 NOTE — Progress Notes (Signed)
Addended by: Sharee Pimple on: 06/21/2011 10:01 AM   Modules accepted: Orders

## 2011-06-28 NOTE — Procedures (Unsigned)
CAROTID DUPLEX EXAM  INDICATION:  Carotid endarterectomy  HISTORY: Diabetes:  No Cardiac:  Yes Hypertension:  Yes Smoking:  Previous Previous Surgery:  Left carotid endarterectomy on 04/20/2009 CV History:  Currently asymptomatic Amaurosis Fugax No, Paresthesias No, Hemiparesis No                                      RIGHT             LEFT Brachial systolic pressure:         142               132 Brachial Doppler waveforms:         Normal            Normal Vertebral direction of flow:        Antegrade         Antegrade DUPLEX VELOCITIES (cm/sec) CCA peak systolic                   65                59 ECA peak systolic                   54                78 ICA peak systolic                   58                59 ICA end diastolic                   15                10 PLAQUE MORPHOLOGY: PLAQUE AMOUNT:                      None              None PLAQUE LOCATION:  IMPRESSION: 1. Patent left carotid endarterectomy site with no bilateral internal     carotid artery stenoses noted. 2. No significant change noted when compared to the previous exam on     06/16/2010.         ___________________________________________ Larina Earthly, M.D.  CH/MEDQ  D:  06/23/2011  T:  06/23/2011  Job:  [id number]

## 2011-06-28 NOTE — Procedures (Unsigned)
DUPLEX ULTRASOUND OF ABDOMINAL AORTA  INDICATION:  Abdominal aortic aneurysm  HISTORY: Diabetes:  No Cardiac:  Yes Hypertension:  Yes Smoking:  Previous Connective Tissue Disorder: Family History:  No Previous Surgery:  No  DUPLEX EXAM:         AP (cm)                   TRANSVERSE (cm) Proximal             2.9 cm                    2.9 cm Mid                  3.0 cm                    3.2 cm Distal               3.5 cm                    3.4 cm Right Iliac          1.1 cm                    1.1 cm Left Iliac           0.9 cm                    1.0 cm  PREVIOUS:  Date:  12/13/2010  AP:  3.37  TRANSVERSE:  3.68  IMPRESSION:  Aneurysmal dilatation noted in the mid to distal abdominal aorta with no significant change in maximum diameter when compared to the previous exam.  ___________________________________________ Larina Earthly, M.D.  CH/MEDQ  D:  06/23/2011  T:  06/23/2011  Job:  [id number]

## 2011-07-17 ENCOUNTER — Telehealth: Payer: Self-pay | Admitting: Internal Medicine

## 2011-07-17 NOTE — Telephone Encounter (Signed)
New msg Pt's wife wants to know if he should see Dr Gala Romney at hospital or see another provider here

## 2011-07-17 NOTE — Telephone Encounter (Signed)
Spoke with pt, they are going to talk to their dtr at the hosp and see what other cardiologist she thinks he should see and call me back.

## 2011-07-17 NOTE — Telephone Encounter (Signed)
Spoke with pt wife, they requested dr cooper. Follow up appt made

## 2011-08-07 ENCOUNTER — Other Ambulatory Visit: Payer: Self-pay | Admitting: Internal Medicine

## 2011-08-08 ENCOUNTER — Encounter: Payer: Self-pay | Admitting: Internal Medicine

## 2011-08-08 ENCOUNTER — Ambulatory Visit (INDEPENDENT_AMBULATORY_CARE_PROVIDER_SITE_OTHER): Payer: Medicare Other | Admitting: Internal Medicine

## 2011-08-08 VITALS — BP 126/80 | HR 72 | Temp 98.6°F | Resp 16 | Ht 71.0 in | Wt 175.0 lb

## 2011-08-08 DIAGNOSIS — R351 Nocturia: Secondary | ICD-10-CM

## 2011-08-08 DIAGNOSIS — N401 Enlarged prostate with lower urinary tract symptoms: Secondary | ICD-10-CM | POA: Diagnosis not present

## 2011-08-08 DIAGNOSIS — I10 Essential (primary) hypertension: Secondary | ICD-10-CM

## 2011-08-08 DIAGNOSIS — E785 Hyperlipidemia, unspecified: Secondary | ICD-10-CM

## 2011-08-08 DIAGNOSIS — T887XXA Unspecified adverse effect of drug or medicament, initial encounter: Secondary | ICD-10-CM

## 2011-08-08 DIAGNOSIS — N138 Other obstructive and reflux uropathy: Secondary | ICD-10-CM

## 2011-08-08 LAB — LIPID PANEL: Total CHOL/HDL Ratio: 3

## 2011-08-08 LAB — HEPATIC FUNCTION PANEL
AST: 26 U/L (ref 0–37)
Alkaline Phosphatase: 70 U/L (ref 39–117)
Bilirubin, Direct: 0 mg/dL (ref 0.0–0.3)
Total Bilirubin: 0.9 mg/dL (ref 0.3–1.2)

## 2011-08-08 LAB — BASIC METABOLIC PANEL
CO2: 28 mEq/L (ref 19–32)
Calcium: 8.9 mg/dL (ref 8.4–10.5)
Creatinine, Ser: 1.2 mg/dL (ref 0.4–1.5)
GFR: 65.33 mL/min (ref >60.00)
Glucose, Bld: 83 mg/dL (ref 70–99)
Sodium: 143 mEq/L (ref 135–145)

## 2011-08-08 LAB — PSA: PSA: 4.15 ng/mL — ABNORMAL HIGH (ref 0.10–4.00)

## 2011-08-08 NOTE — Progress Notes (Signed)
Subjective:    Patient ID: [name], male    DOB: [birth year], 78 y.o.   MRN: [id number]  HPI Patient is 78 year old man with multiple medical problems and primary complaint of severe neck and back pain.  He has been on multiple regimens for control the pain however when he takes any pain medicine containing a narcotic it creates side effects are unpleasant to him including sensation of dizziness.  We discussed the use of a single nonsteroidal rhythm multiple different nonsteroidals and the as well as a basic metabolic panel for blood pressure control and the fact that he is on nonsteroidals.  use of tylenol for when necessary pain. Blood pressure stable his current medications his weight is stable today   Review of Systems  Constitutional: Positive for fatigue.  HENT: Positive for neck pain and neck stiffness.   Cardiovascular: Positive for palpitations and leg swelling.  Genitourinary: Positive for frequency.   Past Medical History  Diagnosis Date  . Carotid stenosis     s/p Left CEA, followed by Dr. Arbie Cookey  . Abdominal aortic aneurysm     [place] 11/09 -3.8x3.8cm, 3.8 cm in 2011; followed by Dr. Arbie Cookey  . Radicular low back pain   . BPH (benign prostatic hypertrophy)   . Aphthous ulcer   . Unspecified adverse effect of unspecified drug, medicinal and biological substance   . GERD (gastroesophageal reflux disease)   . History of nephrolithiasis   . Hyperlipidemia   . CAD (coronary artery disease)     s/p CABG 1993;  cath 10/10: severe native 3v CAD, S-Dx ok, S-OM1/OM2 ok, S-PDA ok, L-LAD ok, EF 60%  . Pre-syncope   . 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 Marital Status: Married    Spouse Name: N/A    Number of Children: N/A  . Years of Education: N/A   Occupational History  . retired    Social History Main Topics  . Smoking status: Former Smoker    Quit date: 01/16/2006  . Smokeless tobacco: Not on file  . Alcohol Use: No   . Drug Use: No  . Sexually Active: Not Currently   Other Topics Concern  . Not on file   Social History Narrative  . No narrative on file    Past Surgical History  Procedure Date  . Coronary artery bypass graft   . Coronary artery bypass graft 1998  . Cholecystectomy   . Prostate surgery   . Back surgery   . Renal stone surgery     3 times  . Throat surgery   . Cardiac catheterization     Family History  Problem Relation Age of Onset  . Coronary artery disease    . Heart disease Father   . Stroke Father     Allergies  Allergen Reactions  . Doxycycline Calcium Other (See Comments)    Causes patient chronic colitis; was hospitalized two weeks due to med.  . Avinza (Morphine Sulfate) Other (See Comments)  . Codeine Sulfate Nausea And Vomiting  . Oxycodone     angry    Current Outpatient Prescriptions on File Prior to Visit  Medication Sig Dispense Refill  . aspirin 81 MG tablet Take 81 mg by mouth daily.        . citalopram (CELEXA) 40 MG tablet TAKE ONE TABLET BY MOUTH DAILY.  90 tablet  3  . clonazePAM (KLONOPIN) 0.5 MG tablet Take 0.25  mg by mouth 2 (two) times daily as needed.      . meloxicam (MOBIC) 15 MG tablet Take 15 mg by mouth daily as needed.        . rosuvastatin (CRESTOR) 20 MG tablet Take 20 mg by mouth. 1/2 every other day        . Tamsulosin HCl (FLOMAX) 0.4 MG CAPS TAKE ONE CAPSULE BY MOUTH AT BEDTIME  30 capsule  11  . vitamin E (VITAMIN E) 400 UNIT capsule Take 400 Units by mouth daily.          BP 126/80  Pulse 72  Temp 98.6 F (37 C)  Resp 16  Ht 5\' 11"  (1.803 m)  Wt 175 lb (79.379 kg)  BMI 24.41 kg/m2       Objective:   Physical Exam  Constitutional: He appears well-developed and well-nourished.  HENT:  Head: Normocephalic and atraumatic.  Eyes: Conjunctivae are normal. Pupils are equal, round, and reactive to light.  Neck: Normal range of motion. Neck supple.  Cardiovascular: Normal rate and regular rhythm.   Murmur  heard. Pulmonary/Chest: Effort normal and breath sounds normal.  Abdominal: Soft. Bowel sounds are normal.          Assessment & Plan:  Patient has a followup with cardiology for his previous episode of symptomatic bradycardia.  His pulse is now between 60 and 70 and stable as well as stable blood pressure.  He is on Crestor 20 mg and we will measure a lipid and liver And a basic metabolic panel He is on Flomax for BPH and PSA will be measured

## 2011-08-08 NOTE — Patient Instructions (Addendum)
I would prefer that he take the Mobic every single day to lower the inflammation and then Tylenol up to 3 times a day for acute pain  Be sure to take the Flomax every night so that you don't have to wake up so much to urinate

## 2011-09-27 ENCOUNTER — Other Ambulatory Visit: Payer: Self-pay | Admitting: Internal Medicine

## 2011-10-03 ENCOUNTER — Encounter: Payer: Self-pay | Admitting: Cardiovascular Disease

## 2011-10-03 ENCOUNTER — Ambulatory Visit (INDEPENDENT_AMBULATORY_CARE_PROVIDER_SITE_OTHER): Payer: Medicare Other | Admitting: Cardiovascular Disease

## 2011-10-03 VITALS — BP 148/80 | HR 65 | Ht 71.0 in | Wt 177.0 lb

## 2011-10-03 DIAGNOSIS — I2510 Atherosclerotic heart disease of native coronary artery without angina pectoris: Secondary | ICD-10-CM

## 2011-10-03 NOTE — Progress Notes (Signed)
HPI:  78 year old gentleman presenting for followup evaluation. The patient has been followed by Dr. Gala Romney and I will be assuming his cardiac care from here forward. He has coronary artery disease status post remote CABG in 1993. His last heart catheterization was in 2010 when he had continued patency of all 5 of his bypass grafts. He also has carotid arterial disease and he is status post left carotid endarterectomy. His carotid disease is been followed by vascular surgery and his lipids are been followed by Dr. Lovell Sheehan.  The patient has no cardiac related complaints. He denies chest pain or pressure, dyspnea, edema, or palpitations. He has chronic dizziness but this is not a postural complaint. He thinks it is related to his neck problems. The patient is limited by chronic pain issues.  Outpatient Encounter Prescriptions as of 10/03/2011  Medication Sig Dispense Refill  . aspirin 81 MG tablet Take 81 mg by mouth daily.        . citalopram (CELEXA) 40 MG tablet TAKE ONE TABLET BY MOUTH DAILY.  90 tablet  3  . meloxicam (MOBIC) 15 MG tablet TAKE ONE TABLET BY MOUTH EVERY DAY AS NEEDED  30 tablet  3  . rosuvastatin (CRESTOR) 20 MG tablet Take by mouth. 1/2 every other day       . vitamin E (VITAMIN E) 400 UNIT capsule Take 400 Units by mouth daily.        . clonazePAM (KLONOPIN) 0.5 MG tablet Take 0.25 mg by mouth 2 (two) times daily as needed.      . Tamsulosin HCl (FLOMAX) 0.4 MG CAPS TAKE ONE CAPSULE BY MOUTH AT BEDTIME  30 capsule  11    Allergies  Allergen Reactions  . Doxycycline Calcium Other (See Comments)    Causes patient chronic colitis; was hospitalized two weeks due to med.  . Avinza (Morphine Sulfate) Other (See Comments)  . Codeine Sulfate Nausea And Vomiting  . Oxycodone     angry    Past Medical History  Diagnosis Date  . Carotid stenosis     s/p Left CEA, followed by Dr. Arbie Cookey  . Abdominal aortic aneurysm     [place] 11/09 -3.8x3.8cm, 3.8 cm in 2011; followed by Dr.  Arbie Cookey  . Radicular low back pain   . BPH (benign prostatic hypertrophy)   . Aphthous ulcer   . Unspecified adverse effect of unspecified drug, medicinal and biological substance   . GERD (gastroesophageal reflux disease)   . History of nephrolithiasis   . Hyperlipidemia   . CAD (coronary artery disease)     s/p CABG 1993;  cath 10/10: severe native 3v CAD, S-Dx ok, S-OM1/OM2 ok, S-PDA ok, L-LAD ok, EF 60%  . Pre-syncope   . 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  ASSESSMENT AND PLAN: 1. CAD status post remote CABG. The patient is stable without anginal symptoms. He will remain on his current medical program. He takes aspirin for antiplatelet therapy and  Crestor for lipid lowering. His blood pressure has been normal on previous visits which I have reviewed today. He is not on a beta blocker because of history of symptomatic bradycardia.  2. Symptomatic bradycardia. This has resolved and he is having no symptoms at the present time. His heart rate is within normal limits.  3. Hyperlipidemia. Total cholesterol 134, HDL 48, LDL 63, triglycerides 114. Lipids are followed by Dr. Lovell Sheehan.

## 2011-10-03 NOTE — Patient Instructions (Addendum)
Your physician wants you to follow-up in: 1 YEAR with Dr Cooper.  You will receive a reminder letter in the mail two months in advance. If you don't receive a letter, please call our office to schedule the follow-up appointment.  Your physician recommends that you continue on your current medications as directed. Please refer to the Current Medication list given to you today.  

## 2011-10-16 ENCOUNTER — Telehealth: Payer: Self-pay | Admitting: Internal Medicine

## 2011-10-16 NOTE — Telephone Encounter (Signed)
Caller: [name] daughter ; Patient Name: [name]; PCP: Darryll Capers (Adults only); Best Callback Phone Number: [phone]; Reason for call: Other Onset- 10/16/11 Temp- 95.7  Spoke with wife [name] [phone]. Vomiting and Dizziness, dizziness  is a chronic problem.  Vomiting x 4-5 this morning.  Wife wonders if Tamulosin is causing symptoms because he just started taking it again on regular basis. Wife gave him OTC medication for motion sickness to see if that would help. Emergent s/s of Dizziness and Vomiting protocol r/o. Pt to see provider within 24hrs. Wife states he is difficult to get into the office for an appointment and will only see Dr. Lovell Sheehan. No appointments seen today or tomorrow for Dr. Lovell Sheehan. Message sent.

## 2011-10-16 NOTE — Telephone Encounter (Signed)
Talked with wife- wants him to see someone about chronic dizziness-stopped tamsolosin -some better

## 2011-10-16 NOTE — Telephone Encounter (Signed)
Talked with wife- per dr Lovell Sheehan- can try vestibular rehab-- wife states why?  They didn't help her- she asked husband if he wanted to go to rehab and he said no/

## 2011-11-08 ENCOUNTER — Ambulatory Visit: Payer: Medicare Other | Admitting: Internal Medicine

## 2011-11-14 ENCOUNTER — Ambulatory Visit (INDEPENDENT_AMBULATORY_CARE_PROVIDER_SITE_OTHER): Payer: Medicare Other | Admitting: Internal Medicine

## 2011-11-14 ENCOUNTER — Encounter: Payer: Self-pay | Admitting: Internal Medicine

## 2011-11-14 VITALS — BP 140/80 | HR 72 | Temp 98.6°F | Resp 16 | Ht 71.0 in | Wt 176.0 lb

## 2011-11-14 DIAGNOSIS — Z23 Encounter for immunization: Secondary | ICD-10-CM | POA: Diagnosis not present

## 2011-11-14 DIAGNOSIS — R42 Dizziness and giddiness: Secondary | ICD-10-CM

## 2011-11-14 DIAGNOSIS — H8309 Labyrinthitis, unspecified ear: Secondary | ICD-10-CM | POA: Diagnosis not present

## 2011-11-14 DIAGNOSIS — H8190 Unspecified disorder of vestibular function, unspecified ear: Secondary | ICD-10-CM

## 2011-11-14 NOTE — Patient Instructions (Signed)
The patient is instructed to continue all medications as prescribed. Schedule followup with check out clerk upon leaving the clinic  

## 2011-11-14 NOTE — Progress Notes (Signed)
Subjective:    Patient ID: [name], male    DOB: February 19, 1931, 78 y.o.   MRN: [id number]  HPI    Review of Systems  Constitutional: Negative for fever and fatigue.  HENT: Positive for hearing loss, ear pain, postnasal drip and tinnitus. Negative for congestion and neck pain.   Eyes: Positive for visual disturbance. Negative for discharge and redness.  Respiratory: Negative for cough, shortness of breath and wheezing.   Cardiovascular: Negative for leg swelling.  Gastrointestinal: Negative for abdominal pain, constipation and abdominal distention.  Genitourinary: Negative for urgency and frequency.  Musculoskeletal: Negative for joint swelling and arthralgias.  Skin: Negative for color change and rash.  Neurological: Positive for dizziness. Negative for weakness and light-headedness.  Hematological: Negative for adenopathy.  Psychiatric/Behavioral: Negative for behavioral problems.   Past Medical History  Diagnosis Date  . Carotid stenosis     s/p Left CEA, followed by Dr. Arbie Cookey  . Abdominal aortic aneurysm     [place] 11/09 -3.8x3.8cm, 3.8 cm in 2011; followed by Dr. Arbie Cookey  . Radicular low back pain   . BPH (benign prostatic hypertrophy)   . Aphthous ulcer   . Unspecified adverse effect of unspecified drug, medicinal and biological substance   . GERD (gastroesophageal reflux disease)   . History of nephrolithiasis   . Hyperlipidemia   . CAD (coronary artery disease)     s/p CABG 1993;  cath 10/10: severe native 3v CAD, S-Dx ok, S-OM1/OM2 ok, S-PDA ok, L-LAD ok, EF 60%  . Pre-syncope   . 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 Marital Status: Married    Spouse Name: N/A    Number of Children: N/A  . Years of Education: N/A   Occupational History  . retired    Social History Main Topics  . Smoking status: Former Smoker    Quit date: 01/16/2006  . Smokeless tobacco: Not on file  . Alcohol Use: No  . Drug Use: No  .  Sexually Active: Not Currently   Other Topics Concern  . Not on file   Social History Narrative  . No narrative on file    Past Surgical History  Procedure Date  . Coronary artery bypass graft   . Coronary artery bypass graft 1998  . Cholecystectomy   . Prostate surgery   . Back surgery   . Renal stone surgery     3 times  . Throat surgery   . Cardiac catheterization     Family History  Problem Relation Age of Onset  . Coronary artery disease    . Heart disease Father   . Stroke Father     Allergies  Allergen Reactions  . Doxycycline Calcium Other (See Comments)    Causes patient chronic colitis; was hospitalized two weeks due to med.  . Avinza (Morphine Sulfate) Other (See Comments)  . Codeine Sulfate Nausea And Vomiting  . Oxycodone     angry    Current Outpatient Prescriptions on File Prior to Visit  Medication Sig Dispense Refill  . aspirin 81 MG tablet Take 81 mg by mouth daily.        . citalopram (CELEXA) 40 MG tablet TAKE ONE TABLET BY MOUTH DAILY.  90 tablet  3  . clonazePAM (KLONOPIN) 0.5 MG tablet Take 0.25 mg by mouth 2 (two) times daily as needed.      . meloxicam (MOBIC) 15 MG tablet TAKE ONE  TABLET BY MOUTH EVERY DAY AS NEEDED  30 tablet  3  . rosuvastatin (CRESTOR) 20 MG tablet Take by mouth. 1/2 every other day       . vitamin E (VITAMIN E) 400 UNIT capsule Take 400 Units by mouth daily.          BP 140/80  Pulse 72  Temp 98.6 F (37 C)  Resp 16  Ht 5\' 11"  (1.803 m)  Wt 176 lb (79.833 kg)  BMI 24.55 kg/m2       Objective:   Physical Exam  Vitals reviewed. Constitutional: He appears well-developed and well-nourished.  HENT:  Head: Normocephalic and atraumatic.  Eyes: Conjunctivae normal are normal. Pupils are equal, round, and reactive to light.  Neck: Normal range of motion. Neck supple.  Cardiovascular: Normal rate and regular rhythm.   Murmur heard. Pulmonary/Chest: Effort normal and breath sounds normal.  Abdominal: Soft.  Bowel sounds are normal.          Assessment & Plan:  Mobic for pain control stable Chronic labyrinthitis with intermitted dizziness.  Refer to physical therapy for treatment.  Continue with Crestor 20 mg every other day Table peripheral and CAD

## 2011-12-05 ENCOUNTER — Ambulatory Visit: Payer: Medicare Other | Attending: Internal Medicine | Admitting: Rehabilitative and Restorative Service Providers"

## 2011-12-05 DIAGNOSIS — R269 Unspecified abnormalities of gait and mobility: Secondary | ICD-10-CM | POA: Diagnosis not present

## 2011-12-05 DIAGNOSIS — IMO0001 Reserved for inherently not codable concepts without codable children: Secondary | ICD-10-CM | POA: Insufficient documentation

## 2011-12-05 DIAGNOSIS — R42 Dizziness and giddiness: Secondary | ICD-10-CM | POA: Diagnosis not present

## 2011-12-18 ENCOUNTER — Ambulatory Visit: Payer: Medicare Other | Attending: Internal Medicine | Admitting: Rehabilitative and Restorative Service Providers"

## 2011-12-18 DIAGNOSIS — R269 Unspecified abnormalities of gait and mobility: Secondary | ICD-10-CM | POA: Insufficient documentation

## 2011-12-18 DIAGNOSIS — IMO0001 Reserved for inherently not codable concepts without codable children: Secondary | ICD-10-CM | POA: Insufficient documentation

## 2011-12-18 DIAGNOSIS — R42 Dizziness and giddiness: Secondary | ICD-10-CM | POA: Diagnosis not present

## 2011-12-21 ENCOUNTER — Ambulatory Visit: Payer: Medicare Other | Admitting: Rehabilitative and Restorative Service Providers"

## 2011-12-26 ENCOUNTER — Ambulatory Visit: Payer: Medicare Other | Admitting: Rehabilitative and Restorative Service Providers"

## 2011-12-29 ENCOUNTER — Ambulatory Visit: Payer: Medicare Other | Admitting: Rehabilitative and Restorative Service Providers"

## 2012-01-01 ENCOUNTER — Ambulatory Visit: Payer: Medicare Other | Admitting: Rehabilitative and Restorative Service Providers"

## 2012-01-04 ENCOUNTER — Ambulatory Visit: Payer: Medicare Other | Admitting: Rehabilitative and Restorative Service Providers"

## 2012-01-23 ENCOUNTER — Ambulatory Visit: Payer: Medicare Other | Attending: Internal Medicine | Admitting: Rehabilitative and Restorative Service Providers"

## 2012-01-23 DIAGNOSIS — IMO0001 Reserved for inherently not codable concepts without codable children: Secondary | ICD-10-CM | POA: Insufficient documentation

## 2012-01-23 DIAGNOSIS — R42 Dizziness and giddiness: Secondary | ICD-10-CM | POA: Insufficient documentation

## 2012-01-23 DIAGNOSIS — R269 Unspecified abnormalities of gait and mobility: Secondary | ICD-10-CM | POA: Insufficient documentation

## 2012-03-12 ENCOUNTER — Ambulatory Visit (INDEPENDENT_AMBULATORY_CARE_PROVIDER_SITE_OTHER): Payer: Medicare Other | Admitting: Internal Medicine

## 2012-03-12 ENCOUNTER — Encounter: Payer: Self-pay | Admitting: Internal Medicine

## 2012-03-12 VITALS — BP 110/70 | HR 72 | Temp 98.2°F | Resp 16 | Ht 71.0 in | Wt 176.0 lb

## 2012-03-12 DIAGNOSIS — N139 Obstructive and reflux uropathy, unspecified: Secondary | ICD-10-CM | POA: Diagnosis not present

## 2012-03-12 DIAGNOSIS — N401 Enlarged prostate with lower urinary tract symptoms: Secondary | ICD-10-CM | POA: Diagnosis not present

## 2012-03-12 DIAGNOSIS — M549 Dorsalgia, unspecified: Secondary | ICD-10-CM | POA: Diagnosis not present

## 2012-03-12 DIAGNOSIS — I10 Essential (primary) hypertension: Secondary | ICD-10-CM

## 2012-03-12 DIAGNOSIS — N138 Other obstructive and reflux uropathy: Secondary | ICD-10-CM

## 2012-03-12 MED ORDER — TERAZOSIN HCL 2 MG PO CAPS: 2.0000 mg | ORAL_CAPSULE | Freq: Every day | ORAL | Status: DC

## 2012-03-12 NOTE — Patient Instructions (Signed)
Wear both hearting aids

## 2012-03-12 NOTE — Progress Notes (Signed)
Subjective:    Patient ID: [name], male    DOB: [birth year], 79 y.o.   MRN: [id number]  HPI  Depressed Due to chronic pain Memory stable HTN stable BPH with symptomatic obstruction  Review of Systems  Constitutional: Positive for fatigue. Negative for fever.  HENT: Negative for hearing loss, congestion, neck pain and postnasal drip.   Eyes: Negative for discharge, redness and visual disturbance.  Respiratory: Negative for cough, shortness of breath and wheezing.   Cardiovascular: Negative for leg swelling.  Gastrointestinal: Negative for abdominal pain, constipation and abdominal distention.  Genitourinary: Negative for urgency and frequency.  Musculoskeletal: Negative for joint swelling and arthralgias.  Skin: Negative for color change and rash.  Neurological: Positive for light-headedness. Negative for syncope and weakness.  Hematological: Negative for adenopathy.  Psychiatric/Behavioral: Negative for behavioral problems.   Past Medical History  Diagnosis Date  . Carotid stenosis     s/p Left CEA, followed by Dr. Arbie Cookey  . Abdominal aortic aneurysm     [place] 11/09 -3.8x3.8cm, 3.8 cm in 2011; followed by Dr. Arbie Cookey  . Radicular low back pain   . BPH (benign prostatic hypertrophy)   . Aphthous ulcer   . Unspecified adverse effect of unspecified drug, medicinal and biological substance   . GERD (gastroesophageal reflux disease)   . History of nephrolithiasis   . Hyperlipidemia   . CAD (coronary artery disease)     s/p CABG 1993;  cath 10/10: severe native 3v CAD, S-Dx ok, S-OM1/OM2 ok, S-PDA ok, L-LAD ok, EF 60%  . Pre-syncope   . 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 Marital Status: Married    Spouse Name: N/A    Number of Children: N/A  . Years of Education: N/A   Occupational History  . retired    Social History Main Topics  . Smoking status: Former Smoker    Quit date: 01/16/2006  . Smokeless tobacco: Not on  file  . Alcohol Use: No  . Drug Use: No  . Sexually Active: Not Currently   Other Topics Concern  . Not on file   Social History Narrative  . No narrative on file    Past Surgical History  Procedure Laterality Date  . Coronary artery bypass graft    . Coronary artery bypass graft  1998  . Cholecystectomy    . Prostate surgery    . Back surgery    . Renal stone surgery      3 times  . Throat surgery    . Cardiac catheterization      Family History  Problem Relation Age of Onset  . Coronary artery disease    . Heart disease Father   . Stroke Father     Allergies  Allergen Reactions  . Doxycycline Calcium Other (See Comments)    Causes patient chronic colitis; was hospitalized two weeks due to med.  . Avinza (Morphine Sulfate) Other (See Comments)  . Codeine Sulfate Nausea And Vomiting  . Oxycodone     angry    Current Outpatient Prescriptions on File Prior to Visit  Medication Sig Dispense Refill  . aspirin 81 MG tablet Take 81 mg by mouth daily.        . citalopram (CELEXA) 40 MG tablet TAKE ONE TABLET BY MOUTH DAILY.  90 tablet  3  . clonazePAM (KLONOPIN) 0.5 MG tablet Take 0.25 mg by mouth 2 (two) times daily as  needed.      . meloxicam (MOBIC) 15 MG tablet TAKE ONE TABLET BY MOUTH EVERY DAY AS NEEDED  30 tablet  3  . rosuvastatin (CRESTOR) 20 MG tablet Take by mouth. 1/2 every other day       . vitamin E (VITAMIN E) 400 UNIT capsule Take 400 Units by mouth daily.         No current facility-administered medications on file prior to visit.    BP 110/70  Pulse 72  Temp(Src) 98.2 F (36.8 C)  Resp 16  Ht 5\' 11"  (1.803 m)  Wt 176 lb (79.833 kg)  BMI 24.56 kg/m2        Objective:   Physical Exam  Nursing note and vitals reviewed. Constitutional: He appears well-developed and well-nourished.  HENT:  Head: Normocephalic and atraumatic.  Eyes: Conjunctivae are normal. Pupils are equal, round, and reactive to light.  Neck: Normal range of motion.  Neck supple.  Cardiovascular: Normal rate and regular rhythm.   Murmur heard. Pulmonary/Chest: Effort normal and breath sounds normal.  Abdominal: Soft. Bowel sounds are normal.          Assessment & Plan:  HTN stable Mild gate issues with deconditioning Weight stable  trial of hytrin for prostate

## 2012-03-15 ENCOUNTER — Ambulatory Visit: Payer: Medicare Other | Admitting: Internal Medicine

## 2012-06-25 ENCOUNTER — Other Ambulatory Visit: Payer: Medicare Other

## 2012-06-25 ENCOUNTER — Ambulatory Visit: Payer: Medicare Other | Admitting: Neurosurgery

## 2012-07-09 DIAGNOSIS — H47019 Ischemic optic neuropathy, unspecified eye: Secondary | ICD-10-CM | POA: Diagnosis not present

## 2012-07-09 DIAGNOSIS — H5340 Unspecified visual field defects: Secondary | ICD-10-CM | POA: Diagnosis not present

## 2012-07-09 DIAGNOSIS — H04129 Dry eye syndrome of unspecified lacrimal gland: Secondary | ICD-10-CM | POA: Diagnosis not present

## 2012-07-12 ENCOUNTER — Encounter: Payer: Self-pay | Admitting: Internal Medicine

## 2012-07-12 ENCOUNTER — Ambulatory Visit (INDEPENDENT_AMBULATORY_CARE_PROVIDER_SITE_OTHER): Payer: Medicare Other | Admitting: Internal Medicine

## 2012-07-12 VITALS — BP 124/78 | HR 60 | Temp 97.9°F | Resp 16 | Ht 71.0 in | Wt 174.0 lb

## 2012-07-12 DIAGNOSIS — M542 Cervicalgia: Secondary | ICD-10-CM | POA: Diagnosis not present

## 2012-07-12 MED ORDER — KETOPROFEN-LIDO-GABAPENTIN 5-2-5 % EX CREA: 1.0000 "application " | TOPICAL_CREAM | Freq: Three times a day (TID) | CUTANEOUS | Status: DC

## 2012-07-12 NOTE — Progress Notes (Signed)
Subjective:    Patient ID: [name], male    DOB: 10/24/1931, 79 y.o.   MRN: [id number]  HPI  24/7 neck pain Trial of analgesic creams Will use the ketoprofen. lidocaine and gabapentin cream  Review of Systems  Constitutional: Negative for fever and fatigue.  HENT: Positive for hearing loss, neck pain and neck stiffness. Negative for congestion and postnasal drip.   Eyes: Negative for discharge, redness and visual disturbance.  Respiratory: Negative for cough, shortness of breath and wheezing.   Cardiovascular: Negative for leg swelling.  Gastrointestinal: Negative for abdominal pain, constipation and abdominal distention.  Genitourinary: Negative for urgency and frequency.  Musculoskeletal: Negative for joint swelling and arthralgias.  Skin: Negative for color change and rash.  Neurological: Positive for numbness. Negative for weakness and light-headedness.  Hematological: Negative for adenopathy.  Psychiatric/Behavioral: Negative for behavioral problems.   Past Medical History  Diagnosis Date  . Carotid stenosis     s/p Left CEA, followed by Dr. Arbie Cookey  . Abdominal aortic aneurysm     [place] 11/09 -3.8x3.8cm, 3.8 cm in 2011; followed by Dr. Arbie Cookey  . Radicular low back pain   . BPH (benign prostatic hypertrophy)   . Aphthous ulcer   . Unspecified adverse effect of unspecified drug, medicinal and biological substance   . GERD (gastroesophageal reflux disease)   . History of nephrolithiasis   . Hyperlipidemia   . CAD (coronary artery disease)     s/p CABG 1993;  cath 10/10: severe native 3v CAD, S-Dx ok, S-OM1/OM2 ok, S-PDA ok, L-LAD ok, EF 60%  . Pre-syncope   . 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 Marital Status: Married    Spouse Name: N/A    Number of Children: N/A  . Years of Education: N/A   Occupational History  . retired    Social History Main Topics  . Smoking status: Former Smoker    Quit date: 01/16/2006  .  Smokeless tobacco: Not on file  . Alcohol Use: No  . Drug Use: No  . Sexually Active: Not Currently   Other Topics Concern  . Not on file   Social History Narrative  . No narrative on file    Past Surgical History  Procedure Laterality Date  . Coronary artery bypass graft    . Coronary artery bypass graft  1998  . Cholecystectomy    . Prostate surgery    . Back surgery    . Renal stone surgery      3 times  . Throat surgery    . Cardiac catheterization      Family History  Problem Relation Age of Onset  . Coronary artery disease    . Heart disease Father   . Stroke Father     Allergies  Allergen Reactions  . Doxycycline Calcium Other (See Comments)    Causes patient chronic colitis; was hospitalized two weeks due to med.  . Avinza (Morphine Sulfate) Other (See Comments)  . Codeine Sulfate Nausea And Vomiting  . Oxycodone     angry    Current Outpatient Prescriptions on File Prior to Visit  Medication Sig Dispense Refill  . aspirin 81 MG tablet Take 81 mg by mouth daily.        . citalopram (CELEXA) 40 MG tablet TAKE ONE TABLET BY MOUTH DAILY.  90 tablet  3  . meloxicam (MOBIC) 15 MG tablet TAKE ONE TABLET BY  MOUTH EVERY DAY AS NEEDED  30 tablet  3  . rosuvastatin (CRESTOR) 20 MG tablet Take by mouth. 1/2 every other day       . vitamin E (VITAMIN E) 400 UNIT capsule Take 400 Units by mouth daily.         No current facility-administered medications on file prior to visit.    BP 124/78  Pulse 60  Temp(Src) 97.9 F (36.6 C)  Resp 16  Ht 5\' 11"  (1.803 m)  Wt 174 lb (78.926 kg)  BMI 24.28 kg/m2       Objective:   Physical Exam  Constitutional: He appears well-developed and well-nourished.  HENT:  Head: Normocephalic and atraumatic.  Eyes: Conjunctivae are normal. Pupils are equal, round, and reactive to light.  Neck:  Tense decreased rom  Cardiovascular: Normal rate and regular rhythm.   Pulmonary/Chest: Effort normal and breath sounds normal.   Abdominal: Soft. Bowel sounds are normal.          Assessment & Plan:  TRIAL OF TOPICAL PAIN MEDICATION FROM CHRONIC NECK PAIN

## 2012-07-16 ENCOUNTER — Other Ambulatory Visit (INDEPENDENT_AMBULATORY_CARE_PROVIDER_SITE_OTHER): Payer: Medicare Other | Admitting: *Deleted

## 2012-07-16 DIAGNOSIS — Z48812 Encounter for surgical aftercare following surgery on the circulatory system: Secondary | ICD-10-CM | POA: Diagnosis not present

## 2012-07-16 DIAGNOSIS — I714 Abdominal aortic aneurysm, without rupture: Secondary | ICD-10-CM | POA: Diagnosis not present

## 2012-07-16 DIAGNOSIS — I6529 Occlusion and stenosis of unspecified carotid artery: Secondary | ICD-10-CM | POA: Diagnosis not present

## 2012-07-18 ENCOUNTER — Other Ambulatory Visit: Payer: Self-pay

## 2012-07-18 ENCOUNTER — Other Ambulatory Visit: Payer: Self-pay | Admitting: *Deleted

## 2012-07-18 DIAGNOSIS — Z48812 Encounter for surgical aftercare following surgery on the circulatory system: Secondary | ICD-10-CM

## 2012-07-18 DIAGNOSIS — I714 Abdominal aortic aneurysm, without rupture: Secondary | ICD-10-CM

## 2012-07-22 ENCOUNTER — Encounter: Payer: Self-pay | Admitting: Vascular Surgery

## 2012-08-13 ENCOUNTER — Other Ambulatory Visit: Payer: Self-pay | Admitting: Internal Medicine

## 2012-10-03 ENCOUNTER — Encounter: Payer: Self-pay | Admitting: Cardiovascular Disease

## 2012-10-03 ENCOUNTER — Ambulatory Visit (INDEPENDENT_AMBULATORY_CARE_PROVIDER_SITE_OTHER): Payer: Medicare Other | Admitting: Cardiovascular Disease

## 2012-10-03 VITALS — BP 138/76 | HR 64 | Ht 71.0 in | Wt 176.0 lb

## 2012-10-03 DIAGNOSIS — I2510 Atherosclerotic heart disease of native coronary artery without angina pectoris: Secondary | ICD-10-CM | POA: Diagnosis not present

## 2012-10-03 NOTE — Progress Notes (Signed)
HPI:   79 year old gentleman presenting for followup evaluation. He has coronary artery disease status post CABG in 1993. He had patency of all of his bypass grafts at cardiac catheterization in 2010. He also is followed for carotid disease and a small abdominal aortic aneurysm by Dr. Arbie Cookey.  From a cardiac perspective the patient is doing fine. He denies chest pain or pressure, dyspnea, leg swelling, palpitations, lightheadedness, or syncope.  He's had a great deal of difficulty with chronic neck pain. He had surgery on some cervical discs about 3 years ago. He's had difficulty with pain ever since that time. Otherwise no major complaints.  Outpatient Encounter Prescriptions as of 10/03/2012  Medication Sig Dispense Refill  . aspirin 81 MG tablet Take 81 mg by mouth daily.        . citalopram (CELEXA) 40 MG tablet TAKE ONE TABLET BY MOUTH DAILY  90 tablet  3  . Ketoprofen-Lido-Gabapentin 5-2-5 % CREA Apply 1 application topically 3 (three) times daily. To neck area  240 g  6  . meloxicam (MOBIC) 15 MG tablet TAKE ONE TABLET BY MOUTH EVERY DAY AS NEEDED  30 tablet  3  . rosuvastatin (CRESTOR) 20 MG tablet Take by mouth. 1/2 every other day       . vitamin E (VITAMIN E) 400 UNIT capsule Take 400 Units by mouth daily.         No facility-administered encounter medications on file as of 10/03/2012.    Allergies  Allergen Reactions  . Doxycycline Calcium Other (See Comments)    Causes patient chronic colitis; was hospitalized two weeks due to med.  Sheppard Evens [Morphine Sulfate] Other (See Comments)  . Codeine Sulfate Nausea And Vomiting  . Oxycodone     angry    Past Medical History  Diagnosis Date  . Carotid stenosis     s/p Left CEA, followed by Dr. Arbie Cookey  . Abdominal aortic aneurysm     [place] 11/09 -3.8x3.8cm, 3.8 cm in 2011; followed by Dr. Arbie Cookey  . Radicular low back pain   . BPH (benign prostatic hypertrophy)   . Aphthous ulcer   . Unspecified adverse effect of unspecified drug,  medicinal and biological substance   . GERD (gastroesophageal reflux disease)   . History of nephrolithiasis   . Hyperlipidemia   . CAD (coronary artery disease)     s/p CABG 1993;  cath 10/10: severe native 3v CAD, S-Dx ok, S-OM1/OM2 ok, S-PDA ok, L-LAD ok, EF 60%  . Pre-syncope   . 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    ROS: Positive for chronic neck pain and hard of hearing, otherwise negative except as per HPI  BP 138/76  Pulse 64  Ht 5\' 11"  (1.803 m)  Wt 176 lb (79.833 kg)  BMI 24.56 kg/m2  SpO2 94%  PHYSICAL EXAM: Pt is alert and oriented, pleasant elderly male in NAD HEENT: normal Neck: JVP - normal, carotids 2+= without bruits, carotid endarterectomy scar over the left neck Lungs: CTA bilaterally CV: RRR without murmur or gallop Abd: soft, NT, Positive BS, no hepatomegaly Ext: no C/C/E, distal pulses intact and equal Skin: warm/dry no rash  EKG:  Sinus rhythm 64 beats per minute, rare PVC, otherwise within normal limits.  ASSESSMENT AND PLAN: 1. Coronary artery disease, native vessel. The patient is status post CABG. He has no symptoms of angina. He will continue on his current medical program which includes aspirin and a statin drug.  2. Carotid  stenosis status post left carotid endarterectomy. His endarterectomy site is widely patent. He's followed by vascular surgery.  3. Abdominal aortic aneurysm. This is stable on recent imaging. Also followed by vascular surgery.  4. Hyperlipidemia. Lipids from last year were reviewed. He remains on Crestor.  Tonny Bollman 10/03/2012 11:34 AM

## 2012-10-03 NOTE — Patient Instructions (Signed)
Your physician wants you to follow-up in: 1 YEAR with Dr Cooper.  You will receive a reminder letter in the mail two months in advance. If you don't receive a letter, please call our office to schedule the follow-up appointment.  Your physician recommends that you continue on your current medications as directed. Please refer to the Current Medication list given to you today.  

## 2012-10-21 ENCOUNTER — Ambulatory Visit (INDEPENDENT_AMBULATORY_CARE_PROVIDER_SITE_OTHER): Payer: Medicare Other | Admitting: Internal Medicine

## 2012-10-21 ENCOUNTER — Encounter: Payer: Self-pay | Admitting: Internal Medicine

## 2012-10-21 VITALS — BP 120/74 | HR 72 | Temp 98.6°F | Resp 16 | Ht 71.0 in | Wt 176.0 lb

## 2012-10-21 DIAGNOSIS — M542 Cervicalgia: Secondary | ICD-10-CM | POA: Diagnosis not present

## 2012-10-21 DIAGNOSIS — F329 Major depressive disorder, single episode, unspecified: Secondary | ICD-10-CM

## 2012-10-21 DIAGNOSIS — Z23 Encounter for immunization: Secondary | ICD-10-CM | POA: Diagnosis not present

## 2012-10-21 MED ORDER — ESCITALOPRAM OXALATE 20 MG PO TABS: 20.0000 mg | ORAL_TABLET | Freq: Every day | ORAL | Status: DC

## 2012-10-21 MED ORDER — TIZANIDINE HCL 2 MG PO TABS: 2.0000 mg | ORAL_TABLET | Freq: Three times a day (TID) | ORAL | Status: DC

## 2012-10-21 NOTE — Progress Notes (Signed)
Subjective:    Patient ID: [name], male    DOB: October 11, 1931, 79 y.o.   MRN: [id number]  HPI  The topical cream did not help HTN stable Neck pain is the primary complaint Mood wants to resume the lexapro  Review of Systems  Constitutional: Positive for fatigue. Negative for fever.  HENT: Positive for neck pain and neck stiffness. Negative for hearing loss, congestion and postnasal drip.   Eyes: Negative for discharge, redness and visual disturbance.  Respiratory: Negative for cough, shortness of breath and wheezing.   Cardiovascular: Negative for leg swelling.  Gastrointestinal: Negative for abdominal pain, constipation and abdominal distention.  Genitourinary: Negative for urgency and frequency.  Musculoskeletal: Negative for joint swelling and arthralgias.  Skin: Negative for color change and rash.  Neurological: Positive for weakness. Negative for light-headedness.  Hematological: Negative for adenopathy.  Psychiatric/Behavioral: Positive for sleep disturbance. Negative for behavioral problems.   Past Medical History  Diagnosis Date  . Carotid stenosis     s/p Left CEA, followed by Dr. Arbie Cookey  . Abdominal aortic aneurysm     [place] 11/09 -3.8x3.8cm, 3.8 cm in 2011; followed by Dr. Arbie Cookey  . Radicular low back pain   . BPH (benign prostatic hypertrophy)   . Aphthous ulcer   . Unspecified adverse effect of unspecified drug, medicinal and biological substance   . GERD (gastroesophageal reflux disease)   . History of nephrolithiasis   . Hyperlipidemia   . CAD (coronary artery disease)     s/p CABG 1993;  cath 10/10: severe native 3v CAD, S-Dx ok, S-OM1/OM2 ok, S-PDA ok, L-LAD ok, EF 60%  . Pre-syncope   . 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 Marital Status: Married    Spouse Name: N/A    Number of Children: N/A  . Years of Education: N/A   Occupational History  . retired    Social History Main Topics  . Smoking status:  Former Smoker    Quit date: 01/16/2006  . Smokeless tobacco: Not on file  . Alcohol Use: No  . Drug Use: No  . Sexual Activity: Not Currently   Other Topics Concern  . Not on file   Social History Narrative  . No narrative on file    Past Surgical History  Procedure Laterality Date  . Coronary artery bypass graft    . Coronary artery bypass graft  1998  . Cholecystectomy    . Prostate surgery    . Back surgery    . Renal stone surgery      3 times  . Throat surgery    . Cardiac catheterization      Family History  Problem Relation Age of Onset  . Coronary artery disease    . Heart disease Father   . Stroke Father     Allergies  Allergen Reactions  . Doxycycline Calcium Other (See Comments)    Causes patient chronic colitis; was hospitalized two weeks due to med.  Sheppard Evens [Morphine Sulfate] Other (See Comments)  . Codeine Sulfate Nausea And Vomiting  . Oxycodone     angry    Current Outpatient Prescriptions on File Prior to Visit  Medication Sig Dispense Refill  . aspirin 81 MG tablet Take 81 mg by mouth daily.        . meloxicam (MOBIC) 15 MG tablet TAKE ONE TABLET BY MOUTH EVERY DAY AS NEEDED  30 tablet  3  .  rosuvastatin (CRESTOR) 20 MG tablet Take by mouth. 1/2 every other day       . vitamin E (VITAMIN E) 400 UNIT capsule Take 400 Units by mouth daily.         No current facility-administered medications on file prior to visit.    BP 120/74  Pulse 72  Temp(Src) 98.6 F (37 C)  Resp 16  Ht 5\' 11"  (1.803 m)  Wt 176 lb (79.833 kg)  BMI 24.56 kg/m2       Objective:   Physical Exam  Constitutional: He appears well-developed and well-nourished.  HENT:  Head: Normocephalic and atraumatic.  Neck stiff ness  Eyes: Conjunctivae are normal. Pupils are equal, round, and reactive to light.  Neck: Normal range of motion. Neck supple.  Cardiovascular: Normal rate and regular rhythm.   Pulmonary/Chest: Effort normal and breath sounds normal.   Abdominal: Soft. Bowel sounds are normal.          Assessment & Plan:  Back to Lexapro for better control of depression.  Add Zanaflex 2 mg 3 times a day for neck pain.  Stable hypertension stable peripheral artery disease.

## 2013-03-03 ENCOUNTER — Ambulatory Visit (INDEPENDENT_AMBULATORY_CARE_PROVIDER_SITE_OTHER): Payer: Medicare Other | Admitting: Internal Medicine

## 2013-03-03 ENCOUNTER — Other Ambulatory Visit: Payer: Self-pay | Admitting: Vascular Surgery

## 2013-03-03 ENCOUNTER — Encounter: Payer: Self-pay | Admitting: Internal Medicine

## 2013-03-03 VITALS — BP 124/76 | HR 72 | Temp 98.2°F | Resp 16 | Ht 71.0 in | Wt 177.0 lb

## 2013-03-03 DIAGNOSIS — M542 Cervicalgia: Secondary | ICD-10-CM

## 2013-03-03 DIAGNOSIS — Z48812 Encounter for surgical aftercare following surgery on the circulatory system: Secondary | ICD-10-CM

## 2013-03-03 DIAGNOSIS — I2510 Atherosclerotic heart disease of native coronary artery without angina pectoris: Secondary | ICD-10-CM | POA: Diagnosis not present

## 2013-03-03 DIAGNOSIS — IMO0002 Reserved for concepts with insufficient information to code with codable children: Secondary | ICD-10-CM | POA: Diagnosis not present

## 2013-03-03 DIAGNOSIS — I10 Essential (primary) hypertension: Secondary | ICD-10-CM | POA: Diagnosis not present

## 2013-03-03 DIAGNOSIS — I6529 Occlusion and stenosis of unspecified carotid artery: Secondary | ICD-10-CM

## 2013-03-03 MED ORDER — OXYCODONE-ACETAMINOPHEN 10-325 MG PO TABS: 0.5000 | ORAL_TABLET | Freq: Three times a day (TID) | ORAL | Status: DC | PRN

## 2013-03-03 NOTE — Patient Instructions (Signed)
The patient is instructed to continue all medications as prescribed. Schedule followup with check out clerk upon leaving the clinic  

## 2013-03-03 NOTE — Progress Notes (Signed)
Subjective:    Patient ID: [name], male    DOB: [birth year], 80 y.o.   MRN: [id number]  Coronary Artery Disease Risk factors include hyperlipidemia.  Hyperlipidemia Associated symptoms include myalgias.   Depression on lexapro crestor 20 mg  Neck and Back pain is the primary problem and this has caused him to significantly decrease activity PAD stable Could not tolerate gabapentin due to dizziness      Review of Systems  Constitutional: Positive for activity change, appetite change and fatigue.  HENT: Negative.   Eyes: Negative.   Respiratory: Negative.   Cardiovascular: Negative.   Gastrointestinal: Negative.   Musculoskeletal: Positive for back pain, gait problem, joint swelling, myalgias and neck pain.  Psychiatric/Behavioral: Positive for dysphoric mood and decreased concentration. The patient is nervous/anxious.        Past Medical History  Diagnosis Date  . Carotid stenosis     s/p Left CEA, followed by Dr. Donnetta Hutching  . Abdominal aortic aneurysm     [place] 11/09 -3.8x3.8cm, 3.8 cm in 2011; followed by Dr. Donnetta Hutching  . Radicular low back pain   . BPH (benign prostatic hypertrophy)   . Aphthous ulcer   . Unspecified adverse effect of unspecified drug, medicinal and biological substance   . GERD (gastroesophageal reflux disease)   . History of nephrolithiasis   . Hyperlipidemia   . CAD (coronary artery disease)     s/p CABG 1993;  cath 10/10: severe native 3v CAD, S-Dx ok, S-OM1/OM2 ok, S-PDA ok, L-LAD ok, EF 60%  . Pre-syncope   . 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 Marital Status: Married    Spouse Name: N/A    Number of Children: N/A  . Years of Education: N/A   Occupational History  . retired    Social History Main Topics  . Smoking status: Former Smoker    Quit date: 01/16/2006  . Smokeless tobacco: Not on file  . Alcohol Use: No  . Drug Use: No  . Sexual Activity: Not Currently   Other Topics  Concern  . Not on file   Social History Narrative  . No narrative on file    Past Surgical History  Procedure Laterality Date  . Coronary artery bypass graft    . Coronary artery bypass graft  1998  . Cholecystectomy    . Prostate surgery    . Back surgery    . Renal stone surgery      3 times  . Throat surgery    . Cardiac catheterization      Family History  Problem Relation Age of Onset  . Coronary artery disease    . Heart disease Father   . Stroke Father     Allergies  Allergen Reactions  . Doxycycline Calcium Other (See Comments)    Causes patient chronic colitis; was hospitalized two weeks due to med.  Leatrice Jewels [Morphine Sulfate] Other (See Comments)  . Codeine Sulfate Nausea And Vomiting  . Oxycodone     angry    Current Outpatient Prescriptions on File Prior to Visit  Medication Sig Dispense Refill  . aspirin 81 MG tablet Take 81 mg by mouth daily.        Marland Kitchen escitalopram (LEXAPRO) 20 MG tablet Take 1 tablet (20 mg total) by mouth daily.  90 tablet  3  . meloxicam (MOBIC) 15 MG tablet TAKE ONE TABLET BY MOUTH EVERY DAY AS  NEEDED  30 tablet  3  . rosuvastatin (CRESTOR) 20 MG tablet Take by mouth. 1/2 every other day       . tiZANidine (ZANAFLEX) 2 MG tablet Take 1 tablet (2 mg total) by mouth 3 (three) times daily.  90 tablet  3  . vitamin E (VITAMIN E) 400 UNIT capsule Take 400 Units by mouth daily.         No current facility-administered medications on file prior to visit.    BP 124/76  Pulse 72  Temp(Src) 98.2 F (36.8 C)  Resp 16  Ht 5\' 11"  (1.803 m)  Wt 177 lb (80.287 kg)  BMI 24.70 kg/m2  Past Medical History  Diagnosis Date  . Carotid stenosis     s/p Left CEA, followed by Dr. Donnetta Hutching  . Abdominal aortic aneurysm     [place] 11/09 -3.8x3.8cm, 3.8 cm in 2011; followed by Dr. Donnetta Hutching  . Radicular low back pain   . BPH (benign prostatic hypertrophy)   . Aphthous ulcer   . Unspecified adverse effect of unspecified drug, medicinal and biological  substance   . GERD (gastroesophageal reflux disease)   . History of nephrolithiasis   . Hyperlipidemia   . CAD (coronary artery disease)     s/p CABG 1993;  cath 10/10: severe native 3v CAD, S-Dx ok, S-OM1/OM2 ok, S-PDA ok, L-LAD ok, EF 60%  . Pre-syncope   . 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 Marital Status: Married    Spouse Name: N/A    Number of Children: N/A  . Years of Education: N/A   Occupational History  . retired    Social History Main Topics  . Smoking status: Former Smoker    Quit date: 01/16/2006  . Smokeless tobacco: Not on file  . Alcohol Use: No  . Drug Use: No  . Sexual Activity: Not Currently   Other Topics Concern  . Not on file   Social History Narrative  . No narrative on file    Past Surgical History  Procedure Laterality Date  . Coronary artery bypass graft    . Coronary artery bypass graft  1998  . Cholecystectomy    . Prostate surgery    . Back surgery    . Renal stone surgery      3 times  . Throat surgery    . Cardiac catheterization      Family History  Problem Relation Age of Onset  . Coronary artery disease    . Heart disease Father   . Stroke Father     Allergies  Allergen Reactions  . Doxycycline Calcium Other (See Comments)    Causes patient chronic colitis; was hospitalized two weeks due to med.  Leatrice Jewels [Morphine Sulfate] Other (See Comments)  . Codeine Sulfate Nausea And Vomiting  . Oxycodone     angry    Current Outpatient Prescriptions on File Prior to Visit  Medication Sig Dispense Refill  . aspirin 81 MG tablet Take 81 mg by mouth daily.        Marland Kitchen escitalopram (LEXAPRO) 20 MG tablet Take 1 tablet (20 mg total) by mouth daily.  90 tablet  3  . meloxicam (MOBIC) 15 MG tablet TAKE ONE TABLET BY MOUTH EVERY DAY AS NEEDED  30 tablet  3  . rosuvastatin (CRESTOR) 20 MG tablet Take by mouth. 1/2 every other day       . tiZANidine (  ZANAFLEX) 2 MG tablet Take 1 tablet  (2 mg total) by mouth 3 (three) times daily.  90 tablet  3  . vitamin E (VITAMIN E) 400 UNIT capsule Take 400 Units by mouth daily.         No current facility-administered medications on file prior to visit.    BP 124/76  Pulse 72  Temp(Src) 98.2 F (36.8 C)  Resp 16  Ht 5\' 11"  (1.803 m)  Wt 177 lb (80.287 kg)  BMI 24.70 kg/m2   Severe neck and back pain   Objective:   Physical Exam  Nursing note and vitals reviewed. Constitutional: He appears well-developed and well-nourished.  HENT:  Head: Normocephalic.  Eyes: Conjunctivae are normal.  Neck: Neck supple.  Cardiovascular: Normal rate and regular rhythm.   Murmur heard. Abdominal: Soft. Bowel sounds are normal.  Musculoskeletal: He exhibits edema and tenderness.  Neurological: He displays normal reflexes. No cranial nerve deficit. Coordination abnormal.          Assessment & Plan:  Severe neck and back pain and dise not want any surgery Refused referral Pain interferes with activity  Percocet trial

## 2013-03-03 NOTE — Progress Notes (Signed)
Pre visit review using our clinic review tool, if applicable. No additional management support is needed unless otherwise documented below in the visit note. 

## 2013-03-05 ENCOUNTER — Telehealth: Payer: Self-pay | Admitting: Internal Medicine

## 2013-03-05 NOTE — Telephone Encounter (Signed)
Relevant patient education mailed to patient.  

## 2013-04-04 DIAGNOSIS — M542 Cervicalgia: Secondary | ICD-10-CM | POA: Diagnosis not present

## 2013-04-04 DIAGNOSIS — R030 Elevated blood-pressure reading, without diagnosis of hypertension: Secondary | ICD-10-CM | POA: Diagnosis not present

## 2013-04-04 DIAGNOSIS — Z6829 Body mass index (BMI) 29.0-29.9, adult: Secondary | ICD-10-CM | POA: Diagnosis not present

## 2013-04-08 DIAGNOSIS — M542 Cervicalgia: Secondary | ICD-10-CM | POA: Diagnosis not present

## 2013-04-08 DIAGNOSIS — M5030 Other cervical disc degeneration, unspecified cervical region: Secondary | ICD-10-CM | POA: Diagnosis not present

## 2013-04-08 DIAGNOSIS — M4802 Spinal stenosis, cervical region: Secondary | ICD-10-CM | POA: Diagnosis not present

## 2013-04-24 DIAGNOSIS — H2510 Age-related nuclear cataract, unspecified eye: Secondary | ICD-10-CM | POA: Diagnosis not present

## 2013-04-24 DIAGNOSIS — H538 Other visual disturbances: Secondary | ICD-10-CM | POA: Diagnosis not present

## 2013-04-24 DIAGNOSIS — H47019 Ischemic optic neuropathy, unspecified eye: Secondary | ICD-10-CM | POA: Diagnosis not present

## 2013-04-28 ENCOUNTER — Other Ambulatory Visit: Payer: Self-pay | Admitting: Internal Medicine

## 2013-06-10 DIAGNOSIS — I10 Essential (primary) hypertension: Secondary | ICD-10-CM | POA: Diagnosis not present

## 2013-06-10 DIAGNOSIS — M47812 Spondylosis without myelopathy or radiculopathy, cervical region: Secondary | ICD-10-CM | POA: Diagnosis not present

## 2013-06-10 DIAGNOSIS — M542 Cervicalgia: Secondary | ICD-10-CM | POA: Diagnosis not present

## 2013-06-10 DIAGNOSIS — M4802 Spinal stenosis, cervical region: Secondary | ICD-10-CM | POA: Diagnosis not present

## 2013-06-17 ENCOUNTER — Encounter: Payer: Self-pay | Admitting: *Deleted

## 2013-06-19 ENCOUNTER — Encounter (INDEPENDENT_AMBULATORY_CARE_PROVIDER_SITE_OTHER): Payer: Self-pay

## 2013-06-19 ENCOUNTER — Encounter: Payer: Self-pay | Admitting: Neurology

## 2013-06-19 ENCOUNTER — Ambulatory Visit (INDEPENDENT_AMBULATORY_CARE_PROVIDER_SITE_OTHER): Payer: Medicare Other | Admitting: Neurology

## 2013-06-19 VITALS — BP 129/79 | HR 63 | Ht 71.0 in | Wt 173.0 lb

## 2013-06-19 DIAGNOSIS — M47812 Spondylosis without myelopathy or radiculopathy, cervical region: Secondary | ICD-10-CM | POA: Insufficient documentation

## 2013-06-19 DIAGNOSIS — D518 Other vitamin B12 deficiency anemias: Secondary | ICD-10-CM | POA: Diagnosis not present

## 2013-06-19 DIAGNOSIS — R413 Other amnesia: Secondary | ICD-10-CM | POA: Diagnosis not present

## 2013-06-19 DIAGNOSIS — R6889 Other general symptoms and signs: Secondary | ICD-10-CM

## 2013-06-19 DIAGNOSIS — I2510 Atherosclerotic heart disease of native coronary artery without angina pectoris: Secondary | ICD-10-CM | POA: Diagnosis not present

## 2013-06-19 HISTORY — DX: Other amnesia: R41.3

## 2013-06-19 MED ORDER — GABAPENTIN 100 MG PO CAPS: ORAL_CAPSULE | ORAL | Status: DC

## 2013-06-19 NOTE — Patient Instructions (Signed)
Radicular Pain Radicular pain in either the arm or leg is usually from a bulging or herniated disk in the spine. A piece of the herniated disk may press against the nerves as the nerves exit the spine. This causes pain which is felt at the tips of the nerves down the arm or leg. Other causes of radicular pain may include:  Fractures.  Heart disease.  Cancer.  An abnormal and usually degenerative state of the nervous system or nerves (neuropathy). Diagnosis may require CT or MRI scanning to determine the primary cause.  Nerves that start at the neck (nerve roots) may cause radicular pain in the outer shoulder and arm. It can spread down to the thumb and fingers. The symptoms vary depending on which nerve root has been affected. In most cases radicular pain improves with conservative treatment. Neck problems may require physical therapy, a neck collar, or cervical traction. Treatment may take many weeks, and surgery may be considered if the symptoms do not improve.  Conservative treatment is also recommended for sciatica. Sciatica causes pain to radiate from the lower back or buttock area down the leg into the foot. Often there is a history of back problems. Most patients with sciatica are better after 2 to 4 weeks of rest and other supportive care. Short term bed rest can reduce the disk pressure considerably. Sitting, however, is not a good position since this increases the pressure on the disk. You should avoid bending, lifting, and all other activities which make the problem worse. Traction can be used in severe cases. Surgery is usually reserved for patients who do not improve within the first months of treatment. Only take over-the-counter or prescription medicines for pain, discomfort, or fever as directed by your caregiver. Narcotics and muscle relaxants may help by relieving more severe pain and spasm and by providing mild sedation. Cold or massage can give significant relief. Spinal manipulation  is not recommended. It can increase the degree of disc protrusion. Epidural steroid injections are often effective treatment for radicular pain. These injections deliver medicine to the spinal nerve in the space between the protective covering of the spinal cord and back bones (vertebrae). Your caregiver can give you more information about steroid injections. These injections are most effective when given within two weeks of the onset of pain.  You should see your caregiver for follow up care as recommended. A program for neck and back injury rehabilitation with stretching and strengthening exercises is an important part of management.  SEEK IMMEDIATE MEDICAL CARE IF:  You develop increased pain, weakness, or numbness in your arm or leg.  You develop difficulty with bladder or bowel control.  You develop abdominal pain. Document Released: 02/10/2004 Document Revised: 03/27/2011 Document Reviewed: 04/27/2008 [employer] Patient Information 2014 [employer].  
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
## 2013-06-20 LAB — VITAMIN B12: Vitamin B-12: 628 pg/mL (ref 211–946)

## 2013-06-20 LAB — RPR: RPR: NONREACTIVE

## 2013-06-20 LAB — TSH: TSH: 4.77 u[IU]/mL — ABNORMAL HIGH (ref 0.450–4.500)

## 2013-06-20 NOTE — Progress Notes (Signed)
Quick Note:  I called pt and relayed the results to wife as per Dr. Tobey Grim comment. Will forward to Dr. Arnoldo Morale, pcp. Wife verbalized understanding. ______

## 2013-06-27 ENCOUNTER — Ambulatory Visit
Admission: RE | Admit: 2013-06-27 | Discharge: 2013-06-27 | Disposition: A | Discharge status: Home / Self Care | Payer: Medicare Other | Source: Ambulatory Visit | Attending: Neurology | Admitting: Neurology

## 2013-06-27 DIAGNOSIS — R413 Other amnesia: Secondary | ICD-10-CM

## 2013-06-30 ENCOUNTER — Telehealth: Payer: Self-pay | Admitting: Neurology

## 2013-06-30 NOTE — Telephone Encounter (Signed)
  I called patient, talked with the wife. MRI the brain shows minimal small vessel changes. The patient may be candidate for Aricept, if he wishes to go on the medication, they are to call me back.    MRI brain 06/30/2014:  Impression   Mildly abnormal MRI scan of the brain showing age-appropriate  changes of chronic microvascular ischemia and generalized cerebral  atrophy. Stable appearance of small right frontal calvarial cystic lesion.

## 2013-07-21 ENCOUNTER — Encounter: Payer: Self-pay | Admitting: Vascular Surgery

## 2013-07-22 ENCOUNTER — Ambulatory Visit (INDEPENDENT_AMBULATORY_CARE_PROVIDER_SITE_OTHER): Payer: Medicare Other | Admitting: Vascular Surgery

## 2013-07-22 ENCOUNTER — Ambulatory Visit (HOSPITAL_COMMUNITY)
Admission: RE | Admit: 2013-07-22 | Discharge: 2013-07-22 | Disposition: A | Discharge status: Home / Self Care | Payer: Medicare Other | Source: Ambulatory Visit | Attending: Vascular Surgery | Admitting: Vascular Surgery

## 2013-07-22 ENCOUNTER — Encounter: Payer: Self-pay | Admitting: Vascular Surgery

## 2013-07-22 ENCOUNTER — Ambulatory Visit (INDEPENDENT_AMBULATORY_CARE_PROVIDER_SITE_OTHER)
Admission: RE | Admit: 2013-07-22 | Discharge: 2013-07-22 | Disposition: A | Discharge status: Home / Self Care | Payer: Medicare Other | Source: Ambulatory Visit | Attending: Vascular Surgery | Admitting: Vascular Surgery

## 2013-07-22 VITALS — BP 161/100 | HR 64 | Resp 16 | Ht 71.0 in | Wt 174.0 lb

## 2013-07-22 DIAGNOSIS — I714 Abdominal aortic aneurysm, without rupture, unspecified: Secondary | ICD-10-CM | POA: Insufficient documentation

## 2013-07-22 DIAGNOSIS — I6529 Occlusion and stenosis of unspecified carotid artery: Secondary | ICD-10-CM

## 2013-07-22 DIAGNOSIS — Z48812 Encounter for surgical aftercare following surgery on the circulatory system: Secondary | ICD-10-CM
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
## 2013-07-25 ENCOUNTER — Other Ambulatory Visit: Payer: Self-pay

## 2013-07-25 DIAGNOSIS — I714 Abdominal aortic aneurysm, without rupture, unspecified: Secondary | ICD-10-CM

## 2013-07-25 DIAGNOSIS — Z48812 Encounter for surgical aftercare following surgery on the circulatory system: Secondary | ICD-10-CM

## 2013-07-25 DIAGNOSIS — I6529 Occlusion and stenosis of unspecified carotid artery: Secondary | ICD-10-CM

## 2013-09-01 ENCOUNTER — Ambulatory Visit: Payer: Medicare Other | Admitting: Internal Medicine

## 2013-09-12 ENCOUNTER — Encounter: Payer: Self-pay | Admitting: Family Medicine

## 2013-09-12 ENCOUNTER — Ambulatory Visit (INDEPENDENT_AMBULATORY_CARE_PROVIDER_SITE_OTHER): Payer: Medicare Other | Admitting: Family Medicine

## 2013-09-12 VITALS — BP 141/79 | HR 66 | Temp 97.7°F | Wt 174.0 lb

## 2013-09-12 DIAGNOSIS — R1031 Right lower quadrant pain: Secondary | ICD-10-CM

## 2013-09-12 DIAGNOSIS — R109 Unspecified abdominal pain: Secondary | ICD-10-CM

## 2013-09-12 NOTE — Patient Instructions (Signed)
We are going to refer you back to surgery. I do not feel an obvious hernia but given your history of groin surgery for hernia, feel that surgeries input is important. Possible could have some nerve entrapment if you had mesh at time of surgery. We are going to try to get you in next week.   I would try a short course of ice (3-4 minutes) if this occurs over the weekend. You can also try tylenol.
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
## 2013-09-16 ENCOUNTER — Ambulatory Visit (INDEPENDENT_AMBULATORY_CARE_PROVIDER_SITE_OTHER): Payer: PRIVATE HEALTH INSURANCE | Admitting: General Surgery

## 2013-09-16 DIAGNOSIS — K4090 Unilateral inguinal hernia, without obstruction or gangrene, not specified as recurrent: Secondary | ICD-10-CM | POA: Diagnosis not present

## 2013-09-26 ENCOUNTER — Ambulatory Visit: Payer: Medicare Other | Admitting: Cardiovascular Disease

## 2013-09-26 ENCOUNTER — Inpatient Hospital Stay (HOSPITAL_COMMUNITY)
Admission: EM | Admit: 2013-09-26 | Discharge: 2013-09-30 | DRG: 351 | Disposition: A | Discharge status: Home / Self Care | Payer: Medicare Other | Attending: General Surgery | Admitting: General Surgery

## 2013-09-26 ENCOUNTER — Emergency Department (HOSPITAL_COMMUNITY): Payer: Medicare Other

## 2013-09-26 ENCOUNTER — Encounter (HOSPITAL_COMMUNITY): Payer: Self-pay | Admitting: Emergency Medicine

## 2013-09-26 DIAGNOSIS — Z85819 Personal history of malignant neoplasm of unspecified site of lip, oral cavity, and pharynx: Secondary | ICD-10-CM

## 2013-09-26 DIAGNOSIS — Z951 Presence of aortocoronary bypass graft: Secondary | ICD-10-CM | POA: Diagnosis not present

## 2013-09-26 DIAGNOSIS — Z01810 Encounter for preprocedural cardiovascular examination: Secondary | ICD-10-CM

## 2013-09-26 DIAGNOSIS — E785 Hyperlipidemia, unspecified: Secondary | ICD-10-CM | POA: Diagnosis present

## 2013-09-26 DIAGNOSIS — I714 Abdominal aortic aneurysm, without rupture, unspecified: Secondary | ICD-10-CM | POA: Diagnosis present

## 2013-09-26 DIAGNOSIS — I10 Essential (primary) hypertension: Secondary | ICD-10-CM | POA: Diagnosis present

## 2013-09-26 DIAGNOSIS — K7689 Other specified diseases of liver: Secondary | ICD-10-CM | POA: Diagnosis present

## 2013-09-26 DIAGNOSIS — I25810 Atherosclerosis of coronary artery bypass graft(s) without angina pectoris: Secondary | ICD-10-CM

## 2013-09-26 DIAGNOSIS — N183 Chronic kidney disease, stage 3 unspecified: Secondary | ICD-10-CM | POA: Diagnosis present

## 2013-09-26 DIAGNOSIS — R109 Unspecified abdominal pain: Secondary | ICD-10-CM | POA: Diagnosis not present

## 2013-09-26 DIAGNOSIS — Z7982 Long term (current) use of aspirin: Secondary | ICD-10-CM | POA: Diagnosis not present

## 2013-09-26 DIAGNOSIS — K4090 Unilateral inguinal hernia, without obstruction or gangrene, not specified as recurrent: Secondary | ICD-10-CM | POA: Diagnosis not present

## 2013-09-26 DIAGNOSIS — I369 Nonrheumatic tricuspid valve disorder, unspecified: Secondary | ICD-10-CM | POA: Diagnosis not present

## 2013-09-26 DIAGNOSIS — N179 Acute kidney failure, unspecified: Secondary | ICD-10-CM | POA: Diagnosis present

## 2013-09-26 DIAGNOSIS — Z87891 Personal history of nicotine dependence: Secondary | ICD-10-CM | POA: Diagnosis not present

## 2013-09-26 DIAGNOSIS — I2510 Atherosclerotic heart disease of native coronary artery without angina pectoris: Secondary | ICD-10-CM | POA: Diagnosis present

## 2013-09-26 DIAGNOSIS — H9190 Unspecified hearing loss, unspecified ear: Secondary | ICD-10-CM | POA: Diagnosis present

## 2013-09-26 DIAGNOSIS — Z79899 Other long term (current) drug therapy: Secondary | ICD-10-CM | POA: Diagnosis not present

## 2013-09-26 DIAGNOSIS — I739 Peripheral vascular disease, unspecified: Secondary | ICD-10-CM | POA: Diagnosis not present

## 2013-09-26 DIAGNOSIS — N400 Enlarged prostate without lower urinary tract symptoms: Secondary | ICD-10-CM | POA: Diagnosis present

## 2013-09-26 DIAGNOSIS — I498 Other specified cardiac arrhythmias: Secondary | ICD-10-CM | POA: Diagnosis present

## 2013-09-26 DIAGNOSIS — K219 Gastro-esophageal reflux disease without esophagitis: Secondary | ICD-10-CM | POA: Diagnosis not present

## 2013-09-26 LAB — CBC WITH DIFFERENTIAL/PLATELET
Basophils Absolute: 0 10*3/uL (ref 0.0–0.1)
Basophils Relative: 0 % (ref 0–1)
Eosinophils Absolute: 0.2 10*3/uL (ref 0.0–0.7)
Eosinophils Relative: 3 % (ref 0–5)
HEMATOCRIT: 45.6 % (ref 39.0–52.0)
HEMOGLOBIN: 14.8 g/dL (ref 13.0–17.0)
LYMPHS ABS: 1.3 10*3/uL (ref 0.7–4.0)
Lymphocytes Relative: 24 % (ref 12–46)
MCH: 31.1 pg (ref 26.0–34.0)
MCHC: 32.5 g/dL (ref 30.0–36.0)
MCV: 95.8 fL (ref 78.0–100.0)
MONOS PCT: 11 % (ref 3–12)
Monocytes Absolute: 0.6 10*3/uL (ref 0.1–1.0)
NEUTROS ABS: 3.3 10*3/uL (ref 1.7–7.7)
Neutrophils Relative %: 62 % (ref 43–77)
Platelets: 167 10*3/uL (ref 150–400)
RBC: 4.76 MIL/uL (ref 4.22–5.81)
RDW: 14.1 % (ref 11.5–15.5)
WBC: 5.4 10*3/uL (ref 4.0–10.5)

## 2013-09-26 LAB — COMPREHENSIVE METABOLIC PANEL
ALBUMIN: 3.7 g/dL (ref 3.5–5.2)
ALK PHOS: 77 U/L (ref 39–117)
ALT: 16 U/L (ref 0–53)
ANION GAP: 11 (ref 5–15)
AST: 25 U/L (ref 0–37)
BUN: 17 mg/dL (ref 6–23)
CHLORIDE: 107 meq/L (ref 96–112)
CO2: 26 meq/L (ref 19–32)
CREATININE: 1.33 mg/dL (ref 0.50–1.35)
Calcium: 8.6 mg/dL (ref 8.4–10.5)
GFR, EST AFRICAN AMERICAN: 57 mL/min — AB (ref >90)
GFR, EST NON AFRICAN AMERICAN: 49 mL/min — AB (ref >90)
GLUCOSE: 86 mg/dL (ref 70–99)
Potassium: 4.2 mEq/L (ref 3.7–5.3)
Sodium: 144 mEq/L (ref 137–147)
Total Bilirubin: 0.8 mg/dL (ref 0.3–1.2)
Total Protein: 6.5 g/dL (ref 6.0–8.3)

## 2013-09-26 LAB — I-STAT CHEM 8, ED
BUN: 19 mg/dL (ref 6–23)
Calcium, Ion: 1.12 mmol/L — ABNORMAL LOW (ref 1.13–1.30)
Chloride: 106 mEq/L (ref 96–112)
Creatinine, Ser: 1.4 mg/dL — ABNORMAL HIGH (ref 0.50–1.35)
Glucose, Bld: 88 mg/dL (ref 70–99)
HEMATOCRIT: 46 % (ref 39.0–52.0)
HEMOGLOBIN: 15.6 g/dL (ref 13.0–17.0)
Potassium: 4 mEq/L (ref 3.7–5.3)
Sodium: 143 mEq/L (ref 137–147)
TCO2: 27 mmol/L (ref 0–100)

## 2013-09-26 LAB — I-STAT CG4 LACTIC ACID, ED: LACTIC ACID, VENOUS: 0.41 mmol/L — AB (ref 0.5–2.2)

## 2013-09-26 MED ORDER — ESCITALOPRAM OXALATE 20 MG PO TABS
20.0000 mg | ORAL_TABLET | Freq: Every day | ORAL | Status: DC
  Administered 2013-09-27 – 2013-09-30 (×4): 20 mg via ORAL
  Filled 2013-09-26 (×2): qty 2
  Filled 2013-09-26: qty 1
  Filled 2013-09-26 (×2): qty 2

## 2013-09-26 MED ORDER — FENTANYL CITRATE 0.05 MG/ML IJ SOLN
50.0000 ug | Freq: Once | INTRAMUSCULAR | Status: AC
  Administered 2013-09-26: 50 ug via NASAL
  Filled 2013-09-26: qty 2

## 2013-09-26 MED ORDER — ONDANSETRON HCL 4 MG/2ML IJ SOLN: 4.0000 mg | Freq: Four times a day (QID) | INTRAMUSCULAR | Status: DC | PRN

## 2013-09-26 MED ORDER — FENTANYL CITRATE 0.05 MG/ML IJ SOLN: 25.0000 ug | INTRAMUSCULAR | Status: DC | PRN

## 2013-09-26 MED ORDER — ENOXAPARIN SODIUM 40 MG/0.4ML ~~LOC~~ SOLN
40.0000 mg | SUBCUTANEOUS | Status: DC
  Administered 2013-09-26 – 2013-09-29 (×4): 40 mg via SUBCUTANEOUS
  Filled 2013-09-26 (×7): qty 0.4

## 2013-09-26 MED ORDER — GABAPENTIN 100 MG PO CAPS
200.0000 mg | ORAL_CAPSULE | Freq: Two times a day (BID) | ORAL | Status: DC
  Administered 2013-09-26 – 2013-09-30 (×8): 200 mg via ORAL
  Filled 2013-09-26 (×10): qty 2

## 2013-09-26 MED ORDER — FENTANYL CITRATE 0.05 MG/ML IJ SOLN
50.0000 ug | Freq: Once | INTRAMUSCULAR | Status: AC
  Administered 2013-09-26: 50 ug via INTRAVENOUS
  Filled 2013-09-26: qty 2

## 2013-09-26 MED ORDER — HYDRALAZINE HCL 20 MG/ML IJ SOLN: 10.0000 mg | Freq: Four times a day (QID) | INTRAMUSCULAR | Status: DC | PRN

## 2013-09-26 MED ORDER — ACETAMINOPHEN 325 MG PO TABS
650.0000 mg | ORAL_TABLET | Freq: Four times a day (QID) | ORAL | Status: DC | PRN
  Administered 2013-09-27 – 2013-09-30 (×5): 650 mg via ORAL
  Filled 2013-09-26 (×5): qty 2

## 2013-09-26 MED ORDER — IOHEXOL 300 MG/ML  SOLN
100.0000 mL | Freq: Once | INTRAMUSCULAR | Status: AC | PRN
  Administered 2013-09-26: 100 mL via INTRAVENOUS

## 2013-09-26 MED ORDER — IOHEXOL 300 MG/ML  SOLN
25.0000 mL | Freq: Once | INTRAMUSCULAR | Status: AC | PRN
  Administered 2013-09-26: 25 mL via ORAL

## 2013-09-26 MED ORDER — SODIUM CHLORIDE 0.9 % IV SOLN: INTRAVENOUS | Status: DC

## 2013-09-26 MED ORDER — ACETAMINOPHEN 650 MG RE SUPP: 650.0000 mg | Freq: Four times a day (QID) | RECTAL | Status: DC | PRN

## 2013-09-26 NOTE — ED Provider Notes (Signed)
CSN: [id number]     Arrival date & time 09/26/13  1151 History   First MD Initiated Contact with Patient 09/26/13 1500     Chief Complaint  Patient presents with  . Groin Pain     (Consider location/radiation/quality/duration/timing/severity/associated sxs/prior Treatment) The history is provided by the patient.  [name] is a 80 y.o. male hx of AAA, possible previous hernia repair, CABG here with R groin pain. R groin pain for the last month, worsening over last week. Saw surgery a week ago and was thought to be secondary to inguinal hernia. He was sent to cardiology for preop clearance. Over the last week, worsening pain in the right groin area. Denies testicular pain or vomiting. Has hx of AAA but was stable as of 2 months ago with [place].    Past Medical History  Diagnosis Date  . Carotid stenosis     s/p Left CEA, followed by Dr. Donnetta Hutching  . Abdominal aortic aneurysm     [place] 11/09 -3.8x3.8cm, 3.8 cm in 2011; followed by Dr. Donnetta Hutching  . Radicular low back pain   . BPH (benign prostatic hypertrophy)   . Aphthous ulcer   . Unspecified adverse effect of unspecified drug, medicinal and biological substance   . GERD (gastroesophageal reflux disease)   . History of nephrolithiasis   . Hyperlipidemia   . CAD (coronary artery disease)     s/p CABG 1993;  cath 10/10: severe native 3v CAD, S-Dx ok, S-OM1/OM2 ok, S-PDA ok, L-LAD ok, EF 60%  . Pre-syncope   . 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 HOH (hard of hearing)     hearing aids  . Memory deficits 06/19/2013  . Throat cancer   . Cervical spondylosis without myelopathy    Past Surgical History  Procedure Laterality Date  . Coronary artery bypass graft    . Coronary artery bypass graft  1998  . Cholecystectomy    . Prostate surgery    . Back surgery    . Renal stone surgery      3 times  . Throat surgery    . Cardiac catheterization    . Carotid endarterectomy Left   . Hernia repair      unclear which side    Family History  Problem Relation Age of Onset  . Coronary artery disease    . Heart disease Father   . Stroke Father   . Stroke Brother   . Coronary artery disease Brother   . Heart disease Brother    History  Substance Use Topics  . Smoking status: Former Smoker    Quit date: 01/16/2006  . Smokeless tobacco: Never Used  . Alcohol Use: No    Review of Systems  Gastrointestinal: Positive for abdominal pain.  All other systems reviewed and are negative.     Allergies  Avinza; Doxycycline calcium; Vibramycin; Statins; Codeine sulfate; and Oxycodone  Home Medications   Prior to Admission medications   Medication Sig Start Date End Date Taking? Authorizing Provider  ALPHAGAN P 0.1 % SOLN Place 1 drop into both eyes daily. 04/24/13  Yes Historical Provider, MD  aspirin EC 81 MG tablet Take 81 mg by mouth 3 (three) times a week.   Yes Historical Provider, MD  B Complex Vitamins (VITAMIN B COMPLEX PO) Take 50 mg by mouth every other day.   Yes Historical Provider, MD  escitalopram (LEXAPRO) 20 MG tablet Take 1 tablet (20 mg total) by mouth  daily. 10/21/12  Yes Ricard Dillon, MD  gabapentin (NEURONTIN) 100 MG capsule Take 200 mg by mouth 2 (two) times daily.   Yes Historical Provider, MD  rosuvastatin (CRESTOR) 20 MG tablet Take 10 mg by mouth every other day.   Yes Historical Provider, MD  vitamin E (VITAMIN E) 400 UNIT capsule Take 400 Units by mouth every other day.    Yes Historical Provider, MD   BP 182/131  Pulse 52  Temp(Src) 98.6 F (37 C) (Oral)  Resp 20  SpO2 95% Physical Exam  Nursing note and vitals reviewed. Constitutional: He is oriented to person, place, and time.  Slightly uncomfortable   HENT:  Head: Normocephalic.  Mouth/Throat: Oropharynx is clear and moist.  Eyes: EOM are normal. Pupils are equal, round, and reactive to light.  Neck: Normal range of motion. Neck supple.  Cardiovascular: Normal rate, regular rhythm and normal heart sounds.    Pulmonary/Chest: Effort normal and breath sounds normal. No respiratory distress. He has no wheezes. He has no rales.  Abdominal: Soft. Bowel sounds are normal. He exhibits no distension. There is no tenderness. There is no rebound and no guarding.  Genitourinary:  R inguinal hernia, reducible but comes out. Doesn't appear incarcerated. Testicles nontender   Musculoskeletal: Normal range of motion. He exhibits no edema and no tenderness.  Neurological: He is alert and oriented to person, place, and time. No cranial nerve deficit. Coordination normal.  Skin: Skin is warm and dry.  Psychiatric: He has a normal mood and affect. His behavior is normal. Judgment and thought content normal.    ED Course  Procedures (including critical care time) Labs Review Labs Reviewed  COMPREHENSIVE METABOLIC PANEL - Abnormal; Notable for the following:    GFR calc non Af Amer 49 (*)    GFR calc Af Amer 57 (*)    All other components within normal limits  I-STAT CG4 LACTIC ACID, ED - Abnormal; Notable for the following:    Lactic Acid, Venous 0.41 (*)    All other components within normal limits  I-STAT CHEM 8, ED - Abnormal; Notable for the following:    Creatinine, Ser 1.40 (*)    Calcium, Ion 1.12 (*)    All other components within normal limits  CBC WITH DIFFERENTIAL  BASIC METABOLIC PANEL    Imaging Review No results found.   EKG Interpretation None      MDM   Final diagnoses:  None    [name] is a 80 y.o. male here with R groin swelling. Likely inguinal hernia. Family concerned that he has been having a lot of pain. Reducible but comes out right away. I doubt incarcerated hernia. Will consult surgery.   5:08 PM Surgery will admit.    Wandra Arthurs, MD 09/26/13 [phone]

## 2013-09-26 NOTE — ED Notes (Signed)
Pt had gone to the surgeon on Tuesday Dr. Marlou Starks, for pain to the right lower groin area. Was told it was a hernia and the pain has been increasing which is a burning pain. Pt is here for the increase in pain.

## 2013-09-26 NOTE — H&P (Signed)
Chief Complaint:  Right inguinal hernia  HPI: [name] is a 80 year old male with a history of CAD s/p CABG, carotid stenosis s/p CEA, BPH, HLD and 3.5cm AAA who presents with right groin pain.  Duration of symptoms is 2 months.  Onset was gradual.  Coarse is worsening.  Moderate in severity.  Time pattern is intermittent.  No aggravating factors.  No alleviating or modifying factors.  He was seen by Dr. Marlou Starks approximately 2 weeks ago for the inguinal hernia and was supposed to be seen by cardiology today, however, he did not go due to worsening pain.  He denies nausea or vomiting.  He is passing flatus and having BMs.  He denies fever or chills. He denies any chest pains or shortness of breath.  He is able to walk a flight of stairs without shortness of breath.  He is followed by Dr. Burt Knack.    Past Medical History  Diagnosis Date  . Carotid stenosis     s/p Left CEA, followed by Dr. Donnetta Hutching  . Abdominal aortic aneurysm     [place] 11/09 -3.8x3.8cm, 3.8 cm in 2011; followed by Dr. Donnetta Hutching  . Radicular low back pain   . BPH (benign prostatic hypertrophy)   . Aphthous ulcer   . Unspecified adverse effect of unspecified drug, medicinal and biological substance   . GERD (gastroesophageal reflux disease)   . History of nephrolithiasis   . Hyperlipidemia   . CAD (coronary artery disease)     s/p CABG 1993;  cath 10/10: severe native 3v CAD, S-Dx ok, S-OM1/OM2 ok, S-PDA ok, L-LAD ok, EF 60%  . Pre-syncope   . 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 HOH (hard of hearing)     hearing aids  . Memory deficits 06/19/2013  . Throat cancer   . Cervical spondylosis without myelopathy     Past Surgical History  Procedure Laterality Date  . Coronary artery bypass graft    . Coronary artery bypass graft  1998  . Cholecystectomy    . Prostate surgery    . Back surgery    . Renal stone surgery      3 times  . Throat surgery    . Cardiac catheterization    . Carotid endarterectomy Left    . Hernia repair      unclear which side    Family History  Problem Relation Age of Onset  . Coronary artery disease    . Heart disease Father   . Stroke Father   . Stroke Brother   . Coronary artery disease Brother   . Heart disease Brother    Social History:  reports that he quit smoking about 7 years ago. He has never used smokeless tobacco. He reports that he does not drink alcohol or use illicit drugs.  Allergies:  Allergies  Allergen Reactions  . Avinza [Morphine Sulfate] Other (See Comments)    Altered mental status. "about killed him"  . Doxycycline Calcium Other (See Comments)    Causes patient chronic colitis; was hospitalized two weeks due to med.  . Vibramycin [Doxycycline Hyclate] Other (See Comments)    Causes patient colitis; was hospitalized two weeks due to med  . Statins Other (See Comments)    Muscle pain; can tolerate crestor 10 mg every other day  . Codeine Sulfate Nausea And Vomiting  . Oxycodone Other (See Comments)    Angry; altered mental status; "wild and crazy"   Medication:  ASA 31m PO daily lexapro 248mPO daily Gabapentin 10021mO BID crestor 30m75mS  (Not in a hospital admission)  Results for orders placed during the hospital encounter of 09/26/13 (from the past 48 hour(s))  CBC WITH DIFFERENTIAL     Status: None   Collection Time    09/26/13  3:33 PM      Result Value Ref Range   WBC 5.4  4.0 - 10.5 K/uL   RBC 4.76  4.22 - 5.81 MIL/uL   Hemoglobin 14.8  13.0 - 17.0 g/dL   HCT 45.6  39.0 - 52.0 %   MCV 95.8  78.0 - 100.0 fL   MCH 31.1  26.0 - 34.0 pg   MCHC 32.5  30.0 - 36.0 g/dL   RDW 14.1  11.5 - 15.5 %   Platelets 167  150 - 400 K/uL   Neutrophils Relative % 62  43 - 77 %   Neutro Abs 3.3  1.7 - 7.7 K/uL   Lymphocytes Relative 24  12 - 46 %   Lymphs Abs 1.3  0.7 - 4.0 K/uL   Monocytes Relative 11  3 - 12 %   Monocytes Absolute 0.6  0.1 - 1.0 K/uL   Eosinophils Relative 3  0 - 5 %   Eosinophils Absolute 0.2  0.0 - 0.7 K/uL    Basophils Relative 0  0 - 1 %   Basophils Absolute 0.0  0.0 - 0.1 K/uL  COMPREHENSIVE METABOLIC PANEL     Status: Abnormal   Collection Time    09/26/13  3:33 PM      Result Value Ref Range   Sodium 144  137 - 147 mEq/L   Potassium 4.2  3.7 - 5.3 mEq/L   Chloride 107  96 - 112 mEq/L   CO2 26  19 - 32 mEq/L   Glucose, Bld 86  70 - 99 mg/dL   BUN 17  6 - 23 mg/dL   Creatinine, Ser 1.33  0.50 - 1.35 mg/dL   Calcium 8.6  8.4 - 10.5 mg/dL   Total Protein 6.5  6.0 - 8.3 g/dL   Albumin 3.7  3.5 - 5.2 g/dL   AST 25  0 - 37 U/L   ALT 16  0 - 53 U/L   Alkaline Phosphatase 77  39 - 117 U/L   Total Bilirubin 0.8  0.3 - 1.2 mg/dL   GFR calc non Af Amer 49 (*) >90 mL/min   GFR calc Af Amer 57 (*) >90 mL/min   Comment: (NOTE)     The eGFR has been calculated using the CKD EPI equation.     This calculation has not been validated in all clinical situations.     eGFR's persistently <90 mL/min signify possible Chronic Kidney     Disease.   Anion gap 11  5 - 15  I-STAT CG4 LACTIC ACID, ED     Status: Abnormal   Collection Time    09/26/13  3:35 PM      Result Value Ref Range   Lactic Acid, Venous 0.41 (*) 0.5 - 2.2 mmol/L  I-STAT CHEM 8, ED     Status: Abnormal   Collection Time    09/26/13  3:41 PM      Result Value Ref Range   Sodium 143  137 - 147 mEq/L   Potassium 4.0  3.7 - 5.3 mEq/L   Chloride 106  96 - 112 mEq/L   BUN 19  6 - 23 mg/dL  Creatinine, Ser 1.40 (*) 0.50 - 1.35 mg/dL   Glucose, Bld 88  70 - 99 mg/dL   Calcium, Ion 1.12 (*) 1.13 - 1.30 mmol/L   TCO2 27  0 - 100 mmol/L   Hemoglobin 15.6  13.0 - 17.0 g/dL   HCT 46.0  39.0 - 52.0 %   No results found.  Review of Systems  All other systems reviewed and are negative.   Blood pressure 182/131, pulse 52, temperature 98.6 F (37 C), temperature source Oral, resp. rate 20, SpO2 95.00%. Physical Exam  Constitutional: He is oriented to person, place, and time. He appears well-developed and well-nourished. No distress.   Very hard of hearing  HENT:  Head: Normocephalic and atraumatic.  Neck: Normal range of motion. Neck supple.  Cardiovascular: Normal rate, regular rhythm, normal heart sounds and intact distal pulses.  Exam reveals no gallop and no friction rub.   No murmur heard. Respiratory: Effort normal and breath sounds normal. No respiratory distress. He has no wheezes. He has no rales. He exhibits no tenderness.  GI: Soft. Bowel sounds are normal. He exhibits no distension and no mass. There is no tenderness. There is no rebound and no guarding.  Easily reducible right inguinal hernia, no erythema  Musculoskeletal: Normal range of motion. He exhibits no edema and no tenderness.  Lymphadenopathy:    He has no cervical adenopathy.  Neurological: He is alert and oriented to person, place, and time.  Skin: Skin is warm and dry. No rash noted. He is not diaphoretic. No erythema. No pallor.  Psychiatric: He has a normal mood and affect. His behavior is normal. Judgment and thought content normal.     Assessment/Plan Right inguinal hernia -does not need urgent surgery.  Will plan surgery during this admission pending cardiac clearance.  He is a patient of Dr. Marlou Starks. -admit for pain control -give PO as he is not obstructed -await CT results -mobilize -SCD/lovenox -continue with home meds Mild AKI -gentle hydration -repeat labs in AM Hx CAD/CABG -will consult cards in AM for surgical clearance  HTN -likely from pain, hydralazine PRN  Jordon Bourquin ANP-BC 09/26/2013, 5:04 PM

## 2013-09-26 NOTE — ED Notes (Signed)
Surgeon at bedside.  

## 2013-09-27 ENCOUNTER — Encounter (HOSPITAL_COMMUNITY): Payer: Self-pay | Admitting: Physician Assistant

## 2013-09-27 DIAGNOSIS — Z01810 Encounter for preprocedural cardiovascular examination: Secondary | ICD-10-CM

## 2013-09-27 DIAGNOSIS — K4090 Unilateral inguinal hernia, without obstruction or gangrene, not specified as recurrent: Principal | ICD-10-CM

## 2013-09-27 DIAGNOSIS — I25810 Atherosclerosis of coronary artery bypass graft(s) without angina pectoris: Secondary | ICD-10-CM

## 2013-09-27 LAB — BASIC METABOLIC PANEL
Anion gap: 10 (ref 5–15)
BUN: 18 mg/dL (ref 6–23)
CALCIUM: 8.6 mg/dL (ref 8.4–10.5)
CO2: 27 mEq/L (ref 19–32)
Chloride: 106 mEq/L (ref 96–112)
Creatinine, Ser: 1.26 mg/dL (ref 0.50–1.35)
GFR, EST AFRICAN AMERICAN: 60 mL/min — AB (ref >90)
GFR, EST NON AFRICAN AMERICAN: 52 mL/min — AB (ref >90)
GLUCOSE: 86 mg/dL (ref 70–99)
Potassium: 4.1 mEq/L (ref 3.7–5.3)
SODIUM: 143 meq/L (ref 137–147)

## 2013-09-27 NOTE — Consult Note (Signed)
CARDIOLOGY CONSULT NOTE   Patient ID: [name] MRN: [id number] DOB/AGE: [birth year] 80 y.o.  Admit date: 09/26/2013  Primary Physician   Georgetta Haber, MD Primary Cardiologist   Dr. Burt Knack Reason for Consultation   Pre-op eval  HPI:[name] is a 80 y.o. male with a history of CAD. He has a history of CABG in '93, patent grafts 2010, preserved EF, s/p CEA with no sig carotid stenosis by Doppler 2014, AAA w/ max diameter 3.5 cm in 2014, HLD.   He has had a hernia, and was scheduled for OP eval by Dr. Burt Knack 09/11. However, he became acutely ill with R groin pain and nausea. He was admitted 09/11 and cardiology is asked to do a pre-op evaluation.  [name] never gets chest pain. The abdominal pain has been limiting his activities for over 2 months, but he is able to walk and use a riding lawnmower without difficulty. He denies DOE, LE edema or weight change. There is no PND, orthopnea or palpitations. He feels he is otherwise doing well, except for the abdominal problem.   Past Medical History  Diagnosis Date  . Carotid stenosis     s/p Left CEA, followed by Dr. Donnetta Hutching  . Abdominal aortic aneurysm     [place] 07/2012 -3.5 cm max diameter, no change; followed by Dr. Donnetta Hutching  . Radicular low back pain   . BPH (benign prostatic hypertrophy)   . Aphthous ulcer   . Unspecified adverse effect of unspecified drug, medicinal and biological substance   . GERD (gastroesophageal reflux disease)   . History of nephrolithiasis   . Hyperlipidemia   . CAD (coronary artery disease)     s/p CABG 1993;  cath 10/10: severe native 3v CAD, S-Dx ok, S-OM1/OM2 ok, S-PDA ok, L-LAD ok, EF 60%  . Pre-syncope   . 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 HOH (hard of hearing)     hearing aids  . Memory deficits 06/19/2013  . Throat cancer   . Cervical spondylosis without myelopathy      Past Surgical History  Procedure Laterality Date  . Coronary artery bypass graft    .  Coronary artery bypass graft  1998  . Cholecystectomy    . Prostate surgery    . Back surgery    . Renal stone surgery      3 times  . Throat surgery    . Cardiac catheterization    . Carotid endarterectomy Left   . Hernia repair      unclear which side    Allergies  Allergen Reactions  . Avinza [Morphine Sulfate] Other (See Comments)    Altered mental status. "about killed him"  . Doxycycline Calcium Other (See Comments)    Causes patient chronic colitis; was hospitalized two weeks due to med.  . Vibramycin [Doxycycline Hyclate] Other (See Comments)    Causes patient colitis; was hospitalized two weeks due to med  . Statins Other (See Comments)    Muscle pain; can tolerate crestor 10 mg every other day  . Codeine Sulfate Nausea And Vomiting  . Oxycodone Other (See Comments)    Angry; altered mental status; "wild and crazy"    I have reviewed the patient's current medications . enoxaparin (LOVENOX) injection  40 mg Subcutaneous Q24H  . escitalopram  20 mg Oral Daily  . gabapentin  200 mg Oral BID     acetaminophen, acetaminophen, fentaNYL, hydrALAZINE, ondansetron  Prior to Admission medications   Medication Sig Start Date End Date Taking? Authorizing Provider  ALPHAGAN P 0.1 % SOLN Place 1 drop into both eyes daily. 04/24/13  Yes Historical Provider, MD  aspirin EC 81 MG tablet Take 81 mg by mouth 3 (three) times a week.   Yes Historical Provider, MD  B Complex Vitamins (VITAMIN B COMPLEX PO) Take 50 mg by mouth every other day.   Yes Historical Provider, MD  escitalopram (LEXAPRO) 20 MG tablet Take 1 tablet (20 mg total) by mouth daily. 10/21/12  Yes Ricard Dillon, MD  gabapentin (NEURONTIN) 100 MG capsule Take 200 mg by mouth 2 (two) times daily.   Yes Historical Provider, MD  rosuvastatin (CRESTOR) 20 MG tablet Take 10 mg by mouth every other day.   Yes Historical Provider, MD  vitamin E (VITAMIN E) 400 UNIT capsule Take 400 Units by mouth every other day.    Yes  Historical Provider, MD     History   Social History  . Marital Status: Married    Spouse Name: N/A    Number of Children: 3  . Years of Education: 11th   Occupational History  . Retired    Social History Main Topics  . Smoking status: Former Smoker    Quit date: 01/16/2006  . Smokeless tobacco: Never Used  . Alcohol Use: No  . Drug Use: No  . Sexual Activity: Not Currently   Other Topics Concern  . Not on file   Social History Narrative   Lives with wife.    Family Status  Relation Status Death Age  . Father Deceased 17  . Mother Deceased 83  . Sister Deceased     cancer  . Brother Alive   . Sister Deceased     chf  . Brother Deceased   . Brother Deceased     COPD, ANEURYSM   Family History  Problem Relation Age of Onset  . Coronary artery disease    . Heart disease Father   . Stroke Father   . Stroke Brother   . Coronary artery disease Brother   . Heart disease Brother      ROS:  Full 14 point review of systems complete and found to be negative unless listed above.  Physical Exam: Blood pressure 150/92, pulse 51, temperature 97.9 F (36.6 C), temperature source Oral, resp. rate 16, height 5\' 11"  (1.803 m), weight 173 lb 15.8 oz (78.92 kg), SpO2 95.00%.  General: Well developed, well nourished, male in no acute distress Head: Eyes PERRLA, No xanthomas.   Normocephalic and atraumatic, oropharynx without edema or exudate. Dentition: poor Lungs: CTA bilaterally Heart: HRRR S1 S2, no rub/gallop, no significant murmur. pulses are 2+ all 4 extrem.   Neck: Left carotid bruit. No lymphadenopathy.  JVD not elevated. Abdomen: Bowel sounds present, abdomen soft and non-tender without masses or hernias noted. Msk:  No spine or cva tenderness. No weakness, no joint deformities or effusions. Extremities: No clubbing or cyanosis. No edema.  Neuro: Alert and oriented X 3. No focal deficits noted. Psych:  Good affect, responds appropriately Skin: No rashes or lesions  noted.  Labs:   Lab Results  Component Value Date   WBC 5.4 09/26/2013   HGB 15.6 09/26/2013   HCT 46.0 09/26/2013   MCV 95.8 09/26/2013   PLT 167 09/26/2013    Recent Labs Lab 09/26/13 1533  09/27/13 0720  NA 144  < > 143  K 4.2  < > 4.1  CL 107  < > 106  CO2 26  --  27  BUN 17  < > 18  CREATININE 1.33  < > 1.26  CALCIUM 8.6  --  8.6  PROT 6.5  --   --   BILITOT 0.8  --   --   ALKPHOS 77  --   --   ALT 16  --   --   AST 25  --   --   GLUCOSE 86  < > 86  ALBUMIN 3.7  --   --   < > = values in this interval not displayed.  ECG:  pending  Radiology:  Ct Abdomen Pelvis W Contrast 09/26/2013   CLINICAL DATA:  80 year old male. History of right groin pain. Patient has a history of abdominal aortic aneurysm. Prior cholecystectomy, prostate surgery, back surgery, renal stone surgery.  EXAM: CT ABDOMEN AND PELVIS WITH CONTRAST  TECHNIQUE: Multidetector CT imaging of the abdomen and pelvis was performed using the standard protocol following bolus administration of intravenous contrast.  CONTRAST:  187mL OMNIPAQUE IOHEXOL 300 MG/ML  SOLN  COMPARISON:  Abdomen CT 06/19/2009  FINDINGS: Chest:  Unremarkable appearance of the subcutaneous tissues of the lower chest.  Heart size appears within normal limits. No pericardial fluid/ thickening. Calcifications/stent in distribution the right coronary artery and left anterior descending coronary arteries.  No confluent airspace disease. Atelectasis at the bilateral bases. No pleural effusion.  Surgical changes of prior median sternotomy.  Abdomen:  Similar number and distribution of low-density cystic structures within the liver parenchyma, however, there has been interval growth of several. Largest within the left liver lobe involving segments 2 and 3 measured 5.5 cm on prior currently measuring 5.9 cm. Within the right liver lobe, segment 6, lesion measured 4.2 cm now measuring 5.3 cm. There are multiple additional lesions throughout the liver. All of the  measurable lesions measure fluid density without appreciable wall or nodular enhancement. The remainder of the liver parenchyma appears to be uniform in attenuation/enhancement.  No intrahepatic ductal dilatation.  Surgical change of cholecystectomy.  Mild dilation of the common bile duct, which is unchanged from the comparison. No radiopaque gallstone within the visualized common bile duct.  Unremarkable appearance of the spleen.  Unremarkable appearance the bilateral adrenal glands.  No evidence of hydronephrosis. Unremarkable appearance of the bilateral ureters.  Enteric contrast extends to the transverse colon. No abnormally distended small bowel or colon. No mesenteric inflammatory changes or fluid.  Mild amount of diverticular disease. No associated inflammatory changes.  No peripancreatic fluid or inflammatory changes. Uniform enhancement/ attenuation pancreatic parenchyma.  Unremarkable appearance of the urinary bladder.  Multiple coarse clumped calcifications of the prostate which measures 4.4 cm in transverse diameter.  Asymmetric size of the seminal vesicles, right greater than left. No associated an inflammatory changes.  Bilateral fact containing inguinal hernia.  Musculoskeletal:  No displaced acute fracture identified. Multilevel degenerative changes of the visualized spine. Multilevel disc disease with vacuum disc phenomenon present throughout the lumbar spine. No significant bony canal stenosis, although there are multiple disc bulges present, most pronounced at L3-S1 with associated neural foraminal narrowing secondary to facet disease.  Vascular:  Atherosclerotic calcifications of the lower thoracic aorta and abdominal aorta. Calcifications involve the origin of mesenteric vessels, including celiac artery, superior mesenteric artery, left greater than right renal artery is, in the inferior mesenteric artery.  Greatest diameter of the infrarenal abdominal aorta measures 32 mm which is not  significantly greater than the comparison CT dated  06/19/2009. No dissection flap or periaortic fluid.  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  Calcifications extend to the bilateral iliofemoral system.  IMPRESSION: No acute CT finding to account for the patient's abdominal pain.  Multiple low-density liver cystic lesions are again identified, similar in number and distribution though some have enlarged. These are favored to represent benign cysts given the imaging appearance, however, if further evaluation were warranted, ultrasound or MRI could be considered.  Changes of atherosclerosis/ coronary artery disease with associated ectasia of the infrarenal abdominal aorta. Appearance is unchanged from the comparison CT of 2011, including the greatest diameter of the abdominal aorta, which measures approximately 3.2 cm.  Multilevel all disc degeneration and facet disease of the lumbar spine.  Signed,  Dulcy Fanny. Earleen Newport, DO  [facility]  [facility]   Electronically Signed   By: Corrie Mckusick O.D.   On: 09/26/2013 17:43    ASSESSMENT AND PLAN:   The patient was seen today by Dr. Sallyanne Kuster, the patient evaluated and the data reviewed.  Active Problems:   Right inguinal hernia - per [facility], surgery planned this admit.    Pre-op eval - no ongoing ischemic symptoms but no recent assessment of cardiac status. CABG grafts patent and EF preserved in 2010. Will order ECG and echo. F/u on results. If EF still OK, he is probably at acceptable risk for the surgery, with no further workup indicated.   SignedRosaria Ferries, PA-C 09/27/2013 1:15 PM Beeper [phone]  Co-Sign MD I have seen and examined the patient along with Rosaria Ferries, PA-C.  I have reviewed the chart, notes and new data.  I agree with PA's note.  Key new complaints: intermittent "burning" hernia related pain, no angina, no dyspnea lying flat. Good preop  functional status Key examination changes: no clinical HF Key new findings / data: ECG - SR with frequent PACs, otherwise normal.  PLAN: Low to moderate risk for CV complications with planned hernia surgery. I do not think hernia surgery should be delayed for additional cardiology workup.  Sanda Klein, MD, [place] [phone] 09/27/2013, 4:09 PM

## 2013-09-27 NOTE — Progress Notes (Signed)
Patient ID: [name], male   DOB: 03/25/1931, 80 y.o.   MRN: [id number]     [facility]      [address] [place], [place] [address]    Phone: [phone] FAX: [phone]     Subjective: Some pain.  No n/v.  Passing flatus.  Tolerating POs. Voiding.    Objective:  Vital signs:  Filed Vitals:   09/26/13 1809 09/26/13 2213 09/27/13 0104 09/27/13 0622  BP: 178/89 163/76 155/76 167/93  Pulse: 56 54 60 56  Temp: 98 F (36.7 C) 97.6 F (36.4 C) 98.2 F (36.8 C) 97.7 F (36.5 C)  TempSrc: Oral Oral Oral Oral  Resp: '18 16 16 16  ' Height: '5\' 11"'  (1.803 m)     Weight: 173 lb 15.8 oz (78.92 kg)     SpO2: 97% 97% 96% 97%    Last BM Date: 09/25/13  Intake/Output   Yesterday:    This shift:   none recorded  Physical Exam: General: Pt awake/alert/oriented x4 in no acute distress Chest: cta. No chest wall pain w good excursion CV:  Pulses intact.  Regular rhythm MS: Normal AROM mjr joints.  No obvious deformity Abdomen: Soft.  Nondistended.  Moderate TTP right groin, easily reducible right inguinal hernia.  No evidence of peritonitis.  No incarcerated hernias. Ext:  SCDs BLE.  No mjr edema.  No cyanosis Skin: No petechiae / purpura   Problem List:   Active Problems:   Right inguinal hernia    Results:   Labs: Results for orders placed during the hospital encounter of 09/26/13 (from the past 48 hour(s))  CBC WITH DIFFERENTIAL     Status: None   Collection Time    09/26/13  3:33 PM      Result Value Ref Range   WBC 5.4  4.0 - 10.5 K/uL   RBC 4.76  4.22 - 5.81 MIL/uL   Hemoglobin 14.8  13.0 - 17.0 g/dL   HCT 45.6  39.0 - 52.0 %   MCV 95.8  78.0 - 100.0 fL   MCH 31.1  26.0 - 34.0 pg   MCHC 32.5  30.0 - 36.0 g/dL   RDW 14.1  11.5 - 15.5 %   Platelets 167  150 - 400 K/uL   Neutrophils Relative % 62  43 - 77 %   Neutro Abs 3.3  1.7 - 7.7 K/uL   Lymphocytes Relative 24  12 - 46 %   Lymphs Abs 1.3  0.7 - 4.0 K/uL    Monocytes Relative 11  3 - 12 %   Monocytes Absolute 0.6  0.1 - 1.0 K/uL   Eosinophils Relative 3  0 - 5 %   Eosinophils Absolute 0.2  0.0 - 0.7 K/uL   Basophils Relative 0  0 - 1 %   Basophils Absolute 0.0  0.0 - 0.1 K/uL  COMPREHENSIVE METABOLIC PANEL     Status: Abnormal   Collection Time    09/26/13  3:33 PM      Result Value Ref Range   Sodium 144  137 - 147 mEq/L   Potassium 4.2  3.7 - 5.3 mEq/L   Chloride 107  96 - 112 mEq/L   CO2 26  19 - 32 mEq/L   Glucose, Bld 86  70 - 99 mg/dL   BUN 17  6 - 23 mg/dL   Creatinine, Ser 1.33  0.50 - 1.35 mg/dL   Calcium 8.6  8.4 - 10.5 mg/dL  Total Protein 6.5  6.0 - 8.3 g/dL   Albumin 3.7  3.5 - 5.2 g/dL   AST 25  0 - 37 U/L   ALT 16  0 - 53 U/L   Alkaline Phosphatase 77  39 - 117 U/L   Total Bilirubin 0.8  0.3 - 1.2 mg/dL   GFR calc non Af Amer 49 (*) >90 mL/min   GFR calc Af Amer 57 (*) >90 mL/min   Comment: (NOTE)     The eGFR has been calculated using the CKD EPI equation.     This calculation has not been validated in all clinical situations.     eGFR's persistently <90 mL/min signify possible Chronic Kidney     Disease.   Anion gap 11  5 - 15  I-STAT CG4 LACTIC ACID, ED     Status: Abnormal   Collection Time    09/26/13  3:35 PM      Result Value Ref Range   Lactic Acid, Venous 0.41 (*) 0.5 - 2.2 mmol/L  I-STAT CHEM 8, ED     Status: Abnormal   Collection Time    09/26/13  3:41 PM      Result Value Ref Range   Sodium 143  137 - 147 mEq/L   Potassium 4.0  3.7 - 5.3 mEq/L   Chloride 106  96 - 112 mEq/L   BUN 19  6 - 23 mg/dL   Creatinine, Ser 1.40 (*) 0.50 - 1.35 mg/dL   Glucose, Bld 88  70 - 99 mg/dL   Calcium, Ion 1.12 (*) 1.13 - 1.30 mmol/L   TCO2 27  0 - 100 mmol/L   Hemoglobin 15.6  13.0 - 17.0 g/dL   HCT 46.0  39.0 - 52.0 %    Imaging / Studies: Ct Abdomen Pelvis W Contrast  09/26/2013   CLINICAL DATA:  80 year old male. History of right groin pain. Patient has a history of abdominal aortic aneurysm. Prior  cholecystectomy, prostate surgery, back surgery, renal stone surgery.  EXAM: CT ABDOMEN AND PELVIS WITH CONTRAST  TECHNIQUE: Multidetector CT imaging of the abdomen and pelvis was performed using the standard protocol following bolus administration of intravenous contrast.  CONTRAST:  110m OMNIPAQUE IOHEXOL 300 MG/ML  SOLN  COMPARISON:  Abdomen CT 06/19/2009  FINDINGS: Chest:  Unremarkable appearance of the subcutaneous tissues of the lower chest.  Heart size appears within normal limits. No pericardial fluid/ thickening. Calcifications/stent in distribution the right coronary artery and left anterior descending coronary arteries.  No confluent airspace disease. Atelectasis at the bilateral bases. No pleural effusion.  Surgical changes of prior median sternotomy.  Abdomen:  Similar number and distribution of low-density cystic structures within the liver parenchyma, however, there has been interval growth of several. Largest within the left liver lobe involving segments 2 and 3 measured 5.5 cm on prior currently measuring 5.9 cm. Within the right liver lobe, segment 6, lesion measured 4.2 cm now measuring 5.3 cm. There are multiple additional lesions throughout the liver. All of the measurable lesions measure fluid density without appreciable wall or nodular enhancement. The remainder of the liver parenchyma appears to be uniform in attenuation/enhancement.  No intrahepatic ductal dilatation.  Surgical change of cholecystectomy.  Mild dilation of the common bile duct, which is unchanged from the comparison. No radiopaque gallstone within the visualized common bile duct.  Unremarkable appearance of the spleen.  Unremarkable appearance the bilateral adrenal glands.  No evidence of hydronephrosis. Unremarkable appearance of the bilateral ureters.  Enteric contrast  extends to the transverse colon. No abnormally distended small bowel or colon. No mesenteric inflammatory changes or fluid.  Mild amount of diverticular  disease. No associated inflammatory changes.  No peripancreatic fluid or inflammatory changes. Uniform enhancement/ attenuation pancreatic parenchyma.  Unremarkable appearance of the urinary bladder.  Multiple coarse clumped calcifications of the prostate which measures 4.4 cm in transverse diameter.  Asymmetric size of the seminal vesicles, right greater than left. No associated an inflammatory changes.  Bilateral fact containing inguinal hernia.  Musculoskeletal:  No displaced acute fracture identified. Multilevel degenerative changes of the visualized spine. Multilevel disc disease with vacuum disc phenomenon present throughout the lumbar spine. No significant bony canal stenosis, although there are multiple disc bulges present, most pronounced at L3-S1 with associated neural foraminal narrowing secondary to facet disease.  Vascular:  Atherosclerotic calcifications of the lower thoracic aorta and abdominal aorta. Calcifications involve the origin of mesenteric vessels, including celiac artery, superior mesenteric artery, left greater than right renal artery is, in the inferior mesenteric artery.  Greatest diameter of the infrarenal abdominal aorta measures 32 mm which is not significantly greater than the comparison CT dated 06/19/2009. No dissection flap or periaortic fluid.  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  Calcifications extend to the bilateral iliofemoral system.  IMPRESSION: No acute CT finding to account for the patient's abdominal pain.  Multiple low-density liver cystic lesions are again identified, similar in number and distribution though some have enlarged. These are favored to represent benign cysts given the imaging appearance, however, if further evaluation were warranted, ultrasound or MRI could be considered.  Changes of atherosclerosis/ coronary artery disease with associated ectasia of the infrarenal abdominal aorta. Appearance is unchanged from  the comparison CT of 2011, including the greatest diameter of the abdominal aorta, which measures approximately 3.2 cm.  Multilevel all disc degeneration and facet disease of the lumbar spine.  Signed,  Dulcy Fanny. Earleen Newport, DO  [facility]  [facility]   Electronically Signed   By: Corrie Mckusick O.D.   On: 09/26/2013 17:43    Medications / Allergies:  Scheduled Meds: . enoxaparin (LOVENOX) injection  40 mg Subcutaneous Q24H  . escitalopram  20 mg Oral Daily  . gabapentin  200 mg Oral BID   Continuous Infusions:  PRN Meds:.acetaminophen, acetaminophen, fentaNYL, hydrALAZINE, ondansetron  Antibiotics: Anti-infectives   None        Assessment/Plan Right inguinal hernia  -does not need urgent surgery. Will plan surgery during this admission pending cardiac clearance. He is a patient of Dr. Marlou Starks.  -admit for pain control  -give PO as he is not obstructed  -CT of abdomen and pelvis without acute abnormalitis  -mobilize  -SCD/lovenox  -continue with home meds  Mild AKI  -gentle hydration  -await labs Hx CAD/CABG  -consult cards for clearance HTN  -likely from pain, hydralazine PRN Cystic hepatic lesions-likely benign cyst, may consider further outpatient work up with MRI or [place] AAA-stable 3.2cm, followed by Dr. Junius Finner, ANP-BC [facility] Surgery Pager [phone](7A-4:30P) For consults and floor pages call [phone](7A-4:30P)  09/27/2013 7:53 AM

## 2013-09-27 NOTE — H&P (Signed)
I have seen and examined the patient and agree with the assessment and plans. Hernia not incarcerated on exam  Cherish Runde A. Ninfa Linden  MD, FACS

## 2013-09-27 NOTE — Progress Notes (Signed)
I have seen and examined the patient and agree with the assessment and plans.  Keisi Eckford A. Ren Aspinall  MD, FACS  

## 2013-09-28 DIAGNOSIS — I369 Nonrheumatic tricuspid valve disorder, unspecified: Secondary | ICD-10-CM

## 2013-09-28 DIAGNOSIS — N183 Chronic kidney disease, stage 3 unspecified: Secondary | ICD-10-CM | POA: Diagnosis present

## 2013-09-28 DIAGNOSIS — I739 Peripheral vascular disease, unspecified: Secondary | ICD-10-CM

## 2013-09-28 LAB — SURGICAL PCR SCREEN
MRSA, PCR: NEGATIVE
Staphylococcus aureus: NEGATIVE

## 2013-09-28 MED ORDER — ATORVASTATIN CALCIUM 40 MG PO TABS: 40.0000 mg | ORAL_TABLET | Freq: Every day | ORAL | Status: DC

## 2013-09-28 MED ORDER — ATORVASTATIN CALCIUM 10 MG PO TABS
10.0000 mg | ORAL_TABLET | ORAL | Status: DC
  Administered 2013-09-28 – 2013-09-30 (×2): 10 mg via ORAL
  Filled 2013-09-28 (×2): qty 1

## 2013-09-28 MED ORDER — DEXTROSE-NACL 5-0.9 % IV SOLN
INTRAVENOUS | Status: DC
  Administered 2013-09-29: via INTRAVENOUS

## 2013-09-28 MED ORDER — ASPIRIN 81 MG PO CHEW
81.0000 mg | CHEWABLE_TABLET | Freq: Every day | ORAL | Status: DC
  Administered 2013-09-28 – 2013-09-30 (×3): 81 mg via ORAL
  Filled 2013-09-28 (×4): qty 1

## 2013-09-28 NOTE — Progress Notes (Signed)
Patient ID: INEZ ROSATO, male   DOB: January 15, 1932, 80 y.o.   MRN: [id number]     [facility]      [address] [place], [place] [address]    Phone: [phone] FAX: [phone]     Subjective: No pain today.  Afebrile.  VSS.  Ambulating in hallways.  Voiding.  Denies cp, sob, palpitations.   Objective:  Vital signs:  Filed Vitals:   09/27/13 1400 09/27/13 1432 09/27/13 2111 09/28/13 0608  BP: 126/70 141/67 172/74 150/92  Pulse: 56 63 99 67  Temp: 98.2 F (36.8 C) 98.3 F (36.8 C) 98.2 F (36.8 C) 97.9 F (36.6 C)  TempSrc: Oral Oral Oral Oral  Resp: '16 18 16 16  ' Height:      Weight:      SpO2: 96% 97% 96% 98%    Last BM Date: 09/25/13  Intake/Output   Yesterday:  09/12 0701 - 09/13 0700 In: 960 [P.O.:960] Out: -  This shift:    I/O last 3 completed shifts: In: 2 [P.O.:960] Out: -    Physical Exam:  General: Pt awake/alert/oriented x4 in no acute distress  Chest: cta. No chest wall pain w good excursion  CV: Pulses intact. Regular rhythm  MS: Normal AROM mjr joints. No obvious deformity  Abdomen: Soft. Nondistended. Mild TTP right groin, easily reducible right inguinal hernia. No evidence of peritonitis. No incarcerated hernias.  Ext: SCDs BLE. No mjr edema. No cyanosis  Skin: No petechiae / purpura   Problem List:   Principal Problem:   Incarcerated hernia Active Problems:   Dyslpidemia-on statin   CABG '93, patent grafts 2010, Nl LVF   PVD- hx CEA,patent carotids by doppler July 2015   Right inguinal hernia   Renal insufficiency    Results:   Labs: Results for orders placed during the hospital encounter of 09/26/13 (from the past 48 hour(s))  CBC WITH DIFFERENTIAL     Status: None   Collection Time    09/26/13  3:33 PM      Result Value Ref Range   WBC 5.4  4.0 - 10.5 K/uL   RBC 4.76  4.22 - 5.81 MIL/uL   Hemoglobin 14.8  13.0 - 17.0 g/dL   HCT 45.6  39.0 - 52.0 %   MCV 95.8  78.0 - 100.0  fL   MCH 31.1  26.0 - 34.0 pg   MCHC 32.5  30.0 - 36.0 g/dL   RDW 14.1  11.5 - 15.5 %   Platelets 167  150 - 400 K/uL   Neutrophils Relative % 62  43 - 77 %   Neutro Abs 3.3  1.7 - 7.7 K/uL   Lymphocytes Relative 24  12 - 46 %   Lymphs Abs 1.3  0.7 - 4.0 K/uL   Monocytes Relative 11  3 - 12 %   Monocytes Absolute 0.6  0.1 - 1.0 K/uL   Eosinophils Relative 3  0 - 5 %   Eosinophils Absolute 0.2  0.0 - 0.7 K/uL   Basophils Relative 0  0 - 1 %   Basophils Absolute 0.0  0.0 - 0.1 K/uL  COMPREHENSIVE METABOLIC PANEL     Status: Abnormal   Collection Time    09/26/13  3:33 PM      Result Value Ref Range   Sodium 144  137 - 147 mEq/L   Potassium 4.2  3.7 - 5.3 mEq/L   Chloride 107  96 - 112 mEq/L  CO2 26  19 - 32 mEq/L   Glucose, Bld 86  70 - 99 mg/dL   BUN 17  6 - 23 mg/dL   Creatinine, Ser 1.33  0.50 - 1.35 mg/dL   Calcium 8.6  8.4 - 10.5 mg/dL   Total Protein 6.5  6.0 - 8.3 g/dL   Albumin 3.7  3.5 - 5.2 g/dL   AST 25  0 - 37 U/L   ALT 16  0 - 53 U/L   Alkaline Phosphatase 77  39 - 117 U/L   Total Bilirubin 0.8  0.3 - 1.2 mg/dL   GFR calc non Af Amer 49 (*) >90 mL/min   GFR calc Af Amer 57 (*) >90 mL/min   Comment: (NOTE)     The eGFR has been calculated using the CKD EPI equation.     This calculation has not been validated in all clinical situations.     eGFR's persistently <90 mL/min signify possible Chronic Kidney     Disease.   Anion gap 11  5 - 15  I-STAT CG4 LACTIC ACID, ED     Status: Abnormal   Collection Time    09/26/13  3:35 PM      Result Value Ref Range   Lactic Acid, Venous 0.41 (*) 0.5 - 2.2 mmol/L  I-STAT CHEM 8, ED     Status: Abnormal   Collection Time    09/26/13  3:41 PM      Result Value Ref Range   Sodium 143  137 - 147 mEq/L   Potassium 4.0  3.7 - 5.3 mEq/L   Chloride 106  96 - 112 mEq/L   BUN 19  6 - 23 mg/dL   Creatinine, Ser 1.40 (*) 0.50 - 1.35 mg/dL   Glucose, Bld 88  70 - 99 mg/dL   Calcium, Ion 1.12 (*) 1.13 - 1.30 mmol/L   TCO2 27  0  - 100 mmol/L   Hemoglobin 15.6  13.0 - 17.0 g/dL   HCT 46.0  39.0 - 08.1 %  BASIC METABOLIC PANEL     Status: Abnormal   Collection Time    09/27/13  7:20 AM      Result Value Ref Range   Sodium 143  137 - 147 mEq/L   Potassium 4.1  3.7 - 5.3 mEq/L   Chloride 106  96 - 112 mEq/L   CO2 27  19 - 32 mEq/L   Glucose, Bld 86  70 - 99 mg/dL   BUN 18  6 - 23 mg/dL   Creatinine, Ser 1.26  0.50 - 1.35 mg/dL   Calcium 8.6  8.4 - 10.5 mg/dL   GFR calc non Af Amer 52 (*) >90 mL/min   GFR calc Af Amer 60 (*) >90 mL/min   Comment: (NOTE)     The eGFR has been calculated using the CKD EPI equation.     This calculation has not been validated in all clinical situations.     eGFR's persistently <90 mL/min signify possible Chronic Kidney     Disease.   Anion gap 10  5 - 15    Imaging / Studies: Ct Abdomen Pelvis W Contrast  09/26/2013   CLINICAL DATA:  80 year old male. History of right groin pain. Patient has a history of abdominal aortic aneurysm. Prior cholecystectomy, prostate surgery, back surgery, renal stone surgery.  EXAM: CT ABDOMEN AND PELVIS WITH CONTRAST  TECHNIQUE: Multidetector CT imaging of the abdomen and pelvis was performed using the standard protocol following bolus  administration of intravenous contrast.  CONTRAST:  166m OMNIPAQUE IOHEXOL 300 MG/ML  SOLN  COMPARISON:  Abdomen CT 06/19/2009  FINDINGS: Chest:  Unremarkable appearance of the subcutaneous tissues of the lower chest.  Heart size appears within normal limits. No pericardial fluid/ thickening. Calcifications/stent in distribution the right coronary artery and left anterior descending coronary arteries.  No confluent airspace disease. Atelectasis at the bilateral bases. No pleural effusion.  Surgical changes of prior median sternotomy.  Abdomen:  Similar number and distribution of low-density cystic structures within the liver parenchyma, however, there has been interval growth of several. Largest within the left liver lobe  involving segments 2 and 3 measured 5.5 cm on prior currently measuring 5.9 cm. Within the right liver lobe, segment 6, lesion measured 4.2 cm now measuring 5.3 cm. There are multiple additional lesions throughout the liver. All of the measurable lesions measure fluid density without appreciable wall or nodular enhancement. The remainder of the liver parenchyma appears to be uniform in attenuation/enhancement.  No intrahepatic ductal dilatation.  Surgical change of cholecystectomy.  Mild dilation of the common bile duct, which is unchanged from the comparison. No radiopaque gallstone within the visualized common bile duct.  Unremarkable appearance of the spleen.  Unremarkable appearance the bilateral adrenal glands.  No evidence of hydronephrosis. Unremarkable appearance of the bilateral ureters.  Enteric contrast extends to the transverse colon. No abnormally distended small bowel or colon. No mesenteric inflammatory changes or fluid.  Mild amount of diverticular disease. No associated inflammatory changes.  No peripancreatic fluid or inflammatory changes. Uniform enhancement/ attenuation pancreatic parenchyma.  Unremarkable appearance of the urinary bladder.  Multiple coarse clumped calcifications of the prostate which measures 4.4 cm in transverse diameter.  Asymmetric size of the seminal vesicles, right greater than left. No associated an inflammatory changes.  Bilateral fact containing inguinal hernia.  Musculoskeletal:  No displaced acute fracture identified. Multilevel degenerative changes of the visualized spine. Multilevel disc disease with vacuum disc phenomenon present throughout the lumbar spine. No significant bony canal stenosis, although there are multiple disc bulges present, most pronounced at L3-S1 with associated neural foraminal narrowing secondary to facet disease.  Vascular:  Atherosclerotic calcifications of the lower thoracic aorta and abdominal aorta. Calcifications involve the origin of  mesenteric vessels, including celiac artery, superior mesenteric artery, left greater than right renal artery is, in the inferior mesenteric artery.  Greatest diameter of the infrarenal abdominal aorta measures 32 mm which is not significantly greater than the comparison CT dated 06/19/2009. No dissection flap or periaortic fluid.  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  Calcifications extend to the bilateral iliofemoral system.  IMPRESSION: No acute CT finding to account for the patient's abdominal pain.  Multiple low-density liver cystic lesions are again identified, similar in number and distribution though some have enlarged. These are favored to represent benign cysts given the imaging appearance, however, if further evaluation were warranted, ultrasound or MRI could be considered.  Changes of atherosclerosis/ coronary artery disease with associated ectasia of the infrarenal abdominal aorta. Appearance is unchanged from the comparison CT of 2011, including the greatest diameter of the abdominal aorta, which measures approximately 3.2 cm.  Multilevel all disc degeneration and facet disease of the lumbar spine.  Signed,  JDulcy Fanny WEarleen Newport DO  [facility]  [facility]iology   Electronically Signed   By: JCorrie MckusickO.D.   On: 09/26/2013 17:43    Medications / Allergies:  Scheduled Meds: .  aspirin  81 mg Oral Daily  . atorvastatin  10 mg Oral QODAY  . enoxaparin (LOVENOX) injection  40 mg Subcutaneous Q24H  . escitalopram  20 mg Oral Daily  . gabapentin  200 mg Oral BID   Continuous Infusions:  PRN Meds:.acetaminophen, acetaminophen, fentaNYL, hydrALAZINE, ondansetron  Antibiotics: Anti-infectives   None      Assessment/Plan  Right inguinal hernia  -tentatively plan for surgery tomorrow -NPO after midnight -start IVF afer midnight - pain control  -CT of abdomen and pelvis without acute abnormalitis   -mobilize  -SCD/lovenox  -continue with home meds  -low to moderate risk for CV complications, appreciate cards assistance Mild AKI  -resolved Hx CAD/CABG  -consult cards for clearance  HTN  -improved with pain control, hydralazine PRN  Cystic hepatic lesions-likely benign cyst, may consider further outpatient work up with MRI or [place]  AAA-stable 3.2cm, followed by Dr. Junius Finner, ANP-BC [facility] Pager [phone](7A-4:30P) For consults and floor pages call [phone](7A-4:30P)  09/28/2013 8:13 AM

## 2013-09-28 NOTE — Progress Notes (Signed)
    Subjective:  No chest apin.  Objective:  Vital Signs in the last 24 hours: Temp:  [97.3 F (36.3 C)-98.3 F (36.8 C)] 97.9 F (36.6 C) (09/13 7096) Pulse Rate:  [51-99] 67 (09/13 0608) Resp:  [16-18] 16 (09/13 0608) BP: (126-172)/(67-112) 150/92 mmHg (09/13 0608) SpO2:  [95 %-98 %] 98 % (09/13 0608)  Intake/Output from previous day:  Intake/Output Summary (Last 24 hours) at 09/28/13 0743 Last data filed at 09/27/13 1820  Gross per 24 hour  Intake    960 ml  Output      0 ml  Net    960 ml    Physical Exam: General appearance: alert, cooperative and no distress Lungs: clear to auscultation bilaterally Heart: regular rate and rhythm   Rate: 66  Rhythm: normal sinus rhythm  Lab Results:  Recent Labs  09/26/13 1533 09/26/13 1541  WBC 5.4  --   HGB 14.8 15.6  PLT 167  --     Recent Labs  09/26/13 1533 09/26/13 1541 09/27/13 0720  NA 144 143 143  K 4.2 4.0 4.1  CL 107 106 106  CO2 26  --  27  GLUCOSE 86 88 86  BUN 17 19 18   CREATININE 1.33 1.40* 1.26   No results found for this basename: TROPONINI, CK, MB,  in the last 72 hours No results found for this basename: INR,  in the last 72 hours  Imaging: Imaging results have been reviewed  EKG NSR, PACs  Assessment/Plan:   Principal Problem:   Incarcerated hernia Active Problems:   CABG '93, patent grafts 2010, Nl LVF   PVD- hx CEA,patent carotids by doppler July 2015   Renal insufficiency   Dyslpidemia-on statin   Right inguinal hernia    PLAN: See Dr Victorino December note, OK for surgery from our standpoint,( looks like it will be in AM) Will resume home meds, He has baseline bradycardia, no beta blocker.   Kerin Ransom PA-C Beeper [phone] 09/28/2013, 7:43 AM   I have seen and examined the patient along with Kerin Ransom PA-C.  I have reviewed the chart, notes and new data.  I agree with PA's note.  PLAN: Will review echo when available, but would not delay surgery for it. Will follow with  you postop.  Sanda Klein, MD, [place] [phone] 09/28/2013, 9:38 AM

## 2013-09-28 NOTE — Progress Notes (Signed)
  Echocardiogram 2D Echocardiogram has been performed.  [name] 09/28/2013, 3:09 PM

## 2013-09-28 NOTE — Progress Notes (Signed)
No acute issues.  Appreciate cardiology input.  OR Monday?

## 2013-09-29 ENCOUNTER — Encounter (HOSPITAL_COMMUNITY): Payer: Medicare Other | Admitting: Anesthesiology

## 2013-09-29 ENCOUNTER — Encounter (HOSPITAL_COMMUNITY): Payer: Self-pay | Admitting: Certified Registered"

## 2013-09-29 ENCOUNTER — Inpatient Hospital Stay (HOSPITAL_COMMUNITY): Payer: Medicare Other | Admitting: Anesthesiology

## 2013-09-29 ENCOUNTER — Encounter (HOSPITAL_COMMUNITY): Admission: EM | Disposition: A | Discharge status: Home / Self Care | Payer: Self-pay | Source: Home / Self Care

## 2013-09-29 HISTORY — PX: INGUINAL HERNIA REPAIR: SHX194

## 2013-09-29 HISTORY — PX: INSERTION OF MESH: SHX5868

## 2013-09-29 SURGERY — REPAIR, HERNIA, INGUINAL, ADULT
Anesthesia: General | Site: Groin | Laterality: Right

## 2013-09-29 MED ORDER — CEFAZOLIN SODIUM-DEXTROSE 2-3 GM-% IV SOLR
2.0000 g | Freq: Once | INTRAVENOUS | Status: AC
  Administered 2013-09-29: 2 g via INTRAVENOUS
  Filled 2013-09-29: qty 50

## 2013-09-29 MED ORDER — FENTANYL CITRATE 0.05 MG/ML IJ SOLN
INTRAMUSCULAR | Status: DC | PRN
  Administered 2013-09-29: 25 ug via INTRAVENOUS
  Administered 2013-09-29: 50 ug via INTRAVENOUS
  Administered 2013-09-29: 25 ug via INTRAVENOUS

## 2013-09-29 MED ORDER — FENTANYL CITRATE 0.05 MG/ML IJ SOLN
INTRAMUSCULAR | Status: AC
  Filled 2013-09-29: qty 2

## 2013-09-29 MED ORDER — BUPIVACAINE HCL (PF) 0.25 % IJ SOLN
INTRAMUSCULAR | Status: AC
Start: 2013-09-29 — End: 2013-09-29
  Filled 2013-09-29: qty 30

## 2013-09-29 MED ORDER — FENTANYL CITRATE 0.05 MG/ML IJ SOLN
INTRAMUSCULAR | Status: AC
  Filled 2013-09-29: qty 5

## 2013-09-29 MED ORDER — EPHEDRINE SULFATE 50 MG/ML IJ SOLN
INTRAMUSCULAR | Status: AC
  Filled 2013-09-29: qty 1

## 2013-09-29 MED ORDER — EPHEDRINE SULFATE 50 MG/ML IJ SOLN
INTRAMUSCULAR | Status: DC | PRN
  Administered 2013-09-29 (×2): 5 mg via INTRAVENOUS

## 2013-09-29 MED ORDER — PROPOFOL 10 MG/ML IV BOLUS
INTRAVENOUS | Status: AC
  Filled 2013-09-29: qty 20

## 2013-09-29 MED ORDER — FENTANYL CITRATE 0.05 MG/ML IJ SOLN
25.0000 ug | INTRAMUSCULAR | Status: DC | PRN
  Administered 2013-09-29: 25 ug via INTRAVENOUS
  Administered 2013-09-29: 50 ug via INTRAVENOUS
  Administered 2013-09-29 (×3): 25 ug via INTRAVENOUS

## 2013-09-29 MED ORDER — 0.9 % SODIUM CHLORIDE (POUR BTL) OPTIME
TOPICAL | Status: DC | PRN
  Administered 2013-09-29: 1000 mL

## 2013-09-29 MED ORDER — TRAMADOL HCL 50 MG PO TABS
50.0000 mg | ORAL_TABLET | Freq: Four times a day (QID) | ORAL | Status: DC | PRN
  Administered 2013-09-30 (×2): 50 mg via ORAL
  Filled 2013-09-29 (×4): qty 1

## 2013-09-29 MED ORDER — ARTIFICIAL TEARS OP OINT
TOPICAL_OINTMENT | OPHTHALMIC | Status: AC
  Filled 2013-09-29: qty 3.5

## 2013-09-29 MED ORDER — LIDOCAINE HCL (CARDIAC) 20 MG/ML IV SOLN
INTRAVENOUS | Status: AC
  Filled 2013-09-29: qty 5

## 2013-09-29 MED ORDER — GLYCOPYRROLATE 0.2 MG/ML IJ SOLN
INTRAMUSCULAR | Status: AC
  Filled 2013-09-29: qty 3

## 2013-09-29 MED ORDER — NEOSTIGMINE METHYLSULFATE 10 MG/10ML IV SOLN
INTRAVENOUS | Status: AC
Start: 2013-09-29 — End: 2013-09-29
  Filled 2013-09-29: qty 1

## 2013-09-29 MED ORDER — HYDROCODONE-ACETAMINOPHEN 5-325 MG PO TABS: 1.0000 | ORAL_TABLET | ORAL | Status: DC | PRN

## 2013-09-29 MED ORDER — ARTIFICIAL TEARS OP OINT
TOPICAL_OINTMENT | OPHTHALMIC | Status: AC
Start: 2013-09-29 — End: 2013-09-29
  Filled 2013-09-29: qty 3.5

## 2013-09-29 MED ORDER — FENTANYL CITRATE 0.05 MG/ML IJ SOLN
25.0000 ug | INTRAMUSCULAR | Status: DC | PRN
  Administered 2013-09-29: 50 ug via INTRAVENOUS
  Administered 2013-09-29 – 2013-09-30 (×2): 25 ug via INTRAVENOUS
  Filled 2013-09-29 (×3): qty 2

## 2013-09-29 MED ORDER — LACTATED RINGERS IV SOLN
INTRAVENOUS | Status: DC | PRN
  Administered 2013-09-29: 12:00:00 via INTRAVENOUS

## 2013-09-29 MED ORDER — ONDANSETRON HCL 4 MG/2ML IJ SOLN
INTRAMUSCULAR | Status: AC
  Filled 2013-09-29: qty 2

## 2013-09-29 MED ORDER — BUPIVACAINE HCL (PF) 0.25 % IJ SOLN
INTRAMUSCULAR | Status: DC | PRN
  Administered 2013-09-29: 15 mL

## 2013-09-29 MED ORDER — FENTANYL CITRATE 0.05 MG/ML IJ SOLN
INTRAMUSCULAR | Status: AC
  Administered 2013-09-29: 25 ug via INTRAVENOUS
  Filled 2013-09-29: qty 2

## 2013-09-29 MED ORDER — ONDANSETRON HCL 4 MG/2ML IJ SOLN
INTRAMUSCULAR | Status: DC | PRN
  Administered 2013-09-29: 4 mg via INTRAVENOUS

## 2013-09-29 MED ORDER — PROPOFOL 10 MG/ML IV BOLUS
INTRAVENOUS | Status: DC | PRN
  Administered 2013-09-29: 130 mg via INTRAVENOUS

## 2013-09-29 MED ORDER — LACTATED RINGERS IV SOLN
INTRAVENOUS | Status: DC
  Administered 2013-09-29: 12:00:00 via INTRAVENOUS

## 2013-09-29 MED ORDER — ARTIFICIAL TEARS OP OINT
TOPICAL_OINTMENT | OPHTHALMIC | Status: DC | PRN
  Administered 2013-09-29: 1 via OPHTHALMIC

## 2013-09-29 MED ORDER — ROCURONIUM BROMIDE 50 MG/5ML IV SOLN
INTRAVENOUS | Status: AC
  Filled 2013-09-29: qty 1

## 2013-09-29 MED ORDER — LIDOCAINE HCL (CARDIAC) 20 MG/ML IV SOLN
INTRAVENOUS | Status: DC | PRN
  Administered 2013-09-29: 60 mg via INTRAVENOUS

## 2013-09-29 SURGICAL SUPPLY — 49 items
ADH SKN CLS APL DERMABOND .7 (GAUZE/BANDAGES/DRESSINGS) ×1
ADH SKN CLS LQ APL DERMABOND (GAUZE/BANDAGES/DRESSINGS) ×1
BLADE SURG ROTATE 9660 (MISCELLANEOUS) IMPLANT
CANISTER SUCTION 2500CC (MISCELLANEOUS) IMPLANT
CHLORAPREP W/TINT 26ML (MISCELLANEOUS) ×3 IMPLANT
COVER SURGICAL LIGHT HANDLE (MISCELLANEOUS) ×3 IMPLANT
DECANTER SPIKE VIAL GLASS SM (MISCELLANEOUS) IMPLANT
DERMABOND ADHESIVE PROPEN (GAUZE/BANDAGES/DRESSINGS) ×2
DERMABOND ADVANCED (GAUZE/BANDAGES/DRESSINGS) ×2
DERMABOND ADVANCED .7 DNX12 (GAUZE/BANDAGES/DRESSINGS) IMPLANT
DERMABOND ADVANCED .7 DNX6 (GAUZE/BANDAGES/DRESSINGS) ×1 IMPLANT
DRAIN PENROSE 1/2X12 LTX STRL (WOUND CARE) IMPLANT
DRAPE LAPAROTOMY TRNSV 102X78 (DRAPE) ×3 IMPLANT
ELECT CAUTERY BLADE 6.4 (BLADE) ×3 IMPLANT
ELECT REM PT RETURN 9FT ADLT (ELECTROSURGICAL) ×3
ELECTRODE REM PT RTRN 9FT ADLT (ELECTROSURGICAL) ×1 IMPLANT
GAUZE SPONGE 4X4 16PLY XRAY LF (GAUZE/BANDAGES/DRESSINGS) ×3 IMPLANT
GLOVE BIO SURGEON STRL SZ7 (GLOVE) ×3 IMPLANT
GLOVE BIOGEL PI IND STRL 7.5 (GLOVE) ×1 IMPLANT
GLOVE BIOGEL PI INDICATOR 7.5 (GLOVE) ×2
GOWN STRL REUS W/ TWL LRG LVL3 (GOWN DISPOSABLE) ×2 IMPLANT
GOWN STRL REUS W/TWL LRG LVL3 (GOWN DISPOSABLE) ×6
KIT BASIN OR (CUSTOM PROCEDURE TRAY) ×3 IMPLANT
KIT ROOM TURNOVER OR (KITS) ×3 IMPLANT
MESH ULTRAPRO 3X6 7.6X15CM (Mesh General) ×2 IMPLANT
NDL HYPO 25GX1X1/2 BEV (NEEDLE) ×1 IMPLANT
NEEDLE HYPO 25GX1X1/2 BEV (NEEDLE) ×3 IMPLANT
NS IRRIG 1000ML POUR BTL (IV SOLUTION) ×3 IMPLANT
PACK SURGICAL SETUP 50X90 (CUSTOM PROCEDURE TRAY) ×3 IMPLANT
PAD ARMBOARD 7.5X6 YLW CONV (MISCELLANEOUS) ×3 IMPLANT
PENCIL BUTTON HOLSTER BLD 10FT (ELECTRODE) ×3 IMPLANT
SPONGE LAP 18X18 X RAY DECT (DISPOSABLE) ×3 IMPLANT
SUT MNCRL AB 4-0 PS2 18 (SUTURE) ×3 IMPLANT
SUT VIC AB 0 CT1 18XCR BRD 8 (SUTURE) ×1 IMPLANT
SUT VIC AB 0 CT1 27 (SUTURE)
SUT VIC AB 0 CT1 27XBRD ANBCTR (SUTURE) IMPLANT
SUT VIC AB 0 CT1 8-18 (SUTURE) ×3
SUT VIC AB 2-0 CT1 27 (SUTURE) ×3
SUT VIC AB 2-0 CT1 TAPERPNT 27 (SUTURE) ×1 IMPLANT
SUT VIC AB 2-0 SH 18 (SUTURE) ×5 IMPLANT
SUT VIC AB 3-0 SH 27 (SUTURE) ×3
SUT VIC AB 3-0 SH 27XBRD (SUTURE) ×1 IMPLANT
SUT VICRYL AB 2 0 TIES (SUTURE) ×3 IMPLANT
SYR CONTROL 10ML LL (SYRINGE) ×3 IMPLANT
TOWEL OR 17X24 6PK STRL BLUE (TOWEL DISPOSABLE) ×3 IMPLANT
TOWEL OR 17X26 10 PK STRL BLUE (TOWEL DISPOSABLE) ×3 IMPLANT
TUBE CONNECTING 12'X1/4 (SUCTIONS)
TUBE CONNECTING 12X1/4 (SUCTIONS) IMPLANT
YANKAUER SUCT BULB TIP NO VENT (SUCTIONS) IMPLANT

## 2013-09-29 NOTE — Transfer of Care (Signed)
Immediate Anesthesia Transfer of Care Note  Patient: [name]  Procedure(s) Performed: Procedure(s): HERNIA REPAIR INGUINAL ADULT (Right) INSERTION OF MESH (Right)  Patient Location: PACU  Anesthesia Type:General  Level of Consciousness: awake, oriented and sedated  Airway & Oxygen Therapy: Patient Spontanous Breathing and Patient connected to nasal cannula oxygen  Post-op Assessment: Report given to PACU RN, Post -op Vital signs reviewed and stable and Patient moving all extremities X 4  Post vital signs: Reviewed and stable  Complications: No apparent anesthesia complications

## 2013-09-29 NOTE — Anesthesia Procedure Notes (Signed)
Procedure Name: LMA Insertion Date/Time: 09/29/2013 12:26 PM Performed by: Gaylene Brooks Pre-anesthesia Checklist: Patient identified, Timeout performed, Emergency Drugs available, Suction available and Patient being monitored Patient Re-evaluated:Patient Re-evaluated prior to inductionOxygen Delivery Method: Circle system utilized Preoxygenation: Pre-oxygenation with 100% oxygen Intubation Type: IV induction LMA: LMA inserted LMA Size: 4.0 Number of attempts: 1 Placement Confirmation: positive ETCO2 and breath sounds checked- equal and bilateral Tube secured with: Tape Dental Injury: Teeth and Oropharynx as per pre-operative assessment

## 2013-09-29 NOTE — Progress Notes (Signed)
  Subjective: No more right groin pain, no bulging  Objective: Vital signs in last 24 hours: Temp:  [97.5 F (36.4 C)-98.4 F (36.9 C)] 97.5 F (36.4 C) (09/14 0558) Pulse Rate:  [51-64] 56 (09/14 0558) Resp:  [16-18] 16 (09/14 0558) BP: (114-154)/(55-95) 154/86 mmHg (09/14 0558) SpO2:  [95 %-99 %] 97 % (09/14 0558) Last BM Date: 09/25/13  Intake/Output from previous day: 09/13 0701 - 09/14 0700 In: 480 [P.O.:480] Out: -  Intake/Output this shift:    General appearance: no distress GI: soft nt /nd Male genitalia: right groin with small hernia, tenderness over pubic tubercle, lih scar healed  Lab Results:   Recent Labs  09/26/13 1533 09/26/13 1541  WBC 5.4  --   HGB 14.8 15.6  HCT 45.6 46.0  PLT 167  --    BMET  Recent Labs  09/26/13 1533 09/26/13 1541 09/27/13 0720  NA 144 143 143  K 4.2 4.0 4.1  CL 107 106 106  CO2 26  --  27  GLUCOSE 86 88 86  BUN 17 19 18   CREATININE 1.33 1.40* 1.26  CALCIUM 8.6  --  8.6   PT/INR No results found for this basename: LABPROT, INR,  in the last 72 hours ABG No results found for this basename: PHART, PCO2, PO2, HCO3,  in the last 72 hours  Studies/Results: No results found.  Anti-infectives: Anti-infectives   Start     Dose/Rate Route Frequency Ordered Stop   09/29/13 0900  ceFAZolin (ANCEF) IVPB 2 g/50 mL premix    Comments:  On call to or   2 g 100 mL/hr over 30 Minutes Intravenous  Once 09/29/13 0850        Assessment/Plan: RIH  He has small RIH but I am not convinced this is source of pain. Will plan on right inguinal hernia repair as cards eval complete.  Discussed specifically this may not relieve his pain and may make it worse. Reason for surgery is to prevent incarceration as it does appear he has some symptoms.  I described the procedure in detail.   We discussed the usage of mesh and the rationale behind that.  We have elected to perform open inguinal hernia repair with mesh.  We discussed the  risks including bleeding, infection, recurrence, postoperative pain and chronic groin pain, testicular injury, urinary retention, numbness in groin and around incision.    [facility] 09/29/2013

## 2013-09-29 NOTE — Progress Notes (Signed)
Patient Name: [name] Date of Encounter: 09/29/2013  Active Problems:   Dyslpidemia-on statin   CABG '93, patent grafts 2010, Nl LVF   PVD- hx CEA,patent carotids by doppler July 2015   Right inguinal hernia   Renal insufficiency    Patient Profile: 80 y.o. male with hx CABG in '93, patent grafts 2010, preserved EF, s/p CEA with no sig carotid stenosis by Doppler 2014, AAA w/ max diameter 3.5 cm in 2014, HLD. Admitted 09/11 with a painful hernia, cards consult for preop eval, surgery planned 09/14.    SUBJECTIVE: No chest pain, no SOB, but has not been OOB much since admission  OBJECTIVE Filed Vitals:   09/28/13 0608 09/28/13 1330 09/28/13 2220 09/29/13 0558  BP: 150/92 114/55 127/95 154/86  Pulse: 67 51 64 56  Temp: 97.9 F (36.6 C) 98.1 F (36.7 C) 98.4 F (36.9 C) 97.5 F (36.4 C)  TempSrc: Oral Oral Oral   Resp: 16 18 17 16   Height:      Weight:      SpO2: 98% 95% 99% 97%    Intake/Output Summary (Last 24 hours) at 09/29/13 0946 Last data filed at 09/29/13 0946  Gross per 24 hour  Intake    480 ml  Output    200 ml  Net    280 ml   Filed Weights   09/26/13 1809  Weight: 173 lb 15.8 oz (78.92 kg)    PHYSICAL EXAM General: Well developed, well nourished, male in no acute distress. Head: Normocephalic, atraumatic.  Neck: Supple without bruits, JVD not elevated. Lungs:  Resp regular and unlabored, few rales bases. Heart: RRR, S1, S2, no S3, S4, or murmur; no rub. Abdomen: Soft, non-tender, non-distended, BS + x 4.  Extremities: No clubbing, cyanosis, no edema.  Neuro: Alert and oriented X 3. Moves all extremities spontaneously. Psych: Normal affect.  LABS: CBC: Recent Labs  09/26/13 1533 09/26/13 1541  WBC 5.4  --   NEUTROABS 3.3  --   HGB 14.8 15.6  HCT 45.6 46.0  MCV 95.8  --   PLT 167  --    Basic Metabolic Panel: Recent Labs  09/26/13 1533 09/26/13 1541 09/27/13 0720  NA 144 143 143  K 4.2 4.0 4.1  CL 107 106 106  CO2 26   --  27  GLUCOSE 86 88 86  BUN 17 19 18   CREATININE 1.33 1.40* 1.26  CALCIUM 8.6  --  8.6   Liver Function Tests: Recent Labs  09/26/13 1533  AST 25  ALT 16  ALKPHOS 77  BILITOT 0.8  PROT 6.5  ALBUMIN 3.7   TELE:   SR, Sinus brady, occasionally high 40s, no sx. PVCs and rare pairs.    ECHO: 09/28/2013  Study Conclusions - Left ventricle: The cavity size was normal. Systolic function was normal. The estimated ejection fraction was in the range of 50% to 55%. D-shaped septum consistent with elevated right ventricular pressure or volume overload. Wall motion was normal; there were no regional wall motion abnormalities. Doppler parameters are consistent with abnormal left ventricular relaxation (grade 1 diastolic dysfunction). - Right ventricle: The cavity size was mildly dilated. Wall thickness was normal. - Right atrium: The atrium was mildly dilated. - CVP 3, PAS 26   Current Medications:  . aspirin  81 mg Oral Daily  . atorvastatin  10 mg Oral QODAY  .  ceFAZolin (ANCEF) IV  2 g Intravenous Once  . enoxaparin (LOVENOX) injection  40  mg Subcutaneous Q24H  . escitalopram  20 mg Oral Daily  . gabapentin  200 mg Oral BID   . dextrose 5 % and 0.9% NaCl 75 mL/hr at 09/29/13 0029    ASSESSMENT AND PLAN: Primary problem:   Right inguinal hernia - for surgery today, per [facility], possible d/c in am if does well.    CABG '93, patent grafts 2010, EF 60% - No ongoing ischemic symptoms, volume status OK by exam, recheck weight. Echo results above, EF 50-55%, no RWMA. No further cardiac workup prior to surgery, he is at acceptable risk. Have sent message for him to get a f/u appt with Dr. Burt Knack.  Active Problems:   Dyslpidemia-on statin - continue rx    PVD- hx CEA,patent carotids by doppler July 2015 - f/u with Dr. Donnetta Hutching as scheduled    Renal insufficiency - Cr improved today, follow   Signed, Rosaria Ferries , PA-C 9:46 AM 09/29/2013 I have seen and examined the patient  along with Rosaria Ferries , PA-C.  I have reviewed the chart, notes and new data.  I agree with PA's note. PLAN: Low risk findings on echo and exam. For surgery today.  Sanda Klein, MD, [place] [phone] 09/29/2013, 11:43 AM

## 2013-09-29 NOTE — Anesthesia Preprocedure Evaluation (Signed)
Anesthesia Evaluation  Patient identified by MRN, date of birth, ID band Patient awake    Reviewed: Allergy & Precautions, H&P , NPO status , Patient's Chart, lab work & pertinent test results  Airway Mallampati: II      Dental   Pulmonary former smoker,  breath sounds clear to auscultation        Cardiovascular hypertension, + CAD and + Peripheral Vascular Disease Rhythm:Regular Rate:Normal     Neuro/Psych    GI/Hepatic Neg liver ROS, GERD-  ,  Endo/Other    Renal/GU Renal disease     Musculoskeletal  (+) Arthritis -,   Abdominal   Peds  Hematology   Anesthesia Other Findings   Reproductive/Obstetrics                           Anesthesia Physical Anesthesia Plan  ASA: III  Anesthesia Plan: General   Post-op Pain Management:    Induction: Intravenous  Airway Management Planned: LMA  Additional Equipment:   Intra-op Plan:   Post-operative Plan: Extubation in OR  Informed Consent: I have reviewed the patients History and Physical, chart, labs and discussed the procedure including the risks, benefits and alternatives for the proposed anesthesia with the patient or authorized representative who has indicated his/her understanding and acceptance.   Dental advisory given  Plan Discussed with: CRNA and Anesthesiologist  Anesthesia Plan Comments:         Anesthesia Quick Evaluation

## 2013-09-29 NOTE — Op Note (Signed)
Preoperative diagnosis: Right inguinal hernia Postoperative diagnosis: Indirect right inguinal hernia Procedure: Right inguinal hernia repair with the UltraPro mesh patch Surgeon: Dr. Serita Grammes Anesthesia: Gen. With LMA Estimated blood loss: Minimal Drains: None Complications: None Specimens: None Sponge count correct at completion Disposition to recovery stable  Indications: This is an 80 year old male with a small inguinal hernia and a fair amount of right groin pain. Some of this pain appears to be neuropathic. I am not entirely sure all of his pain is coming from his hernia. We discussed an open inguinal hernia with mesh.  Procedure: After informed consent was obtained the patient was taken to the operating room. He was given antibiotics. He is going to compression devices on his legs. He is in place until anesthesia with LMA. His right groin was prepped and draped in the standard sterile surgical fashion. A surgical timeout was then performed.  I infiltrated Marcaine throughout his right groin as well as performed an ilioinguinal nerve block. I made a right groin incision and carried this to the external oblique. This was incised sharply through the external ring. I then encircled the spermatic cord with a Penrose drain. He was noted to have an indirect hernia. I dissected the sac and reduced this in its entirety. I did close the internal ring with a 2-0 Vicryl suture. I then fashioned an UltraPro mesh patch to cover the entire floor. I sutured this with 2-0 Vicryl and the pubic tubercle and 2 positions. Inferiorly I sutured this to the shelving edge every half centimeter. I made a cut and wrapped this around the spermatic cord. This was then secured together above this with 2-0 Vicryl suture. The mesh covered the defect in the lateral portion was laid flat underneath the external oblique. Hemostasis was observed. I removed the Penrose drain. I then closed the external oblique with 2-0  Vicryl. Scarpa's was closed with 3-0 Vicryl. The skin was closed for 4-0 Monocryl and Dermabond. He tolerated this well was extubated and transferred to recovery stable.

## 2013-09-30 MED ORDER — ACETAMINOPHEN 500 MG PO TABS: 1000.0000 mg | ORAL_TABLET | Freq: Three times a day (TID) | ORAL | Status: DC

## 2013-09-30 MED ORDER — TRAMADOL HCL 50 MG PO TABS: 50.0000 mg | ORAL_TABLET | Freq: Four times a day (QID) | ORAL | Status: DC | PRN

## 2013-09-30 MED ORDER — ACETAMINOPHEN 500 MG PO TABS
1000.0000 mg | ORAL_TABLET | Freq: Three times a day (TID) | ORAL | Status: DC
  Administered 2013-09-30: 1000 mg via ORAL
  Filled 2013-09-30: qty 2

## 2013-09-30 NOTE — Progress Notes (Signed)
Possible home today

## 2013-09-30 NOTE — Evaluation (Signed)
Physical Therapy Evaluation Patient Details Name: [name] MRN: [id number] DOB: 11/06/1931 Today's Date: 09/30/2013   History of Present Illness  [name] is a 80 year old male with a history of CAD s/p CABG, carotid stenosis s/p CEA, BPH, HLD and 3.5cm AAA who presents with right groin pain.  Pt s/p Right inguinal hernia repair with the UltraPro mesh patch  9/14.  Clinical Impression  Pt extremely HOH limiting education carry over on how to minimize pain during transfers and walker management. However spouse and dtr also educated with good carryover. Pt did tolerate amb well this date. Pt safe to d/c home with use of RW and 24/7 assist once medically stable.    Follow Up Recommendations No PT follow up;Supervision/Assistance - 24 hour    Equipment Recommendations  None recommended by PT (pt has RW)    Recommendations for Other Services       Precautions / Restrictions Precautions Precautions: Fall Restrictions Weight Bearing Restrictions: No      Mobility  Bed Mobility Overal bed mobility: Needs Assistance Bed Mobility: Rolling;Sidelying to Sit;Sit to Sidelying Rolling: Supervision Sidelying to sit: Min assist     Sit to sidelying: Min assist General bed mobility comments: max tactile and verbal directional cues for log roll to help minimalize pain during transfer in/out of bed  Transfers Overall transfer level: Needs assistance Equipment used: Rolling walker (2 wheeled) Transfers: Sit to/from Stand Sit to Stand: Min guard         General transfer comment: v/c's for safe hand placement. pt with uncontrolled descent resulting in increased pain. educated on importance of reaching back for object he is sitting on  Ambulation/Gait Ambulation/Gait assistance: Min assist Ambulation Distance (Feet): 120 Feet Assistive device: Rolling walker (2 wheeled) Gait Pattern/deviations: Step-through pattern;Decreased stride length;Trunk flexed Gait velocity: slow Gait  velocity interpretation: Below normal speed for age/gender General Gait Details: v/c's for safe walker management/stay inside walker and not push out to far in front.  Stairs            Wheelchair Mobility    Modified Rankin (Stroke Patients Only)       Balance Overall balance assessment: Needs assistance Sitting-balance support: Feet supported;Single extremity supported Sitting balance-Leahy Scale: Poor Sitting balance - Comments: pt needs to brace self with unilateral support due to pain   Standing balance support: Bilateral upper extremity supported Standing balance-Leahy Scale: Poor Standing balance comment: pt requires support/physical assist to maintain standing due to surgical pain                             Pertinent Vitals/Pain Pain Assessment: 0-10 Pain Score: 8  Pain Location: surgical incision Pain Intervention(s): Patient requesting pain meds-RN notified    Home Living Family/patient expects to be discharged to:: Private residence Living Arrangements: Spouse/significant other Available Help at Discharge: Family;Available 24 hours/day Type of Home: House Home Access: Level entry     Home Layout: One level Home Equipment: Walker - 2 wheels;Cane - single point      Prior Function Level of Independence: Independent               Hand Dominance   Dominant Hand: Right    Extremity/Trunk Assessment   Upper Extremity Assessment: Overall WFL for tasks assessed           Lower Extremity Assessment: Overall WFL for tasks assessed      Cervical / Trunk Assessment: Kyphotic  Communication   Communication: HOH (extremely HOH)  Cognition Arousal/Alertness: Awake/alert (but sleepy from pain medication) Behavior During Therapy: WFL for tasks assessed/performed Overall Cognitive Status: Within Functional Limits for tasks assessed                      General Comments General comments (skin integrity, edema, etc.): pt  extremely HOH. educated family on how to assist pt in/out of bed and in/out of car.    Exercises        Assessment/Plan    PT Assessment Patient needs continued PT services  PT Diagnosis Difficulty walking;Acute pain   PT Problem List Decreased strength;Decreased activity tolerance;Decreased balance;Decreased mobility  PT Treatment Interventions DME instruction;Gait training;Functional mobility training;Therapeutic activities;Therapeutic exercise   PT Goals (Current goals can be found in the Care Plan section) Acute Rehab PT Goals Patient Stated Goal: home PT Goal Formulation: With patient/family Time For Goal Achievement: 10/07/13 Potential to Achieve Goals: Good    Frequency Min 3X/week   Barriers to discharge        Co-evaluation               End of Session Equipment Utilized During Treatment: Gait belt Activity Tolerance: Patient limited by pain Patient left: in chair;with family/visitor present;with call bell/phone within reach Nurse Communication: Mobility status         Time: 2458-0998 PT Time Calculation (min): 25 min   Charges:   PT Evaluation $Initial PT Evaluation Tier I: 1 Procedure PT Treatments $Gait Training: 8-22 mins   PT G CodesKingsley Callander 09/30/2013, 4:42 PM  Kittie Plater, PT, DPT Pager #: [phone] Office #: [phone]

## 2013-09-30 NOTE — Discharge Instructions (Signed)
[facility] _______[facility]  UMBILICAL OR INGUINAL HERNIA REPAIR: POST OP INSTRUCTIONS  Always review your discharge instruction sheet given to you by the facility where your surgery was performed. IF YOU HAVE DISABILITY OR FAMILY LEAVE FORMS, YOU MUST BRING THEM TO THE OFFICE FOR PROCESSING.   DO NOT GIVE THEM TO YOUR DOCTOR.  1. A  prescription for pain medication may be given to you upon discharge.  Take your pain medication as prescribed, if needed.  If narcotic pain medicine is not needed, then you may take acetaminophen (Tylenol) or ibuprofen (Advil) as needed. 2. Take your usually prescribed medications unless otherwise directed. 3. If you need a refill on your pain medication, please contact your pharmacy.  They will contact our office to request authorization. Prescriptions will not be filled after 5 pm or on week-ends. 4. You should follow a light diet the first 24 hours after arrival home, such as soup and crackers, etc.  Be sure to include lots of fluids daily.  Resume your normal diet the day after surgery. 5. Most patients will experience some swelling and bruising around the umbilicus or in the groin and scrotum.  Ice packs and reclining will help.  Swelling and bruising can take several days to resolve.  6. It is common to experience some constipation if taking pain medication after surgery.  Increasing fluid intake and taking a stool softener (such as Colace) will usually help or prevent this problem from occurring.  A mild laxative (Milk of Magnesia or Miralax) should be taken according to package directions if there are no bowel movements after 48 hours. 7. Unless discharge instructions indicate otherwise, you may remove your bandages 24-48 hours after surgery, and you may shower at that time.  You may have steri-strips (small skin tapes) in place directly over the incision.  These strips should be left on the skin for 7-10 days.  If your surgeon used skin glue on the  incision, you may shower in 24 hours.  The glue will flake off over the next 2-3 weeks.  Any sutures or staples will be removed at the office during your follow-up visit. 8. ACTIVITIES:  You may resume regular (light) daily activities beginning the next day--such as daily self-care, walking, climbing stairs--gradually increasing activities as tolerated.  You may have sexual intercourse when it is comfortable.  Refrain from any heavy lifting or straining until approved by your doctor. a. You may drive when you are no longer taking prescription pain medication, you can comfortably wear a seatbelt, and you can safely maneuver your car and apply brakes. b. RETURN TO WORK:  __________________________________________________________ 9. You should see your doctor in the office for a follow-up appointment approximately 2-3 weeks after your surgery.  Make sure that you call for this appointment within a day or two after you arrive home to insure a convenient appointment time. 10. OTHER INSTRUCTIONS:  __________________________________________________________________________________________________________________________________________________________________________________________  WHEN TO CALL YOUR DOCTOR: 1. Fever over 101.0 2. Inability to urinate 3. Nausea and/or vomiting 4. Extreme swelling or bruising 5. Continued bleeding from incision. 6. Increased pain, redness, or drainage from the incision  The clinic staff is available to answer your questions during regular business hours.  Please don't hesitate to call and ask to speak to one of the nurses for clinical concerns.  If you have a medical emergency, go to the nearest emergency room or call 911.  A surgeon from [facility] is always on call at the hospital     [address], [address], [place], [place]  [address] ?  [address] [address], [place], [place]   [address] [phone] ? [phone] ? FAX [phone] Web site:  [email or web address]  

## 2013-09-30 NOTE — Progress Notes (Signed)
D/c to home with wife and daughter. VSS. Breathing regular and unlabored with steady gait. PT teaching complete and patient demonstrated understanding. D/c instructions and Rx's given and understood by patient and family. Given Tramadol at d/c for pain.

## 2013-09-30 NOTE — Discharge Summary (Signed)
Physician Discharge Summary  [name] OVF:[id number] DOB: 04-14-1931 DOA: 09/26/2013  PCP: Georgetta Haber, MD  Consultation: cardiology---Dr. Sanda Klein  Admit date: 09/26/2013 Discharge date: 09/30/2013  Recommendations for Outpatient Follow-up:   Follow-up Information   Follow up with [facility], MD On 10/21/2013. (arrive by 8:45AM for a 9AM post operative check up)    Specialty:  General Surgery   Contact information:   [address] [place] [address] [phone]       Schedule an appointment as soon as possible for a visit with Georgetta Haber, MD. (to follow up on liver cysts found on the CT)    Specialty:  Internal Medicine   Contact information:   [place] [address] [phone]      Discharge Diagnoses:  1. Right inguinal hernia  2. Mild AKI 3. CAD 4. HTN 5. Cystic hepatic lesions    Surgical Procedure: right inguinal hernia repair with insertion of mesh---Dr. Donne Hazel 09/29/13   Discharge Condition: stable Disposition: home  Diet recommendation: heart healthy   Filed Weights   09/26/13 1809 09/29/13 0956  Weight: 173 lb 15.8 oz (78.92 kg) 168 lb 4.8 oz (76.34 kg)    Filed Vitals:   09/30/13 1402  BP: 80/46  Pulse: 72  Temp: 98.2 F (36.8 C)  Resp: 16    Hospital Course:  [name] is a 80 year old male with a history of CAD s/p CABG, carotid stenosis s/p CEA, BPH, HLD and 3.5cm AAA who presented to [facility] with right groin pain.  He was seen by Dr. Marlou Starks approximately 2 weeks prior to this visit for the inguinal hernia and was supposed to be seen by cardiology for clearance.  His work up showed a non incarcerated hernia.  The CT also revealed cystic hepatic lesions.  I discussed this with the patient and his wife.  He will follow up with PCP to consider imaging with [place] or an MRI.  Due to pain, he was admitted.  Cardiology was called for clearance.  On HD#3 he underwent a laparoscopic hernia  repair.  He tolerated the procedure well and was transferred to the floor.  He ran a 1x temp, did not have any further symptoms.  He was mobilized with PT, diet was advanced, pain treated with tylenol and tramdol due to allergies and advanced age.  On POD#1 he was felt stable for discharge home with his wife.  Follow up has been arranged.  We discussed warning signs that warrant immediate attention. Medication risks, benefits and therapeutic alternatives were reviewed with the patient.  He verbalizes understanding. He was encouraged to call with questions or concerns.     Discharge Instructions     Medication List         acetaminophen 500 MG tablet  Commonly known as:  TYLENOL  Take 2 tablets (1,000 mg total) by mouth 3 (three) times daily.     ALPHAGAN P 0.1 % Soln  Generic drug:  brimonidine  Place 1 drop into both eyes daily.     aspirin EC 81 MG tablet  Take 81 mg by mouth 3 (three) times a week.     escitalopram 20 MG tablet  Commonly known as:  LEXAPRO  Take 1 tablet (20 mg total) by mouth daily.     gabapentin 100 MG capsule  Commonly known as:  NEURONTIN  Take 200 mg by mouth 2 (two) times daily.     rosuvastatin 20 MG tablet  Commonly known as:  CRESTOR  Take 10 mg by mouth every other day.     traMADol 50 MG tablet  Commonly known as:  ULTRAM  Take 1 tablet (50 mg total) by mouth every 6 (six) hours as needed for moderate pain or severe pain.     VITAMIN B COMPLEX PO  Take 50 mg by mouth every other day.     vitamin E 400 UNIT capsule  Generic drug:  vitamin E  Take 400 Units by mouth every other day.           Follow-up Information   Follow up with [facility], MD On 10/21/2013. (arrive by 8:45AM for a 9AM post operative check up)    Specialty:  General Surgery   Contact information:   [place] [address] [phone]        The results of significant diagnostics from this hospitalization (including imaging,  microbiology, ancillary and laboratory) are listed below for reference.    Significant Diagnostic Studies: Ct Abdomen Pelvis W Contrast  09/26/2013   CLINICAL DATA:  80 year old male. History of right groin pain. Patient has a history of abdominal aortic aneurysm. Prior cholecystectomy, prostate surgery, back surgery, renal stone surgery.  EXAM: CT ABDOMEN AND PELVIS WITH CONTRAST  TECHNIQUE: Multidetector CT imaging of the abdomen and pelvis was performed using the standard protocol following bolus administration of intravenous contrast.  CONTRAST:  113mL OMNIPAQUE IOHEXOL 300 MG/ML  SOLN  COMPARISON:  Abdomen CT 06/19/2009  FINDINGS: Chest:  Unremarkable appearance of the subcutaneous tissues of the lower chest.  Heart size appears within normal limits. No pericardial fluid/ thickening. Calcifications/stent in distribution the right coronary artery and left anterior descending coronary arteries.  No confluent airspace disease. Atelectasis at the bilateral bases. No pleural effusion.  Surgical changes of prior median sternotomy.  Abdomen:  Similar number and distribution of low-density cystic structures within the liver parenchyma, however, there has been interval growth of several. Largest within the left liver lobe involving segments 2 and 3 measured 5.5 cm on prior currently measuring 5.9 cm. Within the right liver lobe, segment 6, lesion measured 4.2 cm now measuring 5.3 cm. There are multiple additional lesions throughout the liver. All of the measurable lesions measure fluid density without appreciable wall or nodular enhancement. The remainder of the liver parenchyma appears to be uniform in attenuation/enhancement.  No intrahepatic ductal dilatation.  Surgical change of cholecystectomy.  Mild dilation of the common bile duct, which is unchanged from the comparison. No radiopaque gallstone within the visualized common bile duct.  Unremarkable appearance of the spleen.  Unremarkable appearance the  bilateral adrenal glands.  No evidence of hydronephrosis. Unremarkable appearance of the bilateral ureters.  Enteric contrast extends to the transverse colon. No abnormally distended small bowel or colon. No mesenteric inflammatory changes or fluid.  Mild amount of diverticular disease. No associated inflammatory changes.  No peripancreatic fluid or inflammatory changes. Uniform enhancement/ attenuation pancreatic parenchyma.  Unremarkable appearance of the urinary bladder.  Multiple coarse clumped calcifications of the prostate which measures 4.4 cm in transverse diameter.  Asymmetric size of the seminal vesicles, right greater than left. No associated an inflammatory changes.  Bilateral fact containing inguinal hernia.  Musculoskeletal:  No displaced acute fracture identified. Multilevel degenerative changes of the visualized spine. Multilevel disc disease with vacuum disc phenomenon present throughout the lumbar spine. No significant bony canal stenosis, although there are multiple disc bulges present, most pronounced at L3-S1 with associated neural  foraminal narrowing secondary to facet disease.  Vascular:  Atherosclerotic calcifications of the lower thoracic aorta and abdominal aorta. Calcifications involve the origin of mesenteric vessels, including celiac artery, superior mesenteric artery, left greater than right renal artery is, in the inferior mesenteric artery.  Greatest diameter of the infrarenal abdominal aorta measures 32 mm which is not significantly greater than the comparison CT dated 06/19/2009. No dissection flap or periaortic fluid.  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  Calcifications extend to the bilateral iliofemoral system.  IMPRESSION: No acute CT finding to account for the patient's abdominal pain.  Multiple low-density liver cystic lesions are again identified, similar in number and distribution though some have enlarged. These are favored to  represent benign cysts given the imaging appearance, however, if further evaluation were warranted, ultrasound or MRI could be considered.  Changes of atherosclerosis/ coronary artery disease with associated ectasia of the infrarenal abdominal aorta. Appearance is unchanged from the comparison CT of 2011, including the greatest diameter of the abdominal aorta, which measures approximately 3.2 cm.  Multilevel all disc degeneration and facet disease of the lumbar spine.  Signed,  Dulcy Fanny. Earleen Newport, DO  [facility]  [facility]   Electronically Signed   By: Corrie Mckusick O.D.   On: 09/26/2013 17:43    Microbiology: Recent Results (from the past 240 hour(s))  SURGICAL PCR SCREEN     Status: None   Collection Time    09/28/13  4:58 PM      Result Value Ref Range Status   MRSA, PCR NEGATIVE  NEGATIVE Final   Staphylococcus aureus NEGATIVE  NEGATIVE Final   Comment:            The Xpert SA Assay (FDA     approved for NASAL specimens     in patients over 63 years of age),     is one component of     a comprehensive surveillance     program.  Test performance has     been validated by [employer] for patients greater     than or equal to 65 year old.     It is not intended     to diagnose infection nor to     guide or monitor treatment.     Labs: Basic Metabolic Panel:  Recent Labs Lab 09/26/13 1533 09/26/13 1541 09/27/13 0720  NA 144 143 143  K 4.2 4.0 4.1  CL 107 106 106  CO2 26  --  27  GLUCOSE 86 88 86  BUN 17 19 18   CREATININE 1.33 1.40* 1.26  CALCIUM 8.6  --  8.6   Liver Function Tests:  Recent Labs Lab 09/26/13 1533  AST 25  ALT 16  ALKPHOS 77  BILITOT 0.8  PROT 6.5  ALBUMIN 3.7   No results found for this basename: LIPASE, AMYLASE,  in the last 168 hours No results found for this basename: AMMONIA,  in the last 168 hours CBC:  Recent Labs Lab 09/26/13 1533 09/26/13 1541  WBC 5.4  --   NEUTROABS 3.3  --    HGB 14.8 15.6  HCT 45.6 46.0  MCV 95.8  --   PLT 167  --    Cardiac Enzymes: No results found for this basename: CKTOTAL, CKMB, CKMBINDEX, TROPONINI,  in the last 168 hours BNP: BNP (last 3 results) No results found for this basename: PROBNP,  in the last 8760  hours CBG: No results found for this basename: GLUCAP,  in the last 168 hours  Principal Problem:   Right inguinal hernia Active Problems:   Dyslpidemia-on statin   CABG '93, patent grafts 2010, Nl LVF   PVD- hx CEA,patent carotids by doppler July 2015   Renal insufficiency   Time coordinating discharge: <30 mins  Signed:  Lavida Patch, ANP-BC

## 2013-09-30 NOTE — Progress Notes (Signed)
Patient ID: [name], male   DOB: 04/24/1931, 80 y.o.   MRN: [id number]     Welcome      [address] [place], [place] [address]    Phone: [phone] FAX: [phone]     Subjective: Has not had PO.  Voiding.  No flatus yet.  No n/v.  VSS.  Febrile 102 last night, none since.  Using IS when able to remember(1567ml).  Still c/o burning pain and soreness to right groin.  Objective:  Vital signs:  Filed Vitals:   09/29/13 2015 09/30/13 0135 09/30/13 0250 09/30/13 0501  BP: 160/80 144/77  101/63  Pulse: 90 86  85  Temp: 99.9 F (37.7 C) 102.7 F (39.3 C) 99.8 F (37.7 C) 98.2 F (36.8 C)  TempSrc: Oral Oral Oral Oral  Resp: 16 19  18   Height:      Weight:      SpO2: 95% 95%  94%    Last BM Date: 09/25/13  Intake/Output   Yesterday:  09/14 0701 - 09/15 0700 In: 1260 [P.O.:360; I.V.:900] Out: 400 [Urine:400] This shift:    I/O last 3 completed shifts: In: 1260 [P.O.:360; I.V.:900] Out: 400 [Urine:400]    Physical Exam: General: Pt awake/alert/oriented x4 in no acute distress Chest: CTA. No chest wall pain w good excursion CV:  Pulses intact.  Regular rhythm Abdomen: Soft.  Nondistended.  TTP RLQ, ecchymosis and swelling noted, incision is c/d/i.  No evidence of peritonitis.  No incarcerated hernias. Ext:  SCDs BLE.  No mjr edema.  No cyanosis Skin: No petechiae / purpura   Problem List:   Principal Problem:   Right inguinal hernia Active Problems:   Dyslpidemia-on statin   CABG '93, patent grafts 2010, Nl LVF   PVD- hx CEA,patent carotids by doppler July 2015   Renal insufficiency    Results:   Labs: Results for orders placed during the hospital encounter of 09/26/13 (from the past 48 hour(s))  SURGICAL PCR SCREEN     Status: None   Collection Time    09/28/13  4:58 PM      Result Value Ref Range   MRSA, PCR NEGATIVE  NEGATIVE   Staphylococcus aureus NEGATIVE  NEGATIVE   Comment:          The Xpert SA Assay (FDA     approved for NASAL specimens     in patients over 78 years of age),     is one component of     a comprehensive surveillance     program.  Test performance has     been validated by [employer] for patients greater     than or equal to 51 year old.     It is not intended     to diagnose infection nor to     guide or monitor treatment.    Imaging / Studies: No results found.  Medications / Allergies:  Scheduled Meds: . acetaminophen  1,000 mg Oral TID  . aspirin  81 mg Oral Daily  . atorvastatin  10 mg Oral QODAY  . enoxaparin (LOVENOX) injection  40 mg Subcutaneous Q24H  . escitalopram  20 mg Oral Daily  . gabapentin  200 mg Oral BID   Continuous Infusions:  PRN Meds:.fentaNYL, hydrALAZINE, ondansetron, traMADol  Antibiotics: Anti-infectives   Start     Dose/Rate Route Frequency Ordered Stop   09/29/13 0930  [MAR Hold]  ceFAZolin (ANCEF) IVPB  2 g/50 mL premix     (On MAR Hold since 09/29/13 1135)  Comments:  On call to or   2 g 100 mL/hr over 30 Minutes Intravenous  Once 09/29/13 0850 09/29/13 1235        Assessment/Plan POD#1 right inguinal hernia repair with insertion of mesh---Dr. Donne Hazel -advance diet -mobilize -IS -SCD/lovenox -SLIV -add scheduled tylenol, PRN tramadol Mild AKI  -resolved  Hx CAD/CABG  -stable, cards following HTN  -hydralazine PRN -needs to see PCP for BP management  Cystic hepatic lesions-likely benign cyst, may consider further outpatient work up with MRI or [place]   dispo-anticipate DC later today if able to tolerate PO and pain is better controlled.  Erby Pian, [facility] Pager [phone]) For consults and floor pages call [phone](7A-4:30P)  09/30/2013 10:14 AM

## 2013-10-01 ENCOUNTER — Emergency Department (HOSPITAL_COMMUNITY): Payer: Medicare Other

## 2013-10-01 ENCOUNTER — Encounter (HOSPITAL_COMMUNITY): Payer: Self-pay | Admitting: Emergency Medicine

## 2013-10-01 ENCOUNTER — Observation Stay (HOSPITAL_COMMUNITY)
Admission: EM | Admit: 2013-10-01 | Discharge: 2013-10-02 | Disposition: A | Discharge status: Skilled Nursing Facility | Payer: Medicare Other | Attending: General Surgery | Admitting: General Surgery

## 2013-10-01 DIAGNOSIS — H5440 Blindness, one eye, unspecified eye: Secondary | ICD-10-CM | POA: Insufficient documentation

## 2013-10-01 DIAGNOSIS — Y838 Other surgical procedures as the cause of abnormal reaction of the patient, or of later complication, without mention of misadventure at the time of the procedure: Secondary | ICD-10-CM | POA: Diagnosis not present

## 2013-10-01 DIAGNOSIS — M47812 Spondylosis without myelopathy or radiculopathy, cervical region: Secondary | ICD-10-CM | POA: Diagnosis not present

## 2013-10-01 DIAGNOSIS — I6529 Occlusion and stenosis of unspecified carotid artery: Secondary | ICD-10-CM | POA: Insufficient documentation

## 2013-10-01 DIAGNOSIS — T8140XA Infection following a procedure, unspecified, initial encounter: Secondary | ICD-10-CM | POA: Diagnosis not present

## 2013-10-01 DIAGNOSIS — K219 Gastro-esophageal reflux disease without esophagitis: Secondary | ICD-10-CM | POA: Insufficient documentation

## 2013-10-01 DIAGNOSIS — R079 Chest pain, unspecified: Secondary | ICD-10-CM | POA: Diagnosis not present

## 2013-10-01 DIAGNOSIS — M25559 Pain in unspecified hip: Secondary | ICD-10-CM | POA: Diagnosis not present

## 2013-10-01 DIAGNOSIS — Z881 Allergy status to other antibiotic agents status: Secondary | ICD-10-CM | POA: Insufficient documentation

## 2013-10-01 DIAGNOSIS — N400 Enlarged prostate without lower urinary tract symptoms: Secondary | ICD-10-CM | POA: Diagnosis not present

## 2013-10-01 DIAGNOSIS — I2510 Atherosclerotic heart disease of native coronary artery without angina pectoris: Secondary | ICD-10-CM | POA: Diagnosis not present

## 2013-10-01 DIAGNOSIS — I714 Abdominal aortic aneurysm, without rupture, unspecified: Secondary | ICD-10-CM | POA: Diagnosis not present

## 2013-10-01 DIAGNOSIS — E785 Hyperlipidemia, unspecified: Secondary | ICD-10-CM | POA: Insufficient documentation

## 2013-10-01 DIAGNOSIS — Z7982 Long term (current) use of aspirin: Secondary | ICD-10-CM | POA: Insufficient documentation

## 2013-10-01 DIAGNOSIS — H9190 Unspecified hearing loss, unspecified ear: Secondary | ICD-10-CM | POA: Diagnosis not present

## 2013-10-01 DIAGNOSIS — Z79899 Other long term (current) drug therapy: Secondary | ICD-10-CM | POA: Insufficient documentation

## 2013-10-01 DIAGNOSIS — Z888 Allergy status to other drugs, medicaments and biological substances status: Secondary | ICD-10-CM | POA: Diagnosis not present

## 2013-10-01 DIAGNOSIS — IMO0002 Reserved for concepts with insufficient information to code with codable children: Secondary | ICD-10-CM | POA: Diagnosis not present

## 2013-10-01 DIAGNOSIS — I69998 Other sequelae following unspecified cerebrovascular disease: Secondary | ICD-10-CM | POA: Diagnosis not present

## 2013-10-01 DIAGNOSIS — Z885 Allergy status to narcotic agent status: Secondary | ICD-10-CM | POA: Insufficient documentation

## 2013-10-01 DIAGNOSIS — G8929 Other chronic pain: Secondary | ICD-10-CM | POA: Insufficient documentation

## 2013-10-01 DIAGNOSIS — Z951 Presence of aortocoronary bypass graft: Secondary | ICD-10-CM | POA: Diagnosis not present

## 2013-10-01 DIAGNOSIS — R9431 Abnormal electrocardiogram [ECG] [EKG]: Secondary | ICD-10-CM | POA: Diagnosis not present

## 2013-10-01 LAB — CBC WITH DIFFERENTIAL/PLATELET
BASOS ABS: 0 10*3/uL (ref 0.0–0.1)
Basophils Relative: 0 % (ref 0–1)
Eosinophils Absolute: 0 10*3/uL (ref 0.0–0.7)
Eosinophils Relative: 0 % (ref 0–5)
HCT: 42 % (ref 39.0–52.0)
Hemoglobin: 14.1 g/dL (ref 13.0–17.0)
Lymphocytes Relative: 12 % (ref 12–46)
Lymphs Abs: 1 10*3/uL (ref 0.7–4.0)
MCH: 31 pg (ref 26.0–34.0)
MCHC: 33.6 g/dL (ref 30.0–36.0)
MCV: 92.3 fL (ref 78.0–100.0)
Monocytes Absolute: 1.5 10*3/uL — ABNORMAL HIGH (ref 0.1–1.0)
Monocytes Relative: 18 % — ABNORMAL HIGH (ref 3–12)
NEUTROS PCT: 70 % (ref 43–77)
Neutro Abs: 5.8 10*3/uL (ref 1.7–7.7)
PLATELETS: 129 10*3/uL — AB (ref 150–400)
RBC: 4.55 MIL/uL (ref 4.22–5.81)
RDW: 14.2 % (ref 11.5–15.5)
WBC: 8.3 10*3/uL (ref 4.0–10.5)

## 2013-10-01 LAB — COMPREHENSIVE METABOLIC PANEL
ALBUMIN: 3.2 g/dL — AB (ref 3.5–5.2)
ALK PHOS: 67 U/L (ref 39–117)
ALT: 17 U/L (ref 0–53)
ANION GAP: 10 (ref 5–15)
AST: 30 U/L (ref 0–37)
BUN: 26 mg/dL — ABNORMAL HIGH (ref 6–23)
CO2: 26 mEq/L (ref 19–32)
Calcium: 8.3 mg/dL — ABNORMAL LOW (ref 8.4–10.5)
Chloride: 100 mEq/L (ref 96–112)
Creatinine, Ser: 1.52 mg/dL — ABNORMAL HIGH (ref 0.50–1.35)
GFR calc Af Amer: 48 mL/min — ABNORMAL LOW (ref >90)
GFR calc non Af Amer: 42 mL/min — ABNORMAL LOW (ref >90)
Glucose, Bld: 98 mg/dL (ref 70–99)
Potassium: 3.8 mEq/L (ref 3.7–5.3)
SODIUM: 136 meq/L — AB (ref 137–147)
TOTAL PROTEIN: 6.2 g/dL (ref 6.0–8.3)
Total Bilirubin: 1 mg/dL (ref 0.3–1.2)

## 2013-10-01 LAB — URINALYSIS, ROUTINE W REFLEX MICROSCOPIC
BILIRUBIN URINE: NEGATIVE
Glucose, UA: NEGATIVE mg/dL
HGB URINE DIPSTICK: NEGATIVE
Ketones, ur: NEGATIVE mg/dL
Leukocytes, UA: NEGATIVE
NITRITE: NEGATIVE
Protein, ur: 30 mg/dL — AB
SPECIFIC GRAVITY, URINE: 1.024 (ref 1.005–1.030)
Urobilinogen, UA: 0.2 mg/dL (ref 0.0–1.0)
pH: 5.5 (ref 5.0–8.0)

## 2013-10-01 LAB — URINE MICROSCOPIC-ADD ON

## 2013-10-01 MED ORDER — TRAMADOL HCL 50 MG PO TABS
50.0000 mg | ORAL_TABLET | Freq: Four times a day (QID) | ORAL | Status: DC | PRN
  Administered 2013-10-01: 50 mg via ORAL
  Filled 2013-10-01: qty 1

## 2013-10-01 MED ORDER — SODIUM CHLORIDE 0.9 % IV BOLUS (SEPSIS)
500.0000 mL | Freq: Once | INTRAVENOUS | Status: AC
  Administered 2013-10-01: 500 mL via INTRAVENOUS

## 2013-10-01 MED ORDER — ONDANSETRON HCL 4 MG/2ML IJ SOLN: 4.0000 mg | Freq: Four times a day (QID) | INTRAMUSCULAR | Status: DC | PRN

## 2013-10-01 MED ORDER — FENTANYL CITRATE 0.05 MG/ML IJ SOLN: 50.0000 ug | INTRAMUSCULAR | Status: DC | PRN

## 2013-10-01 MED ORDER — ACETAMINOPHEN 500 MG PO TABS: 1000.0000 mg | ORAL_TABLET | Freq: Every day | ORAL | Status: DC | PRN

## 2013-10-01 MED ORDER — CEFAZOLIN SODIUM 1-5 GM-% IV SOLN
1.0000 g | INTRAVENOUS | Status: DC
  Filled 2013-10-01: qty 50

## 2013-10-01 MED ORDER — SODIUM CHLORIDE 0.9 % IV SOLN
INTRAVENOUS | Status: DC
  Administered 2013-10-01: 22:00:00 via INTRAVENOUS

## 2013-10-01 MED ORDER — VANCOMYCIN HCL IN DEXTROSE 1-5 GM/200ML-% IV SOLN
1000.0000 mg | Freq: Once | INTRAVENOUS | Status: AC
  Administered 2013-10-01: 1000 mg via INTRAVENOUS
  Filled 2013-10-01: qty 200

## 2013-10-01 MED ORDER — ESCITALOPRAM OXALATE 20 MG PO TABS
20.0000 mg | ORAL_TABLET | Freq: Every day | ORAL | Status: DC
  Administered 2013-10-02: 20 mg via ORAL
  Filled 2013-10-01: qty 1

## 2013-10-01 MED ORDER — GABAPENTIN 100 MG PO CAPS
200.0000 mg | ORAL_CAPSULE | Freq: Two times a day (BID) | ORAL | Status: DC
  Administered 2013-10-01 – 2013-10-02 (×2): 200 mg via ORAL
  Filled 2013-10-01 (×3): qty 2

## 2013-10-01 MED ORDER — IOHEXOL 300 MG/ML  SOLN
100.0000 mL | Freq: Once | INTRAMUSCULAR | Status: AC | PRN
  Administered 2013-10-01: 100 mL via INTRAVENOUS

## 2013-10-01 MED ORDER — SODIUM CHLORIDE 0.9 % IV SOLN
Freq: Once | INTRAVENOUS | Status: AC
  Administered 2013-10-01: 14:00:00 via INTRAVENOUS

## 2013-10-01 MED ORDER — BRIMONIDINE TARTRATE 0.2 % OP SOLN
1.0000 [drp] | Freq: Three times a day (TID) | OPHTHALMIC | Status: DC
  Administered 2013-10-01: 1 [drp] via OPHTHALMIC
  Filled 2013-10-01: qty 5

## 2013-10-01 NOTE — ED Notes (Signed)
Pt just was discharged from the hospital. He had abdominal hernia repaired. He had breakfast this morning and was unable to get up. He arrives to the ED due to wife unable to get him up. He is alert to name, and place. He knows the president. He states his pain is 6/10. He does not remember eating breakfast. Wife states she thinks he is having a reaction  To anesthesia. Prior to surgery pt repeatedly asked the same questions and had a problem with short term memory. He appears well. VSS. Wife states she had atrial fib and is concerned because she cannot take care of him. Bowel sounds present x 4 quadrants. Had a BM on Sunday and his wife gave him miralax today to induce a BM

## 2013-10-01 NOTE — H&P (Signed)
[name] is an 80 y.o. male.   Chief Complaint: Altered mental status; severe right groin pain HPI: 80 yo male with multiple medical issues presents after right inguinal hernia repair with mesh on 09/29/13.  He had been hospitalized over the weekend for cardiac clearance.  During this time, he had received Lovenox.  He was discharged about 24 hours ago.  He has developed significant swelling and tenderness in his right groin.  He also has had some confusion, as well as incontinence of urine.  He was brought back to the ED tonight for evaluation.  Past Medical History  Diagnosis Date  . Carotid stenosis     s/p Left CEA, followed by Dr. Donnetta Hutching  . Abdominal aortic aneurysm     [place] 07/2012 -3.5 cm max diameter, no change; followed by Dr. Donnetta Hutching  . Radicular low back pain   . BPH (benign prostatic hypertrophy)   . Aphthous ulcer   . Unspecified adverse effect of unspecified drug, medicinal and biological substance   . GERD (gastroesophageal reflux disease)   . History of nephrolithiasis   . Hyperlipidemia   . CAD (coronary artery disease)     s/p CABG 1993;  cath 10/10: severe native 3v CAD, S-Dx ok, S-OM1/OM2 ok, S-PDA ok, L-LAD ok, EF 60%  . Pre-syncope   . 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 HOH (hard of hearing)     hearing aids  . Memory deficits 06/19/2013  . Throat cancer   . Cervical spondylosis without myelopathy     Past Surgical History  Procedure Laterality Date  . Coronary artery bypass graft    . Coronary artery bypass graft  1998  . Cholecystectomy    . Prostate surgery    . Back surgery    . Renal stone surgery      3 times  . Throat surgery    . Cardiac catheterization    . Carotid endarterectomy Left   . Hernia repair      unclear which side    Family History  Problem Relation Age of Onset  . Coronary artery disease    . Heart disease Father   . Stroke Father   . Stroke Brother   . Coronary artery disease Brother   . Heart disease  Brother    Social History:  reports that he quit smoking about 7 years ago. He has never used smokeless tobacco. He reports that he does not drink alcohol or use illicit drugs.  Allergies:  Allergies  Allergen Reactions  . Avinza [Morphine Sulfate] Other (See Comments)    Altered mental status. "about killed him"  . Doxycycline Calcium Other (See Comments)    Causes patient chronic colitis; was hospitalized two weeks due to med.  . Vibramycin [Doxycycline Hyclate] Other (See Comments)    Causes patient colitis; was hospitalized two weeks due to med  . Statins Other (See Comments)    Muscle pain; can tolerate crestor 10 mg every other day  . Codeine Sulfate Nausea And Vomiting  . Oxycodone Other (See Comments)    Angry; altered mental status; "wild and crazy"    Prior to Admission medications   Medication Sig Start Date End Date Taking? Authorizing Provider  acetaminophen (TYLENOL) 500 MG tablet Take 1,000 mg by mouth daily as needed for mild pain. 09/30/13  Yes Emina Riebock, NP  aspirin EC 81 MG tablet Take 81 mg by mouth 3 (three) times a week.  Yes Historical Provider, MD  B Complex Vitamins (VITAMIN B COMPLEX PO) Take 50 mg by mouth every Monday, Wednesday, and Friday.    Yes Historical Provider, MD  escitalopram (LEXAPRO) 20 MG tablet Take 1 tablet (20 mg total) by mouth daily. 10/21/12  Yes Ricard Dillon, MD  gabapentin (NEURONTIN) 100 MG capsule Take 200 mg by mouth 2 (two) times daily.   Yes Historical Provider, MD  rosuvastatin (CRESTOR) 20 MG tablet Take 10 mg by mouth every other day. Monday Wednesday and Friday   Yes Historical Provider, MD  traMADol (ULTRAM) 50 MG tablet Take 1 tablet (50 mg total) by mouth every 6 (six) hours as needed for moderate pain or severe pain. 09/30/13  Yes Emina Riebock, NP  vitamin E (VITAMIN E) 400 UNIT capsule Take 400 Units by mouth every other day.    Yes Historical Provider, MD  ALPHAGAN P 0.1 % SOLN Place 1 drop into both eyes daily. 04/24/13    Historical Provider, MD    Results for orders placed during the hospital encounter of 10/01/13 (from the past 48 hour(s))  CBC WITH DIFFERENTIAL     Status: Abnormal   Collection Time    10/01/13  1:32 PM      Result Value Ref Range   WBC 8.3  4.0 - 10.5 K/uL   RBC 4.55  4.22 - 5.81 MIL/uL   Hemoglobin 14.1  13.0 - 17.0 g/dL   HCT 42.0  39.0 - 52.0 %   MCV 92.3  78.0 - 100.0 fL   MCH 31.0  26.0 - 34.0 pg   MCHC 33.6  30.0 - 36.0 g/dL   RDW 14.2  11.5 - 15.5 %   Platelets 129 (*) 150 - 400 K/uL   Neutrophils Relative % 70  43 - 77 %   Neutro Abs 5.8  1.7 - 7.7 K/uL   Lymphocytes Relative 12  12 - 46 %   Lymphs Abs 1.0  0.7 - 4.0 K/uL   Monocytes Relative 18 (*) 3 - 12 %   Monocytes Absolute 1.5 (*) 0.1 - 1.0 K/uL   Eosinophils Relative 0  0 - 5 %   Eosinophils Absolute 0.0  0.0 - 0.7 K/uL   Basophils Relative 0  0 - 1 %   Basophils Absolute 0.0  0.0 - 0.1 K/uL  COMPREHENSIVE METABOLIC PANEL     Status: Abnormal   Collection Time    10/01/13  1:32 PM      Result Value Ref Range   Sodium 136 (*) 137 - 147 mEq/L   Potassium 3.8  3.7 - 5.3 mEq/L   Chloride 100  96 - 112 mEq/L   CO2 26  19 - 32 mEq/L   Glucose, Bld 98  70 - 99 mg/dL   BUN 26 (*) 6 - 23 mg/dL   Creatinine, Ser 1.52 (*) 0.50 - 1.35 mg/dL   Calcium 8.3 (*) 8.4 - 10.5 mg/dL   Total Protein 6.2  6.0 - 8.3 g/dL   Albumin 3.2 (*) 3.5 - 5.2 g/dL   AST 30  0 - 37 U/L   ALT 17  0 - 53 U/L   Alkaline Phosphatase 67  39 - 117 U/L   Total Bilirubin 1.0  0.3 - 1.2 mg/dL   GFR calc non Af Amer 42 (*) >90 mL/min   GFR calc Af Amer 48 (*) >90 mL/min   Comment: (NOTE)     The eGFR has been calculated using the CKD EPI  equation.     This calculation has not been validated in all clinical situations.     eGFR's persistently <90 mL/min signify possible Chronic Kidney     Disease.   Anion gap 10  5 - 15  URINALYSIS, ROUTINE W REFLEX MICROSCOPIC     Status: Abnormal   Collection Time    10/01/13  3:45 PM      Result  Value Ref Range   Color, Urine AMBER (*) YELLOW   Comment: BIOCHEMICALS MAY BE AFFECTED BY COLOR   APPearance CLEAR  CLEAR   Specific Gravity, Urine 1.024  1.005 - 1.030   pH 5.5  5.0 - 8.0   Glucose, UA NEGATIVE  NEGATIVE mg/dL   Hgb urine dipstick NEGATIVE  NEGATIVE   Bilirubin Urine NEGATIVE  NEGATIVE   Ketones, ur NEGATIVE  NEGATIVE mg/dL   Protein, ur 30 (*) NEGATIVE mg/dL   Urobilinogen, UA 0.2  0.0 - 1.0 mg/dL   Nitrite NEGATIVE  NEGATIVE   Leukocytes, UA NEGATIVE  NEGATIVE  URINE MICROSCOPIC-ADD ON     Status: None   Collection Time    10/01/13  3:45 PM      Result Value Ref Range   Squamous Epithelial / LPF RARE  RARE   WBC, UA 0-2  <3 WBC/hpf   RBC / HPF 0-2  <3 RBC/hpf   Bacteria, UA RARE  RARE   Urine-Other AMORPHOUS URATES/PHOSPHATES     Ct Abdomen Pelvis W Contrast  10/01/2013   CLINICAL DATA:  Abdominal pain and mental status changes. Status post right inguinal hernia repair on 09/29/2013.  EXAM: CT ABDOMEN AND PELVIS WITH CONTRAST  TECHNIQUE: Multidetector CT imaging of the abdomen and pelvis was performed using the standard protocol following bolus administration of intravenous contrast.  CONTRAST:  154m OMNIPAQUE IOHEXOL 300 MG/ML  SOLN  COMPARISON:  09/26/2013  FINDINGS: There is significant inflammation in the region of recent surgery with large amount of soft tissue inflammation present as well as more focal areas of increased subcutaneous soft tissue density overlying the right inguinal canal. Just superior to the inguinal canal, a more focal collection is present with air-fluid level measuring approximately 2.5 x 4.5 cm. Some extraluminal air also tracks up superiorly in the right abdominal wall just superficial to the muscle. Small loculations of air are also identified in the inguinal canal. Findings most likely represent postoperative infection with abscess formation and focal areas of subcutaneous hemorrhage. There is no evidence of extension of bowel into the  inguinal canal.  No evidence of bowel obstruction or ileus. Stable multiple hepatic cysts. Stable mild infrarenal abdominal aortic aneurysm measuring approximately 3.6 cm. No intraperitoneal or retroperitoneal fluid collections. Stable bilateral parapelvic renal cysts. Stable degenerative changes of the lumbar spine.  IMPRESSION: Findings consistent with postoperative infection in the right inguinal region with significant inflammatory changes present, probable focal areas of subcutaneous hemorrhage and abscess formation just superior to the inguinal canal with several loculations of air in the inguinal canal and anterior to the lower right abdominal wall. No evidence of incarcerated or strangulated bowel.  Results were called by telephone at the time of interpretation on 10/01/2013 at 7:52 pm to [facility] PA-C who verbally acknowledged these results.   Electronically Signed   By: GAletta EdouardM.D.   On: 10/01/2013 19:56   Dg Chest Portable 1 View  10/01/2013   CLINICAL DATA:  Pain  EXAM: PORTABLE CHEST - 1 VIEW  COMPARISON:  06/21/2010  FINDINGS: Postsurgical changes are  again seen. The cardiac shadow is within normal limits. The lungs are clear bilaterally. No acute bony abnormality is seen.  IMPRESSION: No active disease.   Electronically Signed   By: Inez Catalina M.D.   On: 10/01/2013 16:17    ROS  Blood pressure 135/80, pulse 79, temperature 99.6 F (37.6 C), temperature source Rectal, resp. rate 13, SpO2 96.00%. Physical Exam  Elderly male in NAD; mildly confused HEENT:  EOMI, sclera anicteric Neck:  No masses, no thyromegaly Lungs:  CTA bilaterally; normal respiratory effort CV:  Regular rate and rhythm; no murmurs Abd:  +bowel sounds, soft, non-tender, no masses GU:  Right groin - large hematoma deep to the incision; ecchymosis tracking laterally towards the hip; tender to palpation Ext:  Well-perfused; no edema Skin:  Warm, dry; no sign of jaundice  Assessment/Plan Post-operative  hematoma after right inguinal hernia repair Slight dehydration No signs of infection - too early after surgery; no physical signs of abscess; normal WBC CT scans shows post-operative changes and hematoma  Admit for pain control, rehydration PT eval tomorrow   Gloriajean Okun K. 10/01/2013, 8:48 PM

## 2013-10-01 NOTE — ED Notes (Signed)
[name], daughter, phone at home [phone], and cell [phone].  Millie, the wife can be reached at  [phone].  Daughter [place], [phone]. Hearing in left ear present on arrival, daughter denies glasses, or dentures.

## 2013-10-01 NOTE — Progress Notes (Addendum)
Clinical Social Work Department CLINICAL SOCIAL WORK PLACEMENT NOTE 10/01/2013  Patient:  [name], [name]  Account Number:  [id number] Admit date:  10/01/2013  Clinical Social Worker:  Creta Levin, LCSW  Date/time:  10/01/2013 11:12 PM  Clinical Social Work is seeking post-discharge placement for this patient at the following level of care:   SKILLED NURSING   (*CSW will update this form in Epic as items are completed)   10/01/2013  Patient/family provided with [place] Department of Clinical Social Work's list of facilities offering this level of care within the geographic area requested by the patient (or if unable, by the patient's family).  10/01/2013  Patient/family informed of their freedom to choose among providers that offer the needed level of care, that participate in Medicare, Medicaid or managed care program needed by the patient, have an available bed and are willing to accept the patient.  10/01/2013  Patient/family informed of [facility]' ownership interest in [facility], as well as of the fact that they are under no obligation to receive care at this facility.  PASARR submitted to EDS on 10/01/2013 PASARR number received on 10/01/2013  FL2 transmitted to all facilities in geographic area requested by pt/family on  10/01/2013 FL2 transmitted to all facilities within larger geographic area on   Patient informed that his/her managed care company has contracts with or will negotiate with  certain facilities, including the following:     Patient/family informed of bed offers received:  10/02/13 ([place], [place], [phone]) Patient chooses bed at [facility]  ([place], [place], [phone]) Physician recommends and patient chooses bed at    Patient to be transferred to [facility] on  10/02/13  ([place], [place], [phone]) Patient to be transferred to facility by Wife/Private transportation  [address] [place], [place], [place]) Patient and family  notified of transfer on 10/02/13  [facility] [place], [place], [place]) Name of family member notified:  [name]  ([place], [place], [phone])  The following physician request were entered in Epic:   Additional Comments:

## 2013-10-01 NOTE — ED Notes (Signed)
Pt complaining of pain located at right side groin area. Nurse informed.

## 2013-10-01 NOTE — ED Notes (Signed)
Reinterated to the family that Dr. Georgette Dover will monitor site, and if it progressively worsens, he may go back to the OR again.

## 2013-10-01 NOTE — ED Provider Notes (Signed)
4:13 PM Patient signed out to me at change of shift by Charlann Lange, PA-C.  Patient with recent inguinal hernia repair, d/c home yesterday, overnight became confused, not eating, acting out of character, couldn't get out of his recliner overnight secondary to pain.  Has temp 100.1 here, large hematoma in RLQ deemed normal postoperative finding by surgery, but with overlying erythema that is concerning to PA [place] and Dr Wilson Singer for cellulitis.  Pt is exquisitely tender in RLQ.  Pending CT abd/pelvis and UA, then admission to [facility] (PCP Benay Pillow, [place]).  Family is interested in placement in rehab facility in future.    6:36 PM Patient reports his pain is somewhat improved.  The RLQ with intact surgical incision with hematoma and overlying ecchymosis.  Also with erythema well beyond the edges of the ecchymosis.  Entire area is tender.  I have asked RN to mark area of erythema.  Pt has not yet been to CT.    7:55 PM Received call from radiology, Dr Kathlene Cote.  Pt has significant postoperative infection in RLQ.    8:03 PM I spoke with Dr Gershon Crane who will also see the patient.   Patient admitted to general surgery.   Results for orders placed during the hospital encounter of 10/01/13  CBC WITH DIFFERENTIAL      Result Value Ref Range   WBC 8.3  4.0 - 10.5 K/uL   RBC 4.55  4.22 - 5.81 MIL/uL   Hemoglobin 14.1  13.0 - 17.0 g/dL   HCT 42.0  39.0 - 52.0 %   MCV 92.3  78.0 - 100.0 fL   MCH 31.0  26.0 - 34.0 pg   MCHC 33.6  30.0 - 36.0 g/dL   RDW 14.2  11.5 - 15.5 %   Platelets 129 (*) 150 - 400 K/uL   Neutrophils Relative % 70  43 - 77 %   Neutro Abs 5.8  1.7 - 7.7 K/uL   Lymphocytes Relative 12  12 - 46 %   Lymphs Abs 1.0  0.7 - 4.0 K/uL   Monocytes Relative 18 (*) 3 - 12 %   Monocytes Absolute 1.5 (*) 0.1 - 1.0 K/uL   Eosinophils Relative 0  0 - 5 %   Eosinophils Absolute 0.0  0.0 - 0.7 K/uL   Basophils Relative 0  0 - 1 %   Basophils Absolute 0.0  0.0 - 0.1 K/uL  COMPREHENSIVE  METABOLIC PANEL      Result Value Ref Range   Sodium 136 (*) 137 - 147 mEq/L   Potassium 3.8  3.7 - 5.3 mEq/L   Chloride 100  96 - 112 mEq/L   CO2 26  19 - 32 mEq/L   Glucose, Bld 98  70 - 99 mg/dL   BUN 26 (*) 6 - 23 mg/dL   Creatinine, Ser 1.52 (*) 0.50 - 1.35 mg/dL   Calcium 8.3 (*) 8.4 - 10.5 mg/dL   Total Protein 6.2  6.0 - 8.3 g/dL   Albumin 3.2 (*) 3.5 - 5.2 g/dL   AST 30  0 - 37 U/L   ALT 17  0 - 53 U/L   Alkaline Phosphatase 67  39 - 117 U/L   Total Bilirubin 1.0  0.3 - 1.2 mg/dL   GFR calc non Af Amer 42 (*) >90 mL/min   GFR calc Af Amer 48 (*) >90 mL/min   Anion gap 10  5 - 15  URINALYSIS, ROUTINE W REFLEX MICROSCOPIC      Result  Value Ref Range   Color, Urine AMBER (*) YELLOW   APPearance CLEAR  CLEAR   Specific Gravity, Urine 1.024  1.005 - 1.030   pH 5.5  5.0 - 8.0   Glucose, UA NEGATIVE  NEGATIVE mg/dL   Hgb urine dipstick NEGATIVE  NEGATIVE   Bilirubin Urine NEGATIVE  NEGATIVE   Ketones, ur NEGATIVE  NEGATIVE mg/dL   Protein, ur 30 (*) NEGATIVE mg/dL   Urobilinogen, UA 0.2  0.0 - 1.0 mg/dL   Nitrite NEGATIVE  NEGATIVE   Leukocytes, UA NEGATIVE  NEGATIVE  URINE MICROSCOPIC-ADD ON      Result Value Ref Range   Squamous Epithelial / LPF RARE  RARE   WBC, UA 0-2  <3 WBC/hpf   RBC / HPF 0-2  <3 RBC/hpf   Bacteria, UA RARE  RARE   Urine-Other AMORPHOUS URATES/PHOSPHATES     Ct Abdomen Pelvis W Contrast  10/01/2013   CLINICAL DATA:  Abdominal pain and mental status changes. Status post right inguinal hernia repair on 09/29/2013.  EXAM: CT ABDOMEN AND PELVIS WITH CONTRAST  TECHNIQUE: Multidetector CT imaging of the abdomen and pelvis was performed using the standard protocol following bolus administration of intravenous contrast.  CONTRAST:  130mL OMNIPAQUE IOHEXOL 300 MG/ML  SOLN  COMPARISON:  09/26/2013  FINDINGS: There is significant inflammation in the region of recent surgery with large amount of soft tissue inflammation present as well as more focal areas of  increased subcutaneous soft tissue density overlying the right inguinal canal. Just superior to the inguinal canal, a more focal collection is present with air-fluid level measuring approximately 2.5 x 4.5 cm. Some extraluminal air also tracks up superiorly in the right abdominal wall just superficial to the muscle. Small loculations of air are also identified in the inguinal canal. Findings most likely represent postoperative infection with abscess formation and focal areas of subcutaneous hemorrhage. There is no evidence of extension of bowel into the inguinal canal.  No evidence of bowel obstruction or ileus. Stable multiple hepatic cysts. Stable mild infrarenal abdominal aortic aneurysm measuring approximately 3.6 cm. No intraperitoneal or retroperitoneal fluid collections. Stable bilateral parapelvic renal cysts. Stable degenerative changes of the lumbar spine.  IMPRESSION: Findings consistent with postoperative infection in the right inguinal region with significant inflammatory changes present, probable focal areas of subcutaneous hemorrhage and abscess formation just superior to the inguinal canal with several loculations of air in the inguinal canal and anterior to the lower right abdominal wall. No evidence of incarcerated or strangulated bowel.  Results were called by telephone at the time of interpretation on 10/01/2013 at 7:52 pm to [facility], PA-C who verbally acknowledged these results.   Electronically Signed   By: Aletta Edouard M.D.   On: 10/01/2013 19:56   Ct Abdomen Pelvis W Contrast  09/26/2013   CLINICAL DATA:  80 year old male. History of right groin pain. Patient has a history of abdominal aortic aneurysm. Prior cholecystectomy, prostate surgery, back surgery, renal stone surgery.  EXAM: CT ABDOMEN AND PELVIS WITH CONTRAST  TECHNIQUE: Multidetector CT imaging of the abdomen and pelvis was performed using the standard protocol following bolus administration of intravenous contrast.   CONTRAST:  1109mL OMNIPAQUE IOHEXOL 300 MG/ML  SOLN  COMPARISON:  Abdomen CT 06/19/2009  FINDINGS: Chest:  Unremarkable appearance of the subcutaneous tissues of the lower chest.  Heart size appears within normal limits. No pericardial fluid/ thickening. Calcifications/stent in distribution the right coronary artery and left anterior descending coronary arteries.  No confluent airspace disease.  Atelectasis at the bilateral bases. No pleural effusion.  Surgical changes of prior median sternotomy.  Abdomen:  Similar number and distribution of low-density cystic structures within the liver parenchyma, however, there has been interval growth of several. Largest within the left liver lobe involving segments 2 and 3 measured 5.5 cm on prior currently measuring 5.9 cm. Within the right liver lobe, segment 6, lesion measured 4.2 cm now measuring 5.3 cm. There are multiple additional lesions throughout the liver. All of the measurable lesions measure fluid density without appreciable wall or nodular enhancement. The remainder of the liver parenchyma appears to be uniform in attenuation/enhancement.  No intrahepatic ductal dilatation.  Surgical change of cholecystectomy.  Mild dilation of the common bile duct, which is unchanged from the comparison. No radiopaque gallstone within the visualized common bile duct.  Unremarkable appearance of the spleen.  Unremarkable appearance the bilateral adrenal glands.  No evidence of hydronephrosis. Unremarkable appearance of the bilateral ureters.  Enteric contrast extends to the transverse colon. No abnormally distended small bowel or colon. No mesenteric inflammatory changes or fluid.  Mild amount of diverticular disease. No associated inflammatory changes.  No peripancreatic fluid or inflammatory changes. Uniform enhancement/ attenuation pancreatic parenchyma.  Unremarkable appearance of the urinary bladder.  Multiple coarse clumped calcifications of the prostate which measures 4.4 cm  in transverse diameter.  Asymmetric size of the seminal vesicles, right greater than left. No associated an inflammatory changes.  Bilateral fact containing inguinal hernia.  Musculoskeletal:  No displaced acute fracture identified. Multilevel degenerative changes of the visualized spine. Multilevel disc disease with vacuum disc phenomenon present throughout the lumbar spine. No significant bony canal stenosis, although there are multiple disc bulges present, most pronounced at L3-S1 with associated neural foraminal narrowing secondary to facet disease.  Vascular:  Atherosclerotic calcifications of the lower thoracic aorta and abdominal aorta. Calcifications involve the origin of mesenteric vessels, including celiac artery, superior mesenteric artery, left greater than right renal artery is, in the inferior mesenteric artery.  Greatest diameter of the infrarenal abdominal aorta measures 32 mm which is not significantly greater than the comparison CT dated 06/19/2009. No dissection flap or periaortic fluid.  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  Calcifications extend to the bilateral iliofemoral system.  IMPRESSION: No acute CT finding to account for the patient's abdominal pain.  Multiple low-density liver cystic lesions are again identified, similar in number and distribution though some have enlarged. These are favored to represent benign cysts given the imaging appearance, however, if further evaluation were warranted, ultrasound or MRI could be considered.  Changes of atherosclerosis/ coronary artery disease with associated ectasia of the infrarenal abdominal aorta. Appearance is unchanged from the comparison CT of 2011, including the greatest diameter of the abdominal aorta, which measures approximately 3.2 cm.  Multilevel all disc degeneration and facet disease of the lumbar spine.  Signed,  Dulcy Fanny. Earleen Newport, DO  [facility]   [facility]   Electronically Signed   By: Corrie Mckusick O.D.   On: 09/26/2013 17:43   Dg Chest Portable 1 View  10/01/2013   CLINICAL DATA:  Pain  EXAM: PORTABLE CHEST - 1 VIEW  COMPARISON:  06/21/2010  FINDINGS: Postsurgical changes are again seen. The cardiac shadow is within normal limits. The lungs are clear bilaterally. No acute bony abnormality is seen.  IMPRESSION: No active disease.   Electronically Signed   By: Inez Catalina M.D.   On: 10/01/2013 16:17  [place], PA-C 10/01/13 [phone]

## 2013-10-01 NOTE — ED Provider Notes (Signed)
CSN: [id number]     Arrival date & time 10/01/13  1141 History   First MD Initiated Contact with Patient 10/01/13 1311     Chief Complaint  Patient presents with  . Abdominal Pain  . Altered Mental Status     (Consider location/radiation/quality/duration/timing/severity/associated sxs/prior Treatment) Patient is a 80 y.o. male presenting with abdominal pain and altered mental status. The history is provided by the patient, the spouse and a relative. No language interpreter was used.  Abdominal Pain Pain location:  RLQ Associated symptoms comment:  Per wife and daughter, the patient underwent right inguinal hernia repair 09/29/13. He was discharged home yesterday and became weak last night, unable to ambulate easily and slept in his recliner. His wife checked on him around 1:00 a.m. and found he had urinated in his chair and was unable to to get out of the chair with her assistance due to generalized weakness. She also noticed a change in his mentation to include confusion. This morning, symptoms persisted with onset of RLQ pain. No vomiting, cough, known fever. He has not any further urinary incontinence.  Altered Mental Status Associated symptoms: abdominal pain     Past Medical History  Diagnosis Date  . Carotid stenosis     s/p Left CEA, followed by Dr. Donnetta Hutching  . Abdominal aortic aneurysm     [place] 07/2012 -3.5 cm max diameter, no change; followed by Dr. Donnetta Hutching  . Radicular low back pain   . BPH (benign prostatic hypertrophy)   . Aphthous ulcer   . Unspecified adverse effect of unspecified drug, medicinal and biological substance   . GERD (gastroesophageal reflux disease)   . History of nephrolithiasis   . Hyperlipidemia   . CAD (coronary artery disease)     s/p CABG 1993;  cath 10/10: severe native 3v CAD, S-Dx ok, S-OM1/OM2 ok, S-PDA ok, L-LAD ok, EF 60%  . Pre-syncope   . 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 HOH (hard of hearing)     hearing aids  . Memory  deficits 06/19/2013  . Throat cancer   . Cervical spondylosis without myelopathy    Past Surgical History  Procedure Laterality Date  . Coronary artery bypass graft    . Coronary artery bypass graft  1998  . Cholecystectomy    . Prostate surgery    . Back surgery    . Renal stone surgery      3 times  . Throat surgery    . Cardiac catheterization    . Carotid endarterectomy Left   . Hernia repair      unclear which side   Family History  Problem Relation Age of Onset  . Coronary artery disease    . Heart disease Father   . Stroke Father   . Stroke Brother   . Coronary artery disease Brother   . Heart disease Brother    History  Substance Use Topics  . Smoking status: Former Smoker    Quit date: 01/16/2006  . Smokeless tobacco: Never Used  . Alcohol Use: No    Review of Systems  Unable to perform ROS: Mental status change  Gastrointestinal: Positive for abdominal pain.      Allergies  Avinza; Doxycycline calcium; Vibramycin; Statins; Codeine sulfate; and Oxycodone  Home Medications   Prior to Admission medications   Medication Sig Start Date End Date Taking? Authorizing Provider  acetaminophen (TYLENOL) 500 MG tablet Take 2 tablets (1,000 mg total) by mouth 3 (  three) times daily. 09/30/13   Emina Riebock, NP  ALPHAGAN P 0.1 % SOLN Place 1 drop into both eyes daily. 04/24/13   Historical Provider, MD  aspirin EC 81 MG tablet Take 81 mg by mouth 3 (three) times a week.    Historical Provider, MD  B Complex Vitamins (VITAMIN B COMPLEX PO) Take 50 mg by mouth every Monday, Wednesday, and Friday.     Historical Provider, MD  escitalopram (LEXAPRO) 20 MG tablet Take 1 tablet (20 mg total) by mouth daily. 10/21/12   Ricard Dillon, MD  gabapentin (NEURONTIN) 100 MG capsule Take 200 mg by mouth 2 (two) times daily.    Historical Provider, MD  rosuvastatin (CRESTOR) 20 MG tablet Take 10 mg by mouth every other day.    Historical Provider, MD  traMADol (ULTRAM) 50 MG tablet  Take 1 tablet (50 mg total) by mouth every 6 (six) hours as needed for moderate pain or severe pain. 09/30/13   Emina Riebock, NP  vitamin E (VITAMIN E) 400 UNIT capsule Take 400 Units by mouth every other day.     Historical Provider, MD   BP 105/64  Pulse 75  Temp(Src) 100.1 F (37.8 C) (Rectal)  Resp 11  SpO2 95% Physical Exam  Constitutional: He appears well-developed and well-nourished.  HENT:  Head: Normocephalic.  Neck: Normal range of motion. Neck supple.  Cardiovascular: Normal rate and regular rhythm.   Pulmonary/Chest: Effort normal and breath sounds normal.  Abdominal: Soft. Bowel sounds are normal. There is no tenderness. There is no rebound and no guarding.  Musculoskeletal: Normal range of motion.  Neurological: He is alert. Coordination normal.  He is essentially non-verbal, repeating only "it burns". He will follow command.  Skin: Skin is warm and dry. No rash noted.  Psychiatric: He has a normal mood and affect.    ED Course  Procedures (including critical care time) Labs Review Labs Reviewed  CBC WITH DIFFERENTIAL - Abnormal; Notable for the following:    Platelets 129 (*)    Monocytes Relative 18 (*)    Monocytes Absolute 1.5 (*)    All other components within normal limits  COMPREHENSIVE METABOLIC PANEL  URINALYSIS, ROUTINE W REFLEX MICROSCOPIC   Results for orders placed during the hospital encounter of 10/01/13  CBC WITH DIFFERENTIAL      Result Value Ref Range   WBC 8.3  4.0 - 10.5 K/uL   RBC 4.55  4.22 - 5.81 MIL/uL   Hemoglobin 14.1  13.0 - 17.0 g/dL   HCT 42.0  39.0 - 52.0 %   MCV 92.3  78.0 - 100.0 fL   MCH 31.0  26.0 - 34.0 pg   MCHC 33.6  30.0 - 36.0 g/dL   RDW 14.2  11.5 - 15.5 %   Platelets 129 (*) 150 - 400 K/uL   Neutrophils Relative % 70  43 - 77 %   Neutro Abs 5.8  1.7 - 7.7 K/uL   Lymphocytes Relative 12  12 - 46 %   Lymphs Abs 1.0  0.7 - 4.0 K/uL   Monocytes Relative 18 (*) 3 - 12 %   Monocytes Absolute 1.5 (*) 0.1 - 1.0 K/uL    Eosinophils Relative 0  0 - 5 %   Eosinophils Absolute 0.0  0.0 - 0.7 K/uL   Basophils Relative 0  0 - 1 %   Basophils Absolute 0.0  0.0 - 0.1 K/uL  COMPREHENSIVE METABOLIC PANEL      Result Value Ref Range  Sodium 136 (*) 137 - 147 mEq/L   Potassium 3.8  3.7 - 5.3 mEq/L   Chloride 100  96 - 112 mEq/L   CO2 26  19 - 32 mEq/L   Glucose, Bld 98  70 - 99 mg/dL   BUN 26 (*) 6 - 23 mg/dL   Creatinine, Ser 1.52 (*) 0.50 - 1.35 mg/dL   Calcium 8.3 (*) 8.4 - 10.5 mg/dL   Total Protein 6.2  6.0 - 8.3 g/dL   Albumin 3.2 (*) 3.5 - 5.2 g/dL   AST 30  0 - 37 U/L   ALT 17  0 - 53 U/L   Alkaline Phosphatase 67  39 - 117 U/L   Total Bilirubin 1.0  0.3 - 1.2 mg/dL   GFR calc non Af Amer 42 (*) >90 mL/min   GFR calc Af Amer 48 (*) >90 mL/min   Anion gap 10  5 - 15    Imaging Review No results found.   EKG Interpretation None      MDM   Final diagnoses:  None    1. Cellulitis right groin  He is s/p hernia repair, evaluated by surgery team in ED. Not felt current symptoms related to surgical procedure and ok to discharge from surgical standpoint. He has symptoms of increased pain in RLQ/right groin with low grade temperature and reported confusion by family members. Dr. Wilson Singer has evaluated the patient. Feel given advanced age, history of confusion in setting of fever, physical exam findings that the patient should be admitted.  Discussed care plan with family who agree with admission plan. They have requested evaluation for nursing home placement for rehab until strong enough to return home in the care of his wife who does not feel she can care for him in the current condition.     Dewaine Oats, PA-C 10/01/13 1615

## 2013-10-01 NOTE — ED Notes (Signed)
The daughter wants the staff to know that he does not take blood thinners.

## 2013-10-01 NOTE — Progress Notes (Signed)
Clinical Social Work Department BRIEF PSYCHOSOCIAL ASSESSMENT 10/01/2013  Patient:  [name], [name]     Account Number:  [id number]     Admit date:  10/01/2013  Clinical Social Worker:  Ulyess Blossom  Date/Time:  10/01/2013 11:01 AM  Referred by:  CSW  Date Referred:  10/01/2013 Referred for  SNF Placement   Other Referral:   Interview type:  Family Other interview type:   Pt, his daughter and wife.  Database review.    PSYCHOSOCIAL DATA Living Status:  WIFE Admitted from facility:   Level of care:   Primary support name:  mildred Primary support relationship to patient:  SPOUSE Degree of support available:   Good, however wife cannot provide physical support to pt.    CURRENT CONCERNS Current Concerns  Post-Acute Placement   Other Concerns:    SOCIAL WORK ASSESSMENT / PLAN CSW met with pt and family to discuss NHP.  Pt s/p hernia surgery (d/c on 9/15) now presenting to ED with AMS, weakness, unable to stand etc. (Pt independent with ADLS prior to surgery).  Wife was un able to get pt up from chair today so she brought him to the ED for evaluation. family is concerned that pt may need ST SNF at d/c if he cannot assist wife in his care.   Assessment/plan status:  Psychosocial Support/Ongoing Assessment of Needs Other assessment/ plan:   CSW will bein NH bed search and facilitate NH tx as appropriate.   Information/referral to community resources:    PATIENT'S/FAMILY'S RESPONSE TO PLAN OF CARE: Wife very tired of being in the ED all day and wanting to go home.  Family frustrated about the wait time for CT scan.  Pt pleasantly confused.  Emotional support offered.

## 2013-10-01 NOTE — ED Notes (Signed)
Pt daughter and wife had concerns about pt medicines. Informed RN about concerns.

## 2013-10-01 NOTE — ED Notes (Signed)
Called [facility] to follow up with bed request.  She will place for bed assignment.

## 2013-10-01 NOTE — Progress Notes (Addendum)
Patient ID: [name], male   DOB: 08-06-1931, 80 y.o.   MRN: [id number]    Subjective: Pt was just discharged from the hospital yesterday after a 4 day admission for an incarcerated right inguinal hernia.  He underwent a laparoscopic right inguinal hernia repair Monday.  PT evaluated the patient and he walked 17ft with his walker and was felt stable for dc home with no further needs.  Apparently, since being at home the wife states she can not take care of him.  He is unable to mobilize on his own and had to have someone come help her move him, etc.  He is also has some swelling at his incision site.  She brought him back to [facility] today essentially for placement.  We were asked to come see him for this incision to be evaluated.  Objective: Vital signs in last 24 hours: Temp:  [97.9 F (36.6 C)-100.1 F (37.8 C)] 100.1 F (37.8 C) (09/16 1324) Pulse Rate:  [75-81] 75 (09/16 1330) Resp:  [11-22] 11 (09/16 1330) BP: (92-121)/(52-66) 105/64 mmHg (09/16 1330) SpO2:  [94 %-98 %] 95 % (09/16 1330)    Intake/Output from previous day:   Intake/Output this shift:    PE: Abd: soft, NT, ND, +BS GU: right inguinal hernia incision with hematoma present.  This extends into his scrotum as well as the proximal portion of his thigh.  There is NO evidence of cellulitis or infection.  This is tender appropriately given his hematoma.  Lab Results:   Recent Labs  10/01/13 1332  WBC 8.3  HGB 14.1  HCT 42.0  PLT 129*   BMET  Recent Labs  10/01/13 1332  NA 136*  K 3.8  CL 100  CO2 26  GLUCOSE 98  BUN 26*  CREATININE 1.52*  CALCIUM 8.3*   PT/INR No results found for this basename: LABPROT, INR,  in the last 72 hours CMP     Component Value Date/Time   NA 136* 10/01/2013 1332   K 3.8 10/01/2013 1332   CL 100 10/01/2013 1332   CO2 26 10/01/2013 1332   GLUCOSE 98 10/01/2013 1332   BUN 26* 10/01/2013 1332   CREATININE 1.52* 10/01/2013 1332   CALCIUM 8.3* 10/01/2013 1332   PROT 6.2 10/01/2013  1332   ALBUMIN 3.2* 10/01/2013 1332   AST 30 10/01/2013 1332   ALT 17 10/01/2013 1332   ALKPHOS 67 10/01/2013 1332   BILITOT 1.0 10/01/2013 1332   GFRNONAA 42* 10/01/2013 1332   GFRAA 48* 10/01/2013 1332   Lipase  No results found for this basename: lipase       Studies/Results: No results found.  Anti-infectives: Anti-infectives   None       Assessment/Plan  1. POD 2, s/p open right inguinal hernia repair with mesh 2. 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 There is no evidence of post operative complication or infection.  The patient does not need to be admitted to the hospital from our standpoint.  The wife would like him to be placed into a SNF from the ED if possible.  I have d/w Social Work, Music therapist, who will look into getting him placed from the ED.  I have also d/w the EDPA, Shari Upstill.  He may follow up with Dr. Donne Hazel in the office at his previously scheduled follow up.   LOS: 0 days    Aland Chestnutt E 10/01/2013, 2:41 PM Pager: [phone]

## 2013-10-01 NOTE — ED Notes (Signed)
Pt has started a productive cough, lungs clear.  Dr Wilson Singer at bedside

## 2013-10-01 NOTE — ED Notes (Signed)
Offered to reapply ice pack, but patient doesn't want at this time.

## 2013-10-02 ENCOUNTER — Encounter (HOSPITAL_COMMUNITY): Payer: Self-pay | Admitting: *Deleted

## 2013-10-02 DIAGNOSIS — IMO0002 Reserved for concepts with insufficient information to code with codable children: Secondary | ICD-10-CM | POA: Diagnosis not present

## 2013-10-02 LAB — BASIC METABOLIC PANEL
Anion gap: 13 (ref 5–15)
BUN: 19 mg/dL (ref 6–23)
CHLORIDE: 102 meq/L (ref 96–112)
CO2: 23 mEq/L (ref 19–32)
CREATININE: 1.13 mg/dL (ref 0.50–1.35)
Calcium: 7.8 mg/dL — ABNORMAL LOW (ref 8.4–10.5)
GFR calc non Af Amer: 59 mL/min — ABNORMAL LOW (ref >90)
GFR, EST AFRICAN AMERICAN: 69 mL/min — AB (ref >90)
GLUCOSE: 84 mg/dL (ref 70–99)
POTASSIUM: 3.8 meq/L (ref 3.7–5.3)
Sodium: 138 mEq/L (ref 137–147)

## 2013-10-02 LAB — CBC
HEMATOCRIT: 36.7 % — AB (ref 39.0–52.0)
HEMOGLOBIN: 12.4 g/dL — AB (ref 13.0–17.0)
MCH: 31 pg (ref 26.0–34.0)
MCHC: 33.8 g/dL (ref 30.0–36.0)
MCV: 91.8 fL (ref 78.0–100.0)
Platelets: 124 10*3/uL — ABNORMAL LOW (ref 150–400)
RBC: 4 MIL/uL — AB (ref 4.22–5.81)
RDW: 13.8 % (ref 11.5–15.5)
WBC: 7.5 10*3/uL (ref 4.0–10.5)

## 2013-10-02 LAB — PROTIME-INR
INR: 1.28 (ref 0.00–1.49)
PROTHROMBIN TIME: 16 s — AB (ref 11.6–15.2)

## 2013-10-02 MED ORDER — TRAMADOL HCL 50 MG PO TABS: 50.0000 mg | ORAL_TABLET | Freq: Four times a day (QID) | ORAL | Status: DC | PRN

## 2013-10-02 NOTE — Clinical Social Work Note (Signed)
Patient will discharge to [facility] Anticipated discharge date: 10/02/13 Family notified: Meredith Mells (wife) Transportation by wife/private transportation  [place] signing off.  Domenica Reamer, [place] Social Worker [phone]

## 2013-10-02 NOTE — Progress Notes (Signed)
Discharge to [place] center, report given to [place], Therapist, sports. Patient wife transporting patient to the nursing home. Discharge paper given to wife.

## 2013-10-02 NOTE — Progress Notes (Signed)
Agree with above, never had an infection, had an open ih repair not lap repair, hematoma resolve

## 2013-10-02 NOTE — Discharge Summary (Addendum)
Patient ID: ERVAN HEBER MRN: [id number] DOB/AGE: [birth year] 78 y.o.  Admit date: 10/01/2013 Discharge date: 10/02/2013  Procedures: none  Consults: None  Reason for Admission: 80 yo male with multiple medical issues presents after right inguinal hernia repair with mesh on 09/29/13. He had been hospitalized over the weekend for cardiac clearance. During this time, he had received Lovenox. He was discharged about 24 hours ago. He has developed significant swelling and tenderness in his right groin. He also has had some confusion, as well as incontinence of urine. He was brought back to the ED tonight for evaluation.  Admission Diagnoses:  1. Deconditioning 2. S/p open right inguinal hernia repair 3. Chronic right hip pain 4. Multiple medical problems  Hospital Course: the patient was admitted.  His labs remained stable.  His hematoma remained stable, to actually improved.  There is no clinical evidence of post operative infection, just a postoperative hematoma.  The patient is stable for dc to SNF when bed available  Discharge Diagnoses:  Active Problems:   Postoperative hematoma   Discharge Medications:   Medication List         acetaminophen 500 MG tablet  Commonly known as:  TYLENOL  Take 1,000 mg by mouth daily as needed for mild pain.     ALPHAGAN P 0.1 % Soln  Generic drug:  brimonidine  Place 1 drop into both eyes daily.     aspirin EC 81 MG tablet  Take 81 mg by mouth 3 (three) times a week.     escitalopram 20 MG tablet  Commonly known as:  LEXAPRO  Take 1 tablet (20 mg total) by mouth daily.     gabapentin 100 MG capsule  Commonly known as:  NEURONTIN  Take 200 mg by mouth 2 (two) times daily.     rosuvastatin 20 MG tablet  Commonly known as:  CRESTOR  Take 10 mg by mouth every other day. Monday Wednesday and Friday     traMADol 50 MG tablet  Commonly known as:  ULTRAM  Take 1 tablet (50 mg total) by mouth every 6 (six) hours as needed for moderate pain or  severe pain.     VITAMIN B COMPLEX PO  Take 50 mg by mouth every Monday, Wednesday, and Friday.     vitamin E 400 UNIT capsule  Generic drug:  vitamin E  Take 400 Units by mouth every other day.        Discharge Instructions:     Follow-up Information   Follow up with [facility], [place]. Schedule an appointment as soon as possible for a visit in 3 weeks.   Specialty:  General Surgery   Contact information:   [address] [place] [address] [phone]       Signed: Henreitta Cea 10/02/2013, 9:08 AM

## 2013-10-02 NOTE — Discharge Summary (Signed)
He had an open rih repair

## 2013-10-02 NOTE — Anesthesia Postprocedure Evaluation (Signed)
  Anesthesia Post-op Note  Patient: [name]  Procedure(s) Performed: Procedure(s): HERNIA REPAIR INGUINAL ADULT (Right) INSERTION OF MESH (Right)  Patient Location: PACU  Anesthesia Type:General  Level of Consciousness: awake  Airway and Oxygen Therapy: Patient Spontanous Breathing  Post-op Pain: mild  Post-op Assessment: Post-op Vital signs reviewed  Post-op Vital Signs: Reviewed  Last Vitals:  Filed Vitals:   09/30/13 1601  BP: 92/52  Pulse: 77  Temp: 36.6 C  Resp: 18    Complications: No apparent anesthesia complications

## 2013-10-02 NOTE — Discharge Instructions (Signed)
[facility] _______[facility]  UMBILICAL OR INGUINAL HERNIA REPAIR: POST OP INSTRUCTIONS  Always review your discharge instruction sheet given to you by the facility where your surgery was performed. IF YOU HAVE DISABILITY OR FAMILY LEAVE FORMS, YOU MUST BRING THEM TO THE OFFICE FOR PROCESSING.   DO NOT GIVE THEM TO YOUR DOCTOR.  1. A  prescription for pain medication may be given to you upon discharge.  Take your pain medication as prescribed, if needed.  If narcotic pain medicine is not needed, then you may take acetaminophen (Tylenol) or ibuprofen (Advil) as needed. 2. Take your usually prescribed medications unless otherwise directed. 3. If you need a refill on your pain medication, please contact your pharmacy.  They will contact our office to request authorization. Prescriptions will not be filled after 5 pm or on week-ends. 4. You should follow a light diet the first 24 hours after arrival home, such as soup and crackers, etc.  Be sure to include lots of fluids daily.  Resume your normal diet the day after surgery. 5. Most patients will experience some swelling and bruising around the umbilicus or in the groin and scrotum.  Ice packs and reclining will help.  Swelling and bruising can take several days to resolve.  6. It is common to experience some constipation if taking pain medication after surgery.  Increasing fluid intake and taking a stool softener (such as Colace) will usually help or prevent this problem from occurring.  A mild laxative (Milk of Magnesia or Miralax) should be taken according to package directions if there are no bowel movements after 48 hours. 7. Unless discharge instructions indicate otherwise, you may remove your bandages 24-48 hours after surgery, and you may shower at that time.  You may have steri-strips (small skin tapes) in place directly over the incision.  These strips should be left on the skin for 7-10 days.  If your surgeon used skin glue on the  incision, you may shower in 24 hours.  The glue will flake off over the next 2-3 weeks.  Any sutures or staples will be removed at the office during your follow-up visit. 8. ACTIVITIES:  You may resume regular (light) daily activities beginning the next day--such as daily self-care, walking, climbing stairs--gradually increasing activities as tolerated.  You may have sexual intercourse when it is comfortable.  Refrain from any heavy lifting or straining until approved by your doctor. a. You may drive when you are no longer taking prescription pain medication, you can comfortably wear a seatbelt, and you can safely maneuver your car and apply brakes. b. RETURN TO WORK:  __________________________________________________________ 9. You should see your doctor in the office for a follow-up appointment approximately 2-3 weeks after your surgery.  Make sure that you call for this appointment within a day or two after you arrive home to insure a convenient appointment time. 10. OTHER INSTRUCTIONS:  __________________________________________________________________________________________________________________________________________________________________________________________  WHEN TO CALL YOUR DOCTOR: 1. Fever over 101.0 2. Inability to urinate 3. Nausea and/or vomiting 4. Extreme swelling or bruising 5. Continued bleeding from incision. 6. Increased pain, redness, or drainage from the incision  The clinic staff is available to answer your questions during regular business hours.  Please don't hesitate to call and ask to speak to one of the nurses for clinical concerns.  If you have a medical emergency, go to the nearest emergency room or call 911.  A surgeon from [facility] is always on call at the hospital     [address], [address], [place], [place]  [address] ?  [address] [address], [place], [place]   [address] [phone] ? [phone] ? FAX [phone] Web site:  [email or web address]  

## 2013-10-02 NOTE — Progress Notes (Addendum)
Patient ID: [name], male   DOB: 15-Oct-1931, 80 y.o.   MRN: [id number]    Subjective: The patient has no complaints this morning.  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[facility] with minimal assistance per tech  Objective: Vital signs in last 24 hours: Temp:  [97.7 F (36.5 C)-100.1 F (37.8 C)] 97.7 F (36.5 C) (09/17 0535) Pulse Rate:  [62-88] 62 (09/17 0535) Resp:  [11-24] 16 (09/17 0535) BP: (94-148)/(55-80) 122/62 mmHg (09/17 0535) SpO2:  [94 %-98 %] 98 % (09/17 0535) Weight:  [165 lb 3.2 oz (74.934 kg)] 165 lb 3.2 oz (74.934 kg) (09/16 2244) Last BM Date:  (pt unsure)  Intake/Output from previous day: 09/16 0701 - 09/17 0700 In: 1596.3 [I.V.:1596.3] Out: 616 [Urine:616] Intake/Output this shift:    PE: Abd: soft, NT, ND GU: right inguinal hematoma actually is better than yesterday.  No evidence of cellulitis or infection.  Lab Results:   Recent Labs  10/01/13 1332 10/02/13 0348  WBC 8.3 7.5  HGB 14.1 12.4*  HCT 42.0 36.7*  PLT 129* 124*   BMET  Recent Labs  10/01/13 1332 10/02/13 0348  NA 136* 138  K 3.8 3.8  CL 100 102  CO2 26 23  GLUCOSE 98 84  BUN 26* 19  CREATININE 1.52* 1.13  CALCIUM 8.3* 7.8*   PT/INR  Recent Labs  10/02/13 0348  LABPROT 16.0*  INR 1.28   CMP     Component Value Date/Time   NA 138 10/02/2013 0348   K 3.8 10/02/2013 0348   CL 102 10/02/2013 0348   CO2 23 10/02/2013 0348   GLUCOSE 84 10/02/2013 0348   BUN 19 10/02/2013 0348   CREATININE 1.13 10/02/2013 0348   CALCIUM 7.8* 10/02/2013 0348   PROT 6.2 10/01/2013 1332   ALBUMIN 3.2* 10/01/2013 1332   AST 30 10/01/2013 1332   ALT 17 10/01/2013 1332   ALKPHOS 67 10/01/2013 1332   BILITOT 1.0 10/01/2013 1332   GFRNONAA 59* 10/02/2013 0348   GFRAA 69* 10/02/2013 0348   Lipase  No results found for this basename: lipase       Studies/Results: Ct Abdomen Pelvis W Contrast  10/01/2013   CLINICAL DATA:  Abdominal pain and mental status changes. Status post right inguinal hernia repair on  09/29/2013.  EXAM: CT ABDOMEN AND PELVIS WITH CONTRAST  TECHNIQUE: Multidetector CT imaging of the abdomen and pelvis was performed using the standard protocol following bolus administration of intravenous contrast.  CONTRAST:  150mL OMNIPAQUE IOHEXOL 300 MG/ML  SOLN  COMPARISON:  09/26/2013  FINDINGS: There is significant inflammation in the region of recent surgery with large amount of soft tissue inflammation present as well as more focal areas of increased subcutaneous soft tissue density overlying the right inguinal canal. Just superior to the inguinal canal, a more focal collection is present with air-fluid level measuring approximately 2.5 x 4.5 cm. Some extraluminal air also tracks up superiorly in the right abdominal wall just superficial to the muscle. Small loculations of air are also identified in the inguinal canal. Findings most likely represent postoperative infection with abscess formation and focal areas of subcutaneous hemorrhage. There is no evidence of extension of bowel into the inguinal canal.  No evidence of bowel obstruction or ileus. Stable multiple hepatic cysts. Stable mild infrarenal abdominal aortic aneurysm measuring approximately 3.6 cm. No intraperitoneal or retroperitoneal fluid collections. Stable bilateral parapelvic renal cysts. Stable degenerative changes of the lumbar spine.  IMPRESSION: Findings consistent with postoperative infection in the right  inguinal region with significant inflammatory changes present, probable focal areas of subcutaneous hemorrhage and abscess formation just superior to the inguinal canal with several loculations of air in the inguinal canal and anterior to the lower right abdominal wall. No evidence of incarcerated or strangulated bowel.  Results were called by telephone at the time of interpretation on 10/01/2013 at 7:52 pm to [facility], PA-C who verbally acknowledged these results.   Electronically Signed   By: Aletta Edouard M.D.   On: 10/01/2013  19:56   Dg Chest Portable 1 View  10/01/2013   CLINICAL DATA:  Pain  EXAM: PORTABLE CHEST - 1 VIEW  COMPARISON:  06/21/2010  FINDINGS: Postsurgical changes are again seen. The cardiac shadow is within normal limits. The lungs are clear bilaterally. No acute bony abnormality is seen.  IMPRESSION: No active disease.   Electronically Signed   By: Inez Catalina M.D.   On: 10/01/2013 16:17    Anti-infectives: Anti-infectives   Start     Dose/Rate Route Frequency Ordered Stop   10/02/13 0600  ceFAZolin (ANCEF) IVPB 1 g/50 mL premix     1 g 100 mL/hr over 30 Minutes Intravenous On call to O.R. 10/01/13 2248 10/03/13 0559   10/01/13 1515  vancomycin (VANCOCIN) IVPB 1000 mg/200 mL premix     1,000 mg 200 mL/hr over 60 Minutes Intravenous  Once 10/01/13 1511 10/01/13 1714       Assessment/Plan  1. POD 3, s/p open right inguinal hernia repair 2. Short term memory loss 3. Deconditioning  Plan: 1. Wife unable to take care of patient at home.  Placement pending.  No evidence of post op infection.  Just a post op hematoma.  Patient stable for dc to SNF when bed available.   LOS: 1 day    Phong Isenberg E 10/02/2013, 9:03 AM Pager: [phone]

## 2013-10-06 NOTE — ED Provider Notes (Signed)
Medical screening examination/treatment/procedure(s) were conducted as a shared visit with non-physician practitioner(s) and myself.  I personally evaluated the patient during the encounter.   EKG Interpretation   Date/Time:  Wednesday October 01 2013 13:24:54 EDT Ventricular Rate:  76 PR Interval:  167 QRS Duration: 79 QT Interval:  563 QTC Calculation: 633 R Axis:   31 Text Interpretation:  Sinus rhythm Atrial premature complex Borderline T  abnormalities, anterior leads Prolonged QT interval ED PHYSICIAN  INTERPRETATION AVAILABLE IN [facility] Confirmed by TEST, Record  ([id number]) on 10/03/2013 9:40:49 AM     Pt with post-op pain. Surgical site intact with underlying hematoma. Surrounding ecchymosis. Some faint erythema adjacent to this along proximal R thigh. Post-op changed versus developing cellulitis.   Virgel Manifold, MD 10/07/13 0001

## 2013-10-10 NOTE — ED Provider Notes (Signed)
Medical screening examination/treatment/procedure(s) were conducted as a shared visit with non-physician practitioner(s) and myself.  I personally evaluated the patient during the encounter.   EKG Interpretation   Date/Time:  Wednesday October 01 2013 13:24:54 EDT Ventricular Rate:  76 PR Interval:  167 QRS Duration: 79 QT Interval:  563 QTC Calculation: 633 R Axis:   31 Text Interpretation:  Sinus rhythm Atrial premature complex Borderline T  abnormalities, anterior leads Prolonged QT interval ED PHYSICIAN  INTERPRETATION AVAILABLE IN [facility] Confirmed by TEST, Record  ([id number]) on 10/03/2013 9:40:49 AM       Virgel Manifold, MD 10/10/13 0028

## 2013-10-13 ENCOUNTER — Encounter: Payer: Medicare Other | Admitting: Cardiovascular Disease

## 2013-10-20 ENCOUNTER — Encounter: Payer: Self-pay | Admitting: Physician Assistant

## 2013-10-20 ENCOUNTER — Ambulatory Visit (INDEPENDENT_AMBULATORY_CARE_PROVIDER_SITE_OTHER): Payer: Medicare Other | Admitting: Physician Assistant

## 2013-10-20 VITALS — BP 102/60 | HR 70 | Ht 70.0 in | Wt 170.0 lb

## 2013-10-20 DIAGNOSIS — I714 Abdominal aortic aneurysm, without rupture, unspecified: Secondary | ICD-10-CM

## 2013-10-20 DIAGNOSIS — I10 Essential (primary) hypertension: Secondary | ICD-10-CM | POA: Diagnosis not present

## 2013-10-20 DIAGNOSIS — I2510 Atherosclerotic heart disease of native coronary artery without angina pectoris: Secondary | ICD-10-CM

## 2013-10-20 DIAGNOSIS — I25810 Atherosclerosis of coronary artery bypass graft(s) without angina pectoris: Secondary | ICD-10-CM | POA: Diagnosis not present

## 2013-10-20 NOTE — Progress Notes (Signed)
HPI: This is a very pleasant 80 year old male patient [name] who has history of coronary artery disease status post CABG in 1993 with cath in 2010 showing patent grafts and preserved LV function. He also has abdominal aortic aneurysm with most recent ultrasound in July 2015 showing stable aneurysm at 3.43 cm and right iliac stenosis greater than 50% which was new for him. He also has history of  carotid endarterectomy.    He was recently hospitalized for hernia repair and was seen for cardiac clearance. 2-D echo showed preserved LV function EF 50-55% with elevated right ventricular pressure or volume over load and grade 1 diastolic dysfunction. He had a hernia repair but has been in and out of hospitals since with pain and hematoma. He follows up with the surgeon tomorrow. His aspirin had been stopped during this time. Overall the patient doesn't feel well today he says he is just tired and aches all over. He denies any chest pain, palpitations, dyspnea, dyspnea on exertion, dizziness, or presyncope. He hasn't been eating much since his surgery.  Allergies  Allergen Reactions  . Avinza [Morphine Sulfate] Other (See Comments)    Altered mental status. "about killed him"  . Doxycycline Calcium Other (See Comments)    Causes patient chronic colitis; was hospitalized two weeks due to med.  . Vibramycin [Doxycycline Hyclate] Other (See Comments)    Causes patient colitis; was hospitalized two weeks due to med  . Statins Other (See Comments)    Muscle pain; can tolerate crestor 10 mg every other day  . Codeine Sulfate Nausea And Vomiting  . Oxycodone Other (See Comments)    Angry; altered mental status; "wild and crazy"     Current Outpatient Prescriptions  Medication Sig Dispense Refill  . ALPHAGAN P 0.1 % SOLN Place 1 drop into both eyes daily.      . B Complex Vitamins (VITAMIN B COMPLEX PO) Take 50 mg by mouth every Monday, Wednesday, and Friday.       . escitalopram  (LEXAPRO) 20 MG tablet Take 1 tablet (20 mg total) by mouth daily.  90 tablet  3  . gabapentin (NEURONTIN) 100 MG capsule Take 200 mg by mouth 2 (two) times daily.      . rosuvastatin (CRESTOR) 20 MG tablet Take 10 mg by mouth every other day. Monday Wednesday and Friday      . vitamin E (VITAMIN E) 400 UNIT capsule Take 400 Units by mouth every other day.       Marland Kitchen acetaminophen (TYLENOL) 500 MG tablet Take 1,000 mg by mouth daily as needed for mild pain.      Marland Kitchen aspirin EC 81 MG tablet Take 81 mg by mouth 3 (three) times a week.      . traMADol (ULTRAM) 50 MG tablet Take 1 tablet (50 mg total) by mouth every 6 (six) hours as needed for moderate pain or severe pain.  30 tablet  0   No current facility-administered medications for this visit.    Past Medical History  Diagnosis Date  . Carotid stenosis     s/p Left CEA, followed by Dr. Donnetta [name]  . Abdominal aortic aneurysm     [place] 07/2012 -3.5 cm max diameter, no change; followed by Dr. Donnetta [name]  . Radicular low back pain   . BPH (benign prostatic hypertrophy)   . Aphthous ulcer   . Unspecified adverse effect of unspecified drug, medicinal and biological substance   . GERD (gastroesophageal reflux disease)   .  History of nephrolithiasis   . Hyperlipidemia   . CAD (coronary artery disease)     s/p CABG 1993;  cath 10/10: severe native 3v CAD, S-Dx ok, S-OM1/OM2 ok, S-PDA ok, L-LAD ok, EF 60%  . Pre-syncope   . 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 HOH (hard of hearing)     hearing aids  . Memory deficits 06/19/2013  . Throat cancer   . Cervical spondylosis without myelopathy     Past Surgical History  Procedure Laterality Date  . Coronary artery bypass graft    . Coronary artery bypass graft  1998  . Cholecystectomy    . Prostate surgery    . Back surgery    . Renal stone surgery      3 times  . Throat surgery    . Cardiac catheterization    . Carotid endarterectomy Left   . Hernia repair      unclear which side  .  Inguinal hernia repair Right 09/29/2013    Procedure: HERNIA REPAIR INGUINAL ADULT;  Surgeon: Brett Bookbinder, MD;  Location: [place];  Service: General;  Laterality: Right;  . Insertion of mesh Right 09/29/2013    Procedure: INSERTION OF MESH;  Surgeon: Brett Bookbinder, MD;  Location: [place];  Service: General;  Laterality: Right;    Family History  Problem Relation Age of Onset  . Coronary artery disease    . Heart disease Father   . Stroke Father   . Stroke Brother   . Coronary artery disease Brother   . Heart disease Brother     History   Social History  . Marital Status: Married    Spouse Name: [name]    Number of Children: 3  . Years of Education: 11th   Occupational History  . Retired    Social History Main Topics  . Smoking status: Former Smoker    Quit date: 01/16/2006  . Smokeless tobacco: Never Used  . Alcohol Use: No  . Drug Use: No  . Sexual Activity: Not Currently   Other Topics Concern  . Not on file   Social History Narrative   Lives with wife.    ROS: See history of present illness otherwise negative  BP 102/60  Pulse 70  Ht 5\' 10"  (1.778 m)  Wt 170 lb (77.111 kg)  BMI 24.39 kg/m2  PHYSICAL EXAM: Well-nournished, in no acute distress. Neck: No JVD, HJR, Bruit, or thyroid enlargement  Lungs: No tachypnea, clear without wheezing, rales, or rhonchi  Cardiovascular: RRR, PMI not displaced, positive S4, and 1/6 systolic murmur at the left sternal border, no bruit, thrill, or heave.  Abdomen: BS normal. Soft without organomegaly, masses, lesions or tenderness.  Extremities: without cyanosis, clubbing or edema. Good distal pulses bilateral  SKin: Warm, no lesions or rashes   Musculoskeletal: No deformities  Neuro: no focal signs    Wt Readings from Last 3 Encounters:  10/20/13 170 lb (77.111 kg)  10/01/13 165 lb 3.2 oz (74.934 kg)  09/29/13 168 lb 4.8 oz (76.34 kg)

## 2013-10-20 NOTE — Assessment & Plan Note (Signed)
Stable on ultrasound in July 2015. Followed by Dr. Donnetta Hutching.

## 2013-10-20 NOTE — Assessment & Plan Note (Signed)
BP on low side today, not on rate lowering meds. Increase fluids in diet. Decrease soda.

## 2013-10-20 NOTE — Patient Instructions (Signed)
Your physician recommends that you continue on your current medications as directed. Please refer to the Current Medication list given to you today.  Your physician wants you to follow-up in: 1 year ov with Dr Emelda Fear will receive a reminder letter in the mail two months in advance. If you don't receive a letter, please call our office to schedule the follow-up appointment.

## 2013-10-20 NOTE — Assessment & Plan Note (Addendum)
Patient doing well without chest grafts patent on cardiac catheterization in 2010. Resume aspirin if okay with the surgeons tomorrow.

## 2013-10-21 ENCOUNTER — Other Ambulatory Visit: Payer: Self-pay | Admitting: Internal Medicine

## 2013-10-21 ENCOUNTER — Ambulatory Visit: Payer: Medicare Other | Admitting: Adult Health

## 2013-10-24 ENCOUNTER — Encounter: Payer: Self-pay | Admitting: Family Medicine

## 2013-10-24 ENCOUNTER — Ambulatory Visit (INDEPENDENT_AMBULATORY_CARE_PROVIDER_SITE_OTHER): Payer: Medicare Other | Admitting: Family Medicine

## 2013-10-24 VITALS — BP 102/70 | Temp 98.1°F | Wt 171.0 lb

## 2013-10-24 DIAGNOSIS — F329 Major depressive disorder, single episode, unspecified: Secondary | ICD-10-CM | POA: Diagnosis not present

## 2013-10-24 DIAGNOSIS — E785 Hyperlipidemia, unspecified: Secondary | ICD-10-CM

## 2013-10-24 DIAGNOSIS — I25810 Atherosclerosis of coronary artery bypass graft(s) without angina pectoris: Secondary | ICD-10-CM

## 2013-10-24 DIAGNOSIS — F32A Depression, unspecified: Secondary | ICD-10-CM | POA: Insufficient documentation

## 2013-10-24 DIAGNOSIS — Z23 Encounter for immunization: Secondary | ICD-10-CM

## 2013-10-24 MED ORDER — ESCITALOPRAM OXALATE 20 MG PO TABS: ORAL_TABLET | ORAL | Status: DC

## 2013-10-24 NOTE — Patient Instructions (Addendum)
1 year of lexapro given.   Follow up in 4 months.   Great to get to go through your history.   Flu shot today.

## 2013-10-24 NOTE — Progress Notes (Signed)
Brett Reddish, MD Phone: [phone]  Subjective:  Patient presents today to establish care with me as their new primary care provider. Patient was formerly a patient of Dr. Arnoldo Morale. Chief complaint-noted.   CAD-controlled On aspirin. Followed by cards. Compliant with statin. Recently with stress testing of surgery and did well.  ROS-no chest pain or shortness of breath  Depression Stable on lexapro.  ROS- No SI/HI  Hyperlipidemia-well controlled  Lab Results  Component Value Date   LDLCALC 63 08/08/2011  On statin: yes, crestor 20mg  Regular exercise: no Diet: some poor habits ROS- no chest pain or shortness of breath. No myalgias  The following were reviewed and entered/updated in epic: Past Medical History  Diagnosis Date  . Carotid stenosis     s/p Left CEA, followed by Dr. Donnetta Hutching  . Abdominal aortic aneurysm     [place] 07/2012 -3.5 cm max diameter, no change; followed by Dr. Donnetta Hutching  . Radicular low back pain   . BPH (benign prostatic hypertrophy)   . Aphthous ulcer   . Unspecified adverse effect of unspecified drug, medicinal and biological substance   . GERD (gastroesophageal reflux disease)   . History of nephrolithiasis   . Hyperlipidemia   . CAD (coronary artery disease)     s/p CABG 1993;  cath 10/10: severe native 3v CAD, S-Dx ok, S-OM1/OM2 ok, S-PDA ok, L-LAD ok, EF 60%  . Pre-syncope   . 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 HOH (hard of hearing)     hearing aids  . Memory deficits 06/19/2013  . Throat cancer   . Cervical spondylosis without myelopathy    Patient Active Problem List   Diagnosis Date Noted  . CAD (coronary artery disease) s/p bypass graft 09/14/2006    Priority: High  . Depression 10/24/2013    Priority: Medium  . CKD (chronic kidney disease), stage III 09/28/2013    Priority: Medium  . AAA (abdominal aortic aneurysm) without rupture 07/22/2013    Priority: Medium  . Cervical spondylosis without myelopathy 06/19/2013   Priority: Medium  . Memory deficits 06/19/2013    Priority: Medium  . PVD- hx CEA,patent carotids by doppler July 2015 09/26/2010    Priority: Medium  . BPH (benign prostatic hyperplasia) 11/25/2007    Priority: Medium  . Hyperlipidemia 09/14/2006    Priority: Medium  . Postoperative hematoma 10/01/2013    Priority: Low  . Right inguinal hernia 09/26/2013    Priority: Low  . Polyarticular osteoarthritis 08/08/2010    Priority: Low  . Low back pain potentially associated with radiculopathy 03/19/2008    Priority: Low  . GERD 10/24/2006    Priority: Low  . NEPHROLITHIASIS, HX OF 09/14/2006    Priority: Low   Past Surgical History  Procedure Laterality Date  . Coronary artery bypass graft    . Coronary artery bypass graft  1998  . Cholecystectomy    . Prostate surgery    . Back surgery    . Renal stone surgery      3 times  . Throat surgery    . Cardiac catheterization    . Carotid endarterectomy Left   . Hernia repair      unclear which side  . Inguinal hernia repair Right 09/29/2013    Procedure: HERNIA REPAIR INGUINAL ADULT;  Surgeon: Rolm Bookbinder, MD;  Location: [place];  Service: General;  Laterality: Right;  . Insertion of mesh Right 09/29/2013    Procedure: INSERTION OF MESH;  Surgeon:  Rolm Bookbinder, MD;  Location: [place];  Service: General;  Laterality: Right;    Family History  Problem Relation Age of Onset  . Coronary artery disease    . Heart disease Father   . Stroke Father   . Stroke Brother   . Coronary artery disease Brother   . Heart disease Brother     Medications- reviewed and updated Current Outpatient Prescriptions  Medication Sig Dispense Refill  . aspirin EC 81 MG tablet Take 81 mg by mouth 3 (three) times a week.      . B Complex Vitamins (VITAMIN B COMPLEX PO) Take 50 mg by mouth every Monday, Wednesday, and Friday.       . escitalopram (LEXAPRO) 20 MG tablet TAKE ONE TABLET BY MOUTH ONCE DAILY  90 tablet  3  . gabapentin (NEURONTIN)  100 MG capsule Take 200 mg by mouth 2 (two) times daily.      . rosuvastatin (CRESTOR) 20 MG tablet Take 10 mg by mouth every other day. Monday Wednesday and Friday      . vitamin E (VITAMIN E) 400 UNIT capsule Take 400 Units by mouth every other day.       Marland Kitchen acetaminophen (TYLENOL) 500 MG tablet Take 1,000 mg by mouth daily as needed for mild pain.      . ALPHAGAN P 0.1 % SOLN Place 1 drop into both eyes daily.       No current facility-administered medications for this visit.    Allergies-reviewed and updated Allergies  Allergen Reactions  . Avinza [Morphine Sulfate] Other (See Comments)    Altered mental status. "about killed him"  . Doxycycline Calcium Other (See Comments)    Causes patient chronic colitis; was hospitalized two weeks due to med.  . Vibramycin [Doxycycline Hyclate] Other (See Comments)    Causes patient colitis; was hospitalized two weeks due to med  . Statins Other (See Comments)    Muscle pain; can tolerate crestor 10 mg every other day  . Codeine Sulfate Nausea And Vomiting  . Oxycodone Other (See Comments)    Angry; altered mental status; "wild and crazy"    History   Social History  . Marital Status: Married    Spouse Name: N/A    Number of Children: 3  . Years of Education: 11th   Occupational History  . Retired    Social History Main Topics  . Smoking status: Former Smoker -- 1.00 packs/day for 30 years    Types: Cigarettes    Quit date: 01/17/1972  . Smokeless tobacco: Never Used  . Alcohol Use: No  . Drug Use: No  . Sexual Activity: Not Currently   Other Topics Concern  . None   Social History Narrative   Married to Ms. Sako who is patient of Dr. Yong Channel. Lives with wife. Married 1953       3 girls (1 is a patient here-[name]). 2 grandkids.       Retired from [employer]: mowing the yard, time with wife, church sparingly          ROS--See HPI   Objective: BP 102/70  Temp(Src) 98.1 F (36.7 C)  Wt 171 lb  (77.565 kg) Gen: NAD, uncomfortable due to neck pain from known cervical disc disease-time for dosing of gabapentin for afternoon.  HEENT: Mucous membranes are moist. Oropharynx normal.  CV: RRR no murmurs rubs or gallops Lungs: CTAB no crackles, wheeze, rhonchi Abdomen: soft/nontender/nondistended/normal bowel sounds.  Ext: no  edema Skin: warm, dry, no rash Neuro: interacts with conversation at times, needs repitition  Assessment/Plan:  Hyperlipidemia On max dose statin. Routine monitoring of LDL not required but could check with next bloodwork to assess compliance. Continue crestor 20mg   Depression Well controlled on lexapro 20mg . Refilled today.   CAD (coronary artery disease) s/p bypass graft Patient did have a hematoma when hospitalized after surgery but has had no recurrence. Restart aspirin at this time with warning signs for return. Continue crestor. Continue cardiology follow up.   Meds ordered this encounter  Medications  . escitalopram (LEXAPRO) 20 MG tablet    Sig: TAKE ONE TABLET BY MOUTH ONCE DAILY    Dispense:  90 tablet    Refill:  3

## 2013-10-24 NOTE — Assessment & Plan Note (Signed)
On max dose statin. Routine monitoring of LDL not required but could check with next bloodwork to assess compliance. Continue crestor 20mg 

## 2013-10-24 NOTE — Assessment & Plan Note (Signed)
Patient did have a hematoma when hospitalized after surgery but has had no recurrence. Restart aspirin at this time with warning signs for return. Continue crestor. Continue cardiology follow up.

## 2013-10-24 NOTE — Assessment & Plan Note (Signed)
Well controlled on lexapro 20mg . Refilled today.

## 2013-10-27 ENCOUNTER — Encounter: Payer: Self-pay | Admitting: Adult Health

## 2013-10-27 ENCOUNTER — Ambulatory Visit (INDEPENDENT_AMBULATORY_CARE_PROVIDER_SITE_OTHER): Payer: Medicare Other | Admitting: Adult Health

## 2013-10-27 VITALS — BP 88/53 | HR 69 | Ht 70.0 in | Wt 171.0 lb

## 2013-10-27 DIAGNOSIS — R413 Other amnesia: Secondary | ICD-10-CM | POA: Diagnosis not present

## 2013-10-27 DIAGNOSIS — I2510 Atherosclerotic heart disease of native coronary artery without angina pectoris: Secondary | ICD-10-CM | POA: Diagnosis not present

## 2013-10-27 DIAGNOSIS — M47812 Spondylosis without myelopathy or radiculopathy, cervical region: Secondary | ICD-10-CM | POA: Diagnosis not present

## 2013-10-27 MED ORDER — GABAPENTIN 100 MG PO CAPS: 200.0000 mg | ORAL_CAPSULE | Freq: Three times a day (TID) | ORAL | Status: DC
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
## 2013-10-27 NOTE — Progress Notes (Signed)
I have read the note, and I agree with the clinical assessment and plan.  Rowynn Mcweeney KEITH   

## 2014-02-24 ENCOUNTER — Encounter: Payer: Self-pay | Admitting: Family Medicine

## 2014-02-24 ENCOUNTER — Ambulatory Visit (INDEPENDENT_AMBULATORY_CARE_PROVIDER_SITE_OTHER): Payer: Medicare Other | Admitting: Family Medicine

## 2014-02-24 DIAGNOSIS — N183 Chronic kidney disease, stage 3 unspecified: Secondary | ICD-10-CM

## 2014-02-24 DIAGNOSIS — E785 Hyperlipidemia, unspecified: Secondary | ICD-10-CM

## 2014-02-24 DIAGNOSIS — F329 Major depressive disorder, single episode, unspecified: Secondary | ICD-10-CM

## 2014-02-24 DIAGNOSIS — F32A Depression, unspecified: Secondary | ICD-10-CM

## 2014-02-24 NOTE — Assessment & Plan Note (Signed)
Continue crestor 20mg . Check lipids today. Hopeful at least <100 if not <70 with CAD history.

## 2014-02-24 NOTE — Assessment & Plan Note (Signed)
Controlled today. Mood and activity level has increased. Continue lexapro 20mg . No si/hi.

## 2014-02-24 NOTE — Assessment & Plan Note (Signed)
GFR ranges 50-70. BP/dizziness prevents ace-i. Check bmet today.

## 2014-02-24 NOTE — Progress Notes (Signed)
Garret Reddish, MD Phone: [phone]  Subjective:   [name] is a 81 y.o. year old very pleasant male patient who presents for follow up of depression, hyperlipidemia, and CKD stage II-III.   Patient and wife state mood has been much better. He is tolerating lexapro 20mg . He has gone to church last 6 weeks. His hyperlipidemia has done well historically on crestor 20mg  without recent lipids checked. His CKD has been largely stable with GFR ranging from 50-70 and not clear if CKD stage II or III at this point. Due to blood pressure, cannot tolerate ace-i.   ROS- no si/hi. No chest pain or shortness of breath. Does have polyuria and nocturia but cannot tolerate flomax due to dizziness. No oliguria. Still has some neck pain but manageable.   Past Medical History- Patient Active Problem List   Diagnosis Date Noted  . CAD (coronary artery disease) s/p bypass graft 09/14/2006    Priority: High  . Depression 10/24/2013    Priority: Medium  . CKD (chronic kidney disease), stage III 09/28/2013    Priority: Medium  . AAA (abdominal aortic aneurysm) without rupture 07/22/2013    Priority: Medium  . Cervical spondylosis without myelopathy 06/19/2013    Priority: Medium  . Memory deficits 06/19/2013    Priority: Medium  . PVD- hx CEA,patent carotids by doppler July 2015 09/26/2010    Priority: Medium  . BPH (benign prostatic hyperplasia) 11/25/2007    Priority: Medium  . Hyperlipidemia 09/14/2006    Priority: Medium  . Postoperative hematoma 10/01/2013    Priority: Low  . Right inguinal hernia 09/26/2013    Priority: Low  . Polyarticular osteoarthritis 08/08/2010    Priority: Low  . Low back pain potentially associated with radiculopathy 03/19/2008    Priority: Low  . GERD 10/24/2006    Priority: Low  . NEPHROLITHIASIS, HX OF 09/14/2006    Priority: Low   Medications- reviewed and updated Current Outpatient Prescriptions  Medication Sig Dispense Refill  . aspirin EC 81 MG  tablet Take 81 mg by mouth 3 (three) times a week.    . B Complex Vitamins (VITAMIN B COMPLEX PO) Take 50 mg by mouth every Monday, Wednesday, and Friday.     . escitalopram (LEXAPRO) 20 MG tablet TAKE ONE TABLET BY MOUTH ONCE DAILY 90 tablet 3  . gabapentin (NEURONTIN) 100 MG capsule Take 2 capsules (200 mg total) by mouth 3 (three) times daily. 180 capsule 4  . rosuvastatin (CRESTOR) 20 MG tablet Take 10 mg by mouth every other day. Monday Wednesday and Friday    . vitamin E (VITAMIN E) 400 UNIT capsule Take 400 Units by mouth every other day.     . ALPHAGAN P 0.1 % SOLN Place 1 drop into both eyes daily.     No current facility-administered medications for this visit.    Objective: BP 102/68 mmHg  Temp(Src) 98.1 F (36.7 C)  Wt 177 lb (80.287 kg) Gen: NAD, resting comfortably CV: RRR no murmurs rubs or gallops Lungs: CTAB no crackles, wheeze, rhonchi Abdomen: soft/nontender/nondistended/normal bowel sounds.  Ext: no edema Skin: warm, dry, no rash   Assessment/Plan:  Depression Controlled today. Mood and activity level has increased. Continue lexapro 20mg . No si/hi.    CKD (chronic kidney disease), stage III GFR ranges 50-70. BP/dizziness prevents ace-i. Check bmet today.    Hyperlipidemia Continue crestor 20mg . Check lipids today. Hopeful at least <100 if not <70 with CAD history.     Return precautions advised. 6 month  follow up if labs stable.   Orders Placed This Encounter  Procedures  . LDL cholesterol, direct    [place]  . Basic metabolic panel

## 2014-02-24 NOTE — Patient Instructions (Addendum)
Labs today for cholesterol and kidneys  No changes otherwise  6 months

## 2014-02-26 ENCOUNTER — Telehealth: Payer: Self-pay | Admitting: Family Medicine

## 2014-02-26 NOTE — Telephone Encounter (Signed)
Please advise 

## 2014-02-26 NOTE — Telephone Encounter (Signed)
Pt wife would like her hus to have bmp lab work only. Can I just sch bmp. Theres order for chole also.

## 2014-02-26 NOTE — Telephone Encounter (Signed)
Pt wife transferred to [facility] to schedule lab appt

## 2014-02-26 NOTE — Telephone Encounter (Signed)
Let's review chart together today before you leave

## 2014-02-27 ENCOUNTER — Ambulatory Visit (INDEPENDENT_AMBULATORY_CARE_PROVIDER_SITE_OTHER): Payer: Medicare Other | Admitting: Adult Health

## 2014-02-27 ENCOUNTER — Encounter: Payer: Self-pay | Admitting: Adult Health

## 2014-02-27 ENCOUNTER — Other Ambulatory Visit: Payer: Medicare Other

## 2014-02-27 VITALS — BP 127/66 | HR 57 | Ht 70.0 in | Wt 175.0 lb

## 2014-02-27 DIAGNOSIS — M47812 Spondylosis without myelopathy or radiculopathy, cervical region: Secondary | ICD-10-CM

## 2014-02-27 DIAGNOSIS — R413 Other amnesia: Secondary | ICD-10-CM | POA: Diagnosis not present

## 2014-02-27 LAB — LDL CHOLESTEROL, DIRECT: Direct LDL: 71 mg/dL

## 2014-02-27 LAB — BASIC METABOLIC PANEL
BUN: 19 mg/dL (ref 6–23)
CHLORIDE: 107 meq/L (ref 96–112)
CO2: 29 meq/L (ref 19–32)
Calcium: 8.8 mg/dL (ref 8.4–10.5)
Creatinine, Ser: 1.53 mg/dL — ABNORMAL HIGH (ref 0.40–1.50)
GFR: 46.69 mL/min — ABNORMAL LOW (ref >60.00)
GLUCOSE: 76 mg/dL (ref 70–99)
Potassium: 3.9 mEq/L (ref 3.5–5.1)
SODIUM: 142 meq/L (ref 135–145)

## 2014-02-27 MED ORDER — MEMANTINE HCL 28 X 5 MG & 21 X 10 MG PO TABS: ORAL_TABLET | ORAL | Status: DC

## 2014-02-27 NOTE — Patient Instructions (Addendum)
Begin Namenda.  Memory score is 19/30. Will continue to follow.  Continue gabapentin for the neck pain.   Memantine Tablets What is this medicine? MEMANTINE (MEM an teen) is used to treat dementia caused by Alzheimer's disease. This medicine may be used for other purposes; ask your health care provider or pharmacist if you have questions. COMMON BRAND NAME(S): Namenda What should I tell my health care provider before I take this medicine? They need to know if you have any of these conditions: -difficulty passing urine -kidney disease -liver disease -seizures -an unusual or allergic reaction to memantine, other medicines, foods, dyes, or preservatives -pregnant or trying to get pregnant -breast-feeding How should I use this medicine? Take this medicine by mouth with a glass of water. Follow the directions on the prescription label. You may take this medicine with or without food. Take your doses at regular intervals. Do not take your medicine more often than directed. Continue to take your medicine even if you feel better. Do not stop taking except on the advice of your doctor or health care professional. Talk to your pediatrician regarding the use of this medicine in children. Special care may be needed. Overdosage: If you think you have taken too much of this medicine contact a poison control center or emergency room at once. NOTE: This medicine is only for you. Do not share this medicine with others. What if I miss a dose? If you miss a dose, take it as soon as you can. If it is almost time for your next dose, take only that dose. Do not take double or extra doses. If you do not take your medicine for several days, contact your health care provider. Your dose may need to be changed. What may interact with this medicine? -acetazolamide -amantadine -cimetidine -dextromethorphan -dofetilide -hydrochlorothiazide -ketamine -metformin -methazolamide -quinidine -ranitidine -sodium  bicarbonate -triamterene This list may not describe all possible interactions. Give your health care provider a list of all the medicines, herbs, non-prescription drugs, or dietary supplements you use. Also tell them if you smoke, drink alcohol, or use illegal drugs. Some items may interact with your medicine. What should I watch for while using this medicine? Visit your doctor or health care professional for regular checks on your progress. Check with your doctor or health care professional if there is no improvement in your symptoms or if they get worse. You may get drowsy or dizzy. Do not drive, use machinery, or do anything that needs mental alertness until you know how this drug affects you. Do not stand or sit up quickly, especially if you are an older patient. This reduces the risk of dizzy or fainting spells. Alcohol can make you more drowsy and dizzy. Avoid alcoholic drinks. What side effects may I notice from receiving this medicine? Side effects that you should report to your doctor or health care professional as soon as possible: -allergic reactions like skin rash, itching or hives, swelling of the face, lips, or tongue -agitation or a feeling of restlessness -depressed mood -dizziness -hallucinations -redness, blistering, peeling or loosening of the skin, including inside the mouth -seizures -vomiting Side effects that usually do not require medical attention (report to your doctor or health care professional if they continue or are bothersome): -constipation -diarrhea -headache -nausea -trouble sleeping This list may not describe all possible side effects. Call your doctor for medical advice about side effects. You may report side effects to FDA at 1-800-FDA-1088. Where should I keep my medicine? Keep out of  the reach of children. Store at room temperature between 15 degrees and 30 degrees C (59 degrees and 86 degrees F). Throw away any unused medicine after the expiration  date. NOTE: This sheet is a summary. It may not cover all possible information. If you have questions about this medicine, talk to your doctor, pharmacist, or health care provider.  2015, [employer]vier/Gold Standard. (2012-10-21 14:10:42)

## 2014-02-27 NOTE — Progress Notes (Signed)
I have read the note, and I agree with the clinical assessment and plan.  Dallan Schonberg KEITH   
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
## 2014-03-26 ENCOUNTER — Telehealth: Payer: Self-pay | Admitting: *Deleted

## 2014-03-26 MED ORDER — MEMANTINE HCL 10 MG PO TABS: 10.0000 mg | ORAL_TABLET | Freq: Two times a day (BID) | ORAL | Status: DC

## 2014-03-26 NOTE — Telephone Encounter (Signed)
Patient wife is calling stating that the patient is starting to become dizzy on the memantine. Please call the wife because she is not sure if the patient should continue to increase the dosage. Please advise. [phone]

## 2014-03-26 NOTE — Telephone Encounter (Signed)
I called the patient and spoke to his wife. She states the patient has been complaining of dizziness however this has been ongoing with his neck pain. She did ask the patient while we were on the phone if he felt that the dizziness had gotten worse once he started the [place]. The patient states that his dizziness is no worse. For now we will continue the Namenda I will call in the maintenance dose Namenda 10 mg twice a day. The wife will continue to monitor his dizziness. He denies any falls. If she feels that it is worse with this medication we will consider discontinuing Namenda. She verbalizes understanding.

## 2014-03-31 NOTE — Telephone Encounter (Signed)
Patient's spouse and stated Rx memantine (NAMENDA) 10 MG tablet making patient stagger and afraid he may fall.  Not tolerating medication well and questioning what to do.  Please call home # [phone] and if not available, try cell # [phone].  Stated patient doesn't hear well, so do not leave message with spouse on home #.

## 2014-04-01 NOTE — Telephone Encounter (Signed)
I called the patient. He was experiencing dizziness. The wife thinks is related to the [place]. She has stopped this medication. I have advised that he should remain off this medication  she will let me know if his dizziness improves.

## 2014-05-21 DIAGNOSIS — H538 Other visual disturbances: Secondary | ICD-10-CM | POA: Diagnosis not present

## 2014-05-21 DIAGNOSIS — H5340 Unspecified visual field defects: Secondary | ICD-10-CM | POA: Diagnosis not present

## 2014-05-21 DIAGNOSIS — H3531 Nonexudative age-related macular degeneration: Secondary | ICD-10-CM | POA: Diagnosis not present

## 2014-05-21 DIAGNOSIS — H47012 Ischemic optic neuropathy, left eye: Secondary | ICD-10-CM | POA: Diagnosis not present

## 2014-07-28 ENCOUNTER — Ambulatory Visit (HOSPITAL_COMMUNITY)
Admission: RE | Admit: 2014-07-28 | Discharge: 2014-07-28 | Disposition: A | Discharge status: Home / Self Care | Payer: Medicare Other | Source: Ambulatory Visit | Attending: Vascular Surgery | Admitting: Vascular Surgery

## 2014-07-28 ENCOUNTER — Ambulatory Visit (INDEPENDENT_AMBULATORY_CARE_PROVIDER_SITE_OTHER)
Admission: RE | Admit: 2014-07-28 | Discharge: 2014-07-28 | Disposition: A | Discharge status: Home / Self Care | Payer: Medicare Other | Source: Ambulatory Visit | Attending: Vascular Surgery | Admitting: Vascular Surgery

## 2014-07-28 ENCOUNTER — Other Ambulatory Visit: Payer: Self-pay | Admitting: Vascular Surgery

## 2014-07-28 DIAGNOSIS — Z48812 Encounter for surgical aftercare following surgery on the circulatory system: Secondary | ICD-10-CM | POA: Diagnosis not present

## 2014-07-28 DIAGNOSIS — I6523 Occlusion and stenosis of bilateral carotid arteries: Secondary | ICD-10-CM | POA: Diagnosis not present

## 2014-07-28 DIAGNOSIS — I714 Abdominal aortic aneurysm, without rupture, unspecified: Secondary | ICD-10-CM

## 2014-07-28 DIAGNOSIS — I708 Atherosclerosis of other arteries: Secondary | ICD-10-CM | POA: Diagnosis not present

## 2014-07-31 ENCOUNTER — Encounter: Payer: Self-pay | Admitting: Family

## 2014-08-04 ENCOUNTER — Ambulatory Visit (INDEPENDENT_AMBULATORY_CARE_PROVIDER_SITE_OTHER): Payer: Medicare Other | Admitting: Family

## 2014-08-04 ENCOUNTER — Encounter: Payer: Self-pay | Admitting: Family

## 2014-08-04 VITALS — BP 126/82 | HR 78 | Temp 97.5°F | Resp 16 | Ht 71.0 in | Wt 172.0 lb

## 2014-08-04 DIAGNOSIS — I499 Cardiac arrhythmia, unspecified: Secondary | ICD-10-CM

## 2014-08-04 DIAGNOSIS — I714 Abdominal aortic aneurysm, without rupture, unspecified: Secondary | ICD-10-CM

## 2014-08-04 DIAGNOSIS — Z9889 Other specified postprocedural states: Secondary | ICD-10-CM | POA: Diagnosis not present

## 2014-08-04 DIAGNOSIS — I6523 Occlusion and stenosis of bilateral carotid arteries: Secondary | ICD-10-CM

## 2014-08-04 NOTE — Patient Instructions (Addendum)
Stroke Prevention Some medical conditions and behaviors are associated with an increased chance of having a stroke. You may prevent a stroke by making healthy choices and managing medical conditions. HOW CAN I REDUCE MY RISK OF HAVING A STROKE?   Stay physically active. Get at least 30 minutes of activity on most or all days.  Do not smoke. It may also be helpful to avoid exposure to secondhand smoke.  Limit alcohol use. Moderate alcohol use is considered to be:  No more than 2 drinks per day for men.  No more than 1 drink per day for nonpregnant women.  Eat healthy foods. This involves:  Eating 5 or more servings of fruits and vegetables a day.  Making dietary changes that address high blood pressure (hypertension), high cholesterol, diabetes, or obesity.  Manage your cholesterol levels.  Making food choices that are high in fiber and low in saturated fat, trans fat, and cholesterol may control cholesterol levels.  Take any prescribed medicines to control cholesterol as directed by your health care provider.  Manage your diabetes.  Controlling your carbohydrate and sugar intake is recommended to manage diabetes.  Take any prescribed medicines to control diabetes as directed by your health care provider.  Control your hypertension.  Making food choices that are low in salt (sodium), saturated fat, trans fat, and cholesterol is recommended to manage hypertension.  Take any prescribed medicines to control hypertension as directed by your health care provider.  Maintain a healthy weight.  Reducing calorie intake and making food choices that are low in sodium, saturated fat, trans fat, and cholesterol are recommended to manage weight.  Stop drug abuse.  Avoid taking birth control pills.  Talk to your health care provider about the risks of taking birth control pills if you are over 35 years old, smoke, get migraines, or have ever had a blood clot.  Get evaluated for sleep  disorders (sleep apnea).  Talk to your health care provider about getting a sleep evaluation if you snore a lot or have excessive sleepiness.  Take medicines only as directed by your health care provider.  For some people, aspirin or blood thinners (anticoagulants) are helpful in reducing the risk of forming abnormal blood clots that can lead to stroke. If you have the irregular heart rhythm of atrial fibrillation, you should be on a blood thinner unless there is a good reason you cannot take them.  Understand all your medicine instructions.  Make sure that other conditions (such as anemia or atherosclerosis) are addressed. SEEK IMMEDIATE MEDICAL CARE IF:   You have sudden weakness or numbness of the face, arm, or leg, especially on one side of the body.  Your face or eyelid droops to one side.  You have sudden confusion.  You have trouble speaking (aphasia) or understanding.  You have sudden trouble seeing in one or both eyes.  You have sudden trouble walking.  You have dizziness.  You have a loss of balance or coordination.  You have a sudden, severe headache with no known cause.  You have new chest pain or an irregular heartbeat. Any of these symptoms may represent a serious problem that is an emergency. Do not wait to see if the symptoms will go away. Get medical help at once. Call your local emergency services (911 in [place] Do not drive yourself to the hospital. Document Released: 02/10/2004 Document Revised: 05/19/2013 Document Reviewed: 07/05/2012 ExitCare Patient Information 2015 [employer]. This information is not intended to replace advice given   to you by your health care provider. Make sure you discuss any questions you have with your health care provider.   Abdominal Aortic Aneurysm An aneurysm is a weakened or damaged part of an artery wall that bulges from the normal force of blood pumping through the body. An abdominal aortic aneurysm is an aneurysm that  occurs in the lower part of the aorta, the main artery of the body.  The major concern with an abdominal aortic aneurysm is that it can enlarge and burst (rupture) or blood can flow between the layers of the wall of the aorta through a tear (aorticdissection). Both of these conditions can cause bleeding inside the body and can be life threatening unless diagnosed and treated promptly. CAUSES  The exact cause of an abdominal aortic aneurysm is unknown. Some contributing factors are:   A hardening of the arteries caused by the buildup of fat and other substances in the lining of a blood vessel (arteriosclerosis).  Inflammation of the walls of an artery (arteritis).   Connective tissue diseases, such as Marfan syndrome.   Abdominal trauma.   An infection, such as syphilis or staphylococcus, in the wall of the aorta (infectious aortitis) caused by bacteria. RISK FACTORS  Risk factors that contribute to an abdominal aortic aneurysm may include:  Age older than 58 years.   High blood pressure (hypertension).  Male gender.  Ethnicity (white race).  Obesity.  Family history of aneurysm (first degree relatives only).  Tobacco use. PREVENTION  The following healthy lifestyle habits may help decrease your risk of abdominal aortic aneurysm:  Quitting smoking. Smoking can raise your blood pressure and cause arteriosclerosis.  Limiting or avoiding alcohol.  Keeping your blood pressure, blood sugar level, and cholesterol levels within normal limits.  Decreasing your salt intake. In somepeople, too much salt can raise blood pressure and increase your risk of abdominal aortic aneurysm.  Eating a diet low in saturated fats and cholesterol.  Increasing your fiber intake by including whole grains, vegetables, and fruits in your diet. Eating these foods may help lower blood pressure.  Maintaining a healthy weight.  Staying physically active and exercising regularly. SYMPTOMS  The  symptoms of abdominal aortic aneurysm may vary depending on the size and rate of growth of the aneurysm.Most grow slowly and do not have any symptoms. When symptoms do occur, they may include:  Pain (abdomen, side, lower back, or groin). The pain may vary in intensity. A sudden onset of severe pain may indicate that the aneurysm has ruptured.  Feeling full after eating only small amounts of food.  Nausea or vomiting or both.  Feeling a pulsating lump in the abdomen.  Feeling faint or passing out. DIAGNOSIS  Since most unruptured abdominal aortic aneurysms have no symptoms, they are often discovered during diagnostic exams for other conditions. An aneurysm may be found during the following procedures:  Ultrasonography (A one-time screening for abdominal aortic aneurysm by ultrasonography is also recommended for all men aged 5-75 years who have ever smoked).  X-ray exams.  A computed tomography (CT).  Magnetic resonance imaging (MRI).  Angiography or arteriography. TREATMENT  Treatment of an abdominal aortic aneurysm depends on the size of your aneurysm, your age, and risk factors for rupture. Medication to control blood pressure and pain may be used to manage aneurysms smaller than 6 cm. Regular monitoring for enlargement may be recommended by your caregiver if:  The aneurysm is 3-4 cm in size (an annual ultrasonography may be recommended).  The  The aneurysm is 4-4.5 cm in size (an ultrasonography every 6 months may be recommended).  The aneurysm is larger than 4.5 cm in size (your caregiver may ask that you be examined by a vascular surgeon). If your aneurysm is larger than 6 cm, surgical repair may be recommended. There are two main methods for repair of an aneurysm:   Endovascular repair (a minimally invasive surgery). This is done most often.  Open repair. This method is used if an endovascular repair is not possible. Document Released: 10/12/2004 Document Revised: 04/29/2012  Document Reviewed: 02/02/2012 ExitCare Patient Information 2015 [employer]. This information is not intended to replace advice given to you by your health care provider. Make sure you discuss any questions you have with your health care provider.  
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
## 2014-08-05 ENCOUNTER — Ambulatory Visit (INDEPENDENT_AMBULATORY_CARE_PROVIDER_SITE_OTHER): Payer: Medicare Other | Admitting: Nurse Practitioner

## 2014-08-05 ENCOUNTER — Encounter: Payer: Self-pay | Admitting: Nurse Practitioner

## 2014-08-05 VITALS — BP 120/72 | HR 75 | Ht 71.0 in | Wt 173.4 lb

## 2014-08-05 DIAGNOSIS — I25810 Atherosclerosis of coronary artery bypass graft(s) without angina pectoris: Secondary | ICD-10-CM | POA: Diagnosis not present

## 2014-08-05 DIAGNOSIS — I498 Other specified cardiac arrhythmias: Secondary | ICD-10-CM

## 2014-08-05 DIAGNOSIS — I6523 Occlusion and stenosis of bilateral carotid arteries: Secondary | ICD-10-CM

## 2014-08-05 DIAGNOSIS — E785 Hyperlipidemia, unspecified: Secondary | ICD-10-CM

## 2014-08-05 NOTE — Patient Instructions (Signed)
Medication Instructions:  Your physician recommends that you continue on your current medications as directed. Please refer to the Current Medication list given to you today.   Labwork: None   Testing/Procedures: None   Follow-Up: Follow up as planned with Dr.Cooper in October  Any Other Special Instructions Will Be Listed Below (If Applicable).

## 2014-08-05 NOTE — Addendum Note (Signed)
Addended by: Dorthula Rue L on: 08/05/2014 05:11 PM   Modules accepted: Orders
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
## 2014-08-18 ENCOUNTER — Ambulatory Visit: Payer: Medicare Other | Admitting: Family Medicine

## 2014-08-26 ENCOUNTER — Telehealth: Payer: Self-pay | Admitting: Cardiovascular Disease

## 2014-08-26 NOTE — Telephone Encounter (Signed)
New Message   Pt wife is calling, because pt seen Rogelia Mire, NP  08/05/14  And she received a recall letter for his 1 year follow up for Dr. Burt Knack,   She wants a RN to call her back to see if she needs to schedule the yearly appt.

## 2014-08-26 NOTE — Telephone Encounter (Signed)
Spoke with pt's wife and informed her that when looking at pt's AVS, Ignacia Bayley did want pt to keep f/u with Dr. Burt Knack as planned in October. Scheduled pt for next available which was 11/28. Informed wife that I will route this information to Dr. Antionette Char nurse for review and if pt needs a sooner appt then she will contact her to reschedule. Wife verbalized understanding and was in agreement with this plan.

## 2014-08-27 ENCOUNTER — Ambulatory Visit (INDEPENDENT_AMBULATORY_CARE_PROVIDER_SITE_OTHER): Payer: Medicare Other | Admitting: Family Medicine

## 2014-08-27 ENCOUNTER — Encounter: Payer: Self-pay | Admitting: Family Medicine

## 2014-08-27 VITALS — BP 116/82 | HR 50 | Temp 98.5°F | Wt 173.0 lb

## 2014-08-27 DIAGNOSIS — Z23 Encounter for immunization: Secondary | ICD-10-CM | POA: Diagnosis not present

## 2014-08-27 DIAGNOSIS — I25810 Atherosclerosis of coronary artery bypass graft(s) without angina pectoris: Secondary | ICD-10-CM | POA: Diagnosis not present

## 2014-08-27 DIAGNOSIS — N183 Chronic kidney disease, stage 3 unspecified: Secondary | ICD-10-CM

## 2014-08-27 DIAGNOSIS — E785 Hyperlipidemia, unspecified: Secondary | ICD-10-CM | POA: Diagnosis not present

## 2014-08-27 LAB — COMPREHENSIVE METABOLIC PANEL
ALBUMIN: 3.9 g/dL (ref 3.5–5.2)
ALT: 17 U/L (ref 0–53)
AST: 25 U/L (ref 0–37)
Alkaline Phosphatase: 78 U/L (ref 39–117)
BUN: 13 mg/dL (ref 6–23)
CHLORIDE: 105 meq/L (ref 96–112)
CO2: 29 mEq/L (ref 19–32)
Calcium: 8.8 mg/dL (ref 8.4–10.5)
Creatinine, Ser: 1.38 mg/dL (ref 0.40–1.50)
GFR: 52.53 mL/min — ABNORMAL LOW (ref >60.00)
GLUCOSE: 81 mg/dL (ref 70–99)
POTASSIUM: 3.5 meq/L (ref 3.5–5.1)
Sodium: 142 mEq/L (ref 135–145)
Total Bilirubin: 1 mg/dL (ref 0.2–1.2)
Total Protein: 6.5 g/dL (ref 6.0–8.3)

## 2014-08-27 LAB — CBC
HCT: 43.9 % (ref 39.0–52.0)
HEMOGLOBIN: 14.8 g/dL (ref 13.0–17.0)
MCHC: 33.8 g/dL (ref 30.0–36.0)
MCV: 92.6 fl (ref 78.0–100.0)
PLATELETS: 192 10*3/uL (ref 150.0–400.0)
RBC: 4.74 Mil/uL (ref 4.22–5.81)
RDW: 14.5 % (ref 11.5–15.5)
WBC: 7.2 10*3/uL (ref 4.0–10.5)

## 2014-08-27 NOTE — Assessment & Plan Note (Signed)
S: remains active. Asymptomatic. Compliant with aspirin and crestor A/P: continue current rx. F/u Dr. Burt Knack in November.

## 2014-08-27 NOTE — Patient Instructions (Signed)
Medication Instructions:  No changes  Labwork: Before you leave  Testing/Procedures/Immunizations: Prevnar 13  Follow-Up: 6 months

## 2014-08-27 NOTE — Assessment & Plan Note (Signed)
S: mild poor control with last LDL 71 and goal <70 with CAD. Crestor 20mg  MWF, myalgias limit increase but no issues at current dose A/P: discussed increase but patient not interested- one of better balances he has found between myalgias and control

## 2014-08-27 NOTE — Assessment & Plan Note (Signed)
S: cr stable previously, on lipid management, unclear if proteinuric but would not tolerate ace- due to BP and dizziness A/P: GFR stable in 50s today, continue to follow at least every 6 months.

## 2014-08-27 NOTE — Progress Notes (Signed)
Garret Reddish, MD  Subjective:  [name] is a 81 y.o. year old very pleasant male patient who presents with: See problem oriented charting ROS- no chest pain or shortness of breath, no changes to urination pattern  Past Medical History- PVD, memory deficiets, depression, cervical spondylosis, BPH, AAA  Medications- reviewed and updated Current Outpatient Prescriptions  Medication Sig Dispense Refill  . aspirin 81 MG tablet Take 81 mg by mouth daily. Pt takes 1 tablet by mouth on Mon, Wed and Fri.    . B Complex Vitamins (B COMPLEX 50 PO) Take 1 capsule by mouth 3 (three) times a week. Pt takes 1 capsule by mouth on Mon, Wed and Fri.    . escitalopram (LEXAPRO) 20 MG tablet TAKE ONE TABLET BY MOUTH ONCE DAILY 90 tablet 3  . gabapentin (NEURONTIN) 100 MG capsule Take 200 mg by mouth at bedtime.    Marland Kitchen ibuprofen (ADVIL,MOTRIN) 100 MG/5ML suspension Take 200 mg by mouth every 4 (four) hours as needed.    . rosuvastatin (CRESTOR) 20 MG tablet Take 20 mg by mouth 3 (three) times a week. Pt takes 1 tablet by mouth on Mon, Wed and Fri.    . vitamin E 400 UNIT capsule Take 400 Units by mouth 3 (three) times a week. Pt takes 1 capsule by mouth on Mon, Wed and Fri.    . ALPHAGAN P 0.1 % SOLN Place 1 drop into both eyes daily.     Objective: BP 116/82 mmHg  Pulse 50  Temp(Src) 98.5 F (36.9 C)  Wt 173 lb (78.472 kg) Gen: NAD, resting comfortably CV: RRR- known sinus arhythmia varies with HR,  no murmurs rubs or gallops Lungs: CTAB no crackles, wheeze, rhonchi Abdomen: soft/nontender/nondistended/normal bowel sounds. No rebound or guarding.  Ext: no edema Skin: warm, dry, no rash Neuro: grossly normal, moves all extremities   Assessment/Plan:  CAD (coronary artery disease) s/p bypass graft S: remains active. Asymptomatic. Compliant with aspirin and crestor A/P: continue current rx. F/u Dr. Burt Knack in November.    CKD (chronic kidney disease), stage III S: cr stable previously, on  lipid management, unclear if proteinuric but would not tolerate ace- due to BP and dizziness A/P: GFR stable in 50s today, continue to follow at least every 6 months.    Hyperlipidemia S: mild poor control with last LDL 71 and goal <70 with CAD. Crestor 20mg  MWF, myalgias limit increase but no issues at current dose A/P: discussed increase but patient not interested- one of better balances he has found between myalgias and control  6 months  Not fasting to update lipids Orders Placed This Encounter  Procedures  . Pneumococcal conjugate vaccine 13-valent  . CBC    [place]  . Comprehensive metabolic panel    [place]

## 2014-08-28 ENCOUNTER — Telehealth: Payer: Self-pay | Admitting: Family Medicine

## 2014-08-28 MED ORDER — ROSUVASTATIN CALCIUM 20 MG PO TABS: 20.0000 mg | ORAL_TABLET | ORAL | Status: DC

## 2014-08-28 NOTE — Telephone Encounter (Signed)
Refill request for Crestor 10 mg take 1 po qd and send to [facility].

## 2014-08-28 NOTE — Telephone Encounter (Signed)
Mediation refilled.

## 2014-08-31 ENCOUNTER — Ambulatory Visit: Payer: Medicare Other | Admitting: Neurology

## 2014-09-03 ENCOUNTER — Encounter: Payer: Self-pay | Admitting: Neurology

## 2014-09-03 ENCOUNTER — Ambulatory Visit (INDEPENDENT_AMBULATORY_CARE_PROVIDER_SITE_OTHER): Payer: Medicare Other | Admitting: Neurology

## 2014-09-03 VITALS — BP 94/66 | HR 72 | Ht 71.0 in | Wt 169.0 lb

## 2014-09-03 DIAGNOSIS — I6523 Occlusion and stenosis of bilateral carotid arteries: Secondary | ICD-10-CM | POA: Diagnosis not present

## 2014-09-03 DIAGNOSIS — R413 Other amnesia: Secondary | ICD-10-CM | POA: Diagnosis not present

## 2014-09-03 DIAGNOSIS — M47812 Spondylosis without myelopathy or radiculopathy, cervical region: Secondary | ICD-10-CM | POA: Diagnosis not present

## 2014-09-03 MED ORDER — DONEPEZIL HCL 5 MG PO TABS: 5.0000 mg | ORAL_TABLET | Freq: Every day | ORAL | Status: DC

## 2014-09-03 NOTE — Patient Instructions (Signed)
   Begin Aricept (donepezil) at 5 mg at night for one month. If this medication is well-tolerated, please call our office and we will call in a prescription for the 10 mg tablets. Look out for side effects that may include nausea, diarrhea, weight loss, or stomach cramps. This medication will also cause a runny nose, therefore there is no need for allergy medications for this purpose.  

## 2014-09-03 NOTE — Progress Notes (Signed)
Reason for visit: Memory disturbance  [name] is an 81 y.o. male  History of present illness:  [name] is an 81 year old right-handed white male with a history of a progressive memory disturbance. The patient apparently is still operates a motor vehicle, he is restricted to a 15 mile range around his house secondary to visual loss in the left eye. The patient continues to have neck discomfort, he takes 2 of the 100 mg gabapentin at night, this allows him to sleep fairly well. The patient has not had any other significant medical issues since last seen. He could not tolerate Namenda secondary to dizziness, and he had to stop the medication. He is very restricted in what he will eat, he only will eat hot dogs and hamburgers, his wife has difficulty getting him to take in any other nutrition. He returns to this office or an evaluation.  Past Medical History  Diagnosis Date  . Carotid stenosis     a. s/p Left CEA, followed by Dr. Donnetta Hutching;  b. 07/2014 u/s: RICA < 20, LICA patent.  . Abdominal aortic aneurysm     a. 07/2014 Abd U/S: 3.61 cm, RCIA >50d.  . Radicular low back pain   . BPH (benign prostatic hypertrophy)   . Aphthous ulcer   . Unspecified adverse effect of unspecified drug, medicinal and biological substance   . GERD (gastroesophageal reflux disease)   . History of nephrolithiasis   . Hyperlipidemia   . CAD (coronary artery disease)     a. s/p CABG 1993;  b. cath 10/10: severe native 3v CAD, S-Dx ok, S-OM1/OM2 ok, S-PDA ok, L-LAD ok, EF 60%.  . Pre-syncope   . 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 HOH (hard of hearing)     hearing aids  . Memory deficits 06/19/2013  . Throat cancer   . Cervical spondylosis without myelopathy     Past Surgical History  Procedure Laterality Date  . Coronary artery bypass graft    . Coronary artery bypass graft  1998  . Cholecystectomy    . Prostate surgery    . Back surgery    . Renal stone surgery      3 times    . Throat surgery    . Cardiac catheterization    . Carotid endarterectomy Left   . Hernia repair      unclear which side  . Inguinal hernia repair Right 09/29/2013    Procedure: HERNIA REPAIR INGUINAL ADULT;  Surgeon: Rolm Bookbinder, MD;  Location: [facility];  Service: General;  Laterality: Right;  . Insertion of mesh Right 09/29/2013    Procedure: INSERTION OF MESH;  Surgeon: Rolm Bookbinder, MD;  Location: [facility];  Service: General;  Laterality: Right;    Family History  Problem Relation Age of Onset  . Coronary artery disease    . Heart disease Father   . Stroke Father   . Stroke Brother   . Coronary artery disease Brother   . Heart disease Brother     Social history:  reports that he quit smoking about 42 years ago. His smoking use included Cigarettes. He has a 30 pack-year smoking history. His smokeless tobacco use includes Chew. He reports that he does not drink alcohol or use illicit drugs.    Allergies  Allergen Reactions  . Avinza [Morphine Sulfate] Other (See Comments)    Altered mental status. "about killed him"  . Doxycycline Calcium Other (See Comments)  Causes patient chronic colitis; was hospitalized two weeks due to med.  . Vibramycin [Doxycycline Hyclate] Other (See Comments)    Causes patient colitis; was hospitalized two weeks due to med  . Statins Other (See Comments)    Muscle pain; can tolerate crestor 10 mg every other day  . Codeine Sulfate Nausea And Vomiting  . Oxycodone Other (See Comments)    Angry; altered mental status; "wild and crazy"    Medications:  Prior to Admission medications   Medication Sig Start Date End Date Taking? Authorizing Provider  ALPHAGAN P 0.1 % SOLN Place 1 drop into both eyes daily. 04/24/13  Yes Historical Provider, MD  aspirin 81 MG tablet Take 81 mg by mouth daily. Pt takes 1 tablet by mouth on Mon, Wed and Fri.   Yes Historical Provider, MD  B Complex Vitamins (B COMPLEX 50 PO) Take 1 capsule by mouth 3 (three) times  a week. Pt takes 1 capsule by mouth on Mon, Wed and Fri.   Yes Historical Provider, MD  escitalopram (LEXAPRO) 20 MG tablet TAKE ONE TABLET BY MOUTH ONCE DAILY 10/24/13  Yes Marin Olp, MD  gabapentin (NEURONTIN) 100 MG capsule Take 200 mg by mouth at bedtime.   Yes Historical Provider, MD  ibuprofen (ADVIL,MOTRIN) 100 MG/5ML suspension Take 200 mg by mouth every 4 (four) hours as needed.   Yes Historical Provider, MD  rosuvastatin (CRESTOR) 20 MG tablet Take 1 tablet (20 mg total) by mouth 3 (three) times a week. Pt takes 1 tablet by mouth on Mon, Wed and Fri. 08/28/14  Yes Marin Olp, MD  vitamin E 400 UNIT capsule Take 400 Units by mouth 3 (three) times a week. Pt takes 1 capsule by mouth on Mon, Wed and Fri.   Yes Historical Provider, MD    ROS:  Out of a complete 14 system review of symptoms, the patient complains only of the following symptoms, and all other reviewed systems are negative.  Dizziness Agitation, confusion, anxiety Joint pain Memory disturbance  Blood pressure 94/66, pulse 72, height 5\' 11"  (1.803 m), weight 169 lb (76.658 kg).  Physical Exam  General: The patient is alert and cooperative at the time of the examination.  Skin: No significant peripheral edema is noted.   Neurologic Exam  Mental status: The patient is alert and oriented x 2 at the time of the examination (not oriented to date). The Mini-Mental Status Examination done today shows a total score of 17/30. The patient is able to name 7 animals in 30 seconds.   Cranial nerves: Facial symmetry is present. Speech is raspy, dysarthric. Extraocular movements are full. Visual fields are full.  Motor: The patient has good strength in all 4 extremities.  Sensory examination: Soft touch sensation is symmetric on the face, arms, and legs.  Coordination: The patient has good finger-nose-finger and heel-to-shin bilaterally. The patient has some apraxia with the use of the extremities.  Gait and  station: The patient has a normal gait. Tandem gait is normal. Romberg is negative. No drift is seen.  Reflexes: Deep tendon reflexes are symmetric.   Assessment/Plan:  1. Aggressive memory disturbance  2. Cervical spondylosis  The patient could not tolerate Namenda, he will be placed on Aricept in low dose, they are to contact me if there are any issues with the medication. He will follow-up in 6 months. The driving issue needs to be scrutinized closely, the patient has significant memory issues at this time, the wife indicates that he  is quite safe with driving however. I suspect he will need to stop his driving activities in the near future.  Jill Alexanders MD 09/03/2014 9:05 PM  [facility] [address] [place], [place] [address]  Phone [phone] Fax [phone]

## 2014-10-12 DIAGNOSIS — H903 Sensorineural hearing loss, bilateral: Secondary | ICD-10-CM | POA: Diagnosis not present

## 2014-10-15 ENCOUNTER — Other Ambulatory Visit: Payer: Self-pay | Admitting: Neurology

## 2014-11-23 ENCOUNTER — Other Ambulatory Visit: Payer: Self-pay | Admitting: Adult Health

## 2014-11-23 ENCOUNTER — Other Ambulatory Visit: Payer: Self-pay | Admitting: Family Medicine

## 2014-12-14 ENCOUNTER — Ambulatory Visit: Payer: Medicare Other | Admitting: Cardiovascular Disease

## 2015-03-01 ENCOUNTER — Ambulatory Visit: Payer: Medicare Other | Admitting: Family Medicine

## 2015-03-08 ENCOUNTER — Ambulatory Visit (INDEPENDENT_AMBULATORY_CARE_PROVIDER_SITE_OTHER): Payer: Medicare Other | Admitting: Adult Health

## 2015-03-08 ENCOUNTER — Encounter: Payer: Self-pay | Admitting: Adult Health

## 2015-03-08 VITALS — BP 156/90 | HR 59 | Resp 16 | Ht 71.0 in | Wt 181.0 lb

## 2015-03-08 DIAGNOSIS — F0390 Unspecified dementia without behavioral disturbance: Secondary | ICD-10-CM | POA: Diagnosis not present

## 2015-03-08 MED ORDER — DONEPEZIL HCL 5 MG PO TABS: 5.0000 mg | ORAL_TABLET | Freq: Every day | ORAL | Status: DC

## 2015-03-08 NOTE — Patient Instructions (Signed)
Restart Aricept 5 mg at bedtime  Memory score slightly decreased If your symptoms worsen or you develop new symptoms please let us know.

## 2015-03-08 NOTE — Progress Notes (Signed)
I have read the note, and I agree with the clinical assessment and plan.  [name],[name]   

## 2015-03-08 NOTE — Progress Notes (Signed)
PATIENT: [name] DOB: [birth year]  REASON FOR VISIT: follow up- memory disturbance HISTORY FROM: patient  HISTORY OF PRESENT ILLNESS: [name] is an 82 year old male with a history of progressive memory disturbance. He returns today for follow-up. At the last visit the patient was started on Aricept but his family stopped the medication as they did not see the benefit. Patient does require some assistance with ADLs. Family states that he does not bathe regularly and becomes agitated if they ask him to. The family states that he sleeps on the couch. And he primarily sleeps throughout the day and stays awake at night. The patient does not like to go out much according to the family. He did shower today because he knew he was coming to the doctor's office. The patient has expanded his diet. His family states that he now will eat most meals or they bring him. He does prefer sweets. The family does not feel that his agitation is severe. He no longer operates a Teacher, music. He returns today for an evaluation.  HISTORY 09/03/14 (WILLIS): [name] is an 82 year old right-handed white male with a history of a progressive memory disturbance. The patient apparently is still operates a motor vehicle, he is restricted to a 15 mile range around his house secondary to visual loss in the left eye. The patient continues to have neck discomfort, he takes 2 of the 100 mg gabapentin at night, this allows him to sleep fairly well. The patient has not had any other significant medical issues since last seen. He could not tolerate Namenda secondary to dizziness, and he had to stop the medication. He is very restricted in what he will eat, he only will eat hot dogs and hamburgers, his wife has difficulty getting him to take in any other nutrition. He returns to this office or an evaluation.  REVIEW OF SYSTEMS: Out of a complete 14 system review of symptoms, the patient complains only of the following symptoms, and  all other reviewed systems are negative.  Appetite change, chills, daytime sleepiness, back pain, neck pain, neck stiffness, behavior problem, confusion, decreased concentration, depression, memory loss, dizziness  ALLERGIES: Allergies  Allergen Reactions  . Avinza [Morphine Sulfate] Other (See Comments)    Altered mental status. "about killed him"  . Doxycycline Calcium Other (See Comments)    Causes patient chronic colitis; was hospitalized two weeks due to med.  . Vibramycin [Doxycycline Hyclate] Other (See Comments)    Causes patient colitis; was hospitalized two weeks due to med  . Statins Other (See Comments)    Muscle pain; can tolerate crestor 10 mg every other day  . Codeine Sulfate Nausea And Vomiting  . Oxycodone Other (See Comments)    Angry; altered mental status; "wild and crazy"    HOME MEDICATIONS: Outpatient Prescriptions Prior to Visit  Medication Sig Dispense Refill  . ALPHAGAN P 0.1 % SOLN Place 1 drop into both eyes daily.    Marland Kitchen aspirin 81 MG tablet Take 81 mg by mouth daily. Pt takes 1 tablet by mouth on Mon, Wed and Fri.    . B Complex Vitamins (B COMPLEX 50 PO) Take 1 capsule by mouth 3 (three) times a week. Pt takes 1 capsule by mouth on Mon, Wed and Fri.    . escitalopram (LEXAPRO) 20 MG tablet TAKE ONE TABLET BY MOUTH ONCE DAILY. 90 tablet 2  . gabapentin (NEURONTIN) 100 MG capsule TAKE TWO CAPSULES BY MOUTH THREE TIMES DAILY (Patient taking differently:  Takes 3 caps at night) 180 capsule 3  . ibuprofen (ADVIL,MOTRIN) 100 MG/5ML suspension Take 200 mg by mouth every 4 (four) hours as needed.    . rosuvastatin (CRESTOR) 20 MG tablet Take 1 tablet (20 mg total) by mouth 3 (three) times a week. Pt takes 1 tablet by mouth on Mon, Wed and Fri. 90 tablet 2  . vitamin E 400 UNIT capsule Take 400 Units by mouth 3 (three) times a week. Pt takes 1 capsule by mouth on Mon, Wed and Fri.    . donepezil (ARICEPT) 5 MG tablet TAKE ONE TABLET BY MOUTH AT BEDTIME 30 tablet 1     No facility-administered medications prior to visit.    PAST MEDICAL HISTORY: Past Medical History  Diagnosis Date  . Carotid stenosis     a. s/p Left CEA, followed by Dr. Donnetta Hutching;  b. 07/2014 u/s: RICA < 20, LICA patent.  . Abdominal aortic aneurysm ([place])     a. 07/2014 Abd U/S: 3.61 cm, RCIA >50d.  . Radicular low back pain   . BPH (benign prostatic hypertrophy)   . Aphthous ulcer   . Unspecified adverse effect of unspecified drug, medicinal and biological substance   . GERD (gastroesophageal reflux disease)   . History of nephrolithiasis   . Hyperlipidemia   . CAD (coronary artery disease)     a. s/p CABG 1993;  b. cath 10/10: severe native 3v CAD, S-Dx ok, S-OM1/OM2 ok, S-PDA ok, L-LAD ok, EF 60%.  . Pre-syncope   . 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 HOH (hard of hearing)     hearing aids  . Memory deficits 06/19/2013  . Throat cancer ([place])   . Cervical spondylosis without myelopathy     PAST SURGICAL HISTORY: Past Surgical History  Procedure Laterality Date  . Coronary artery bypass graft    . Coronary artery bypass graft  1998  . Cholecystectomy    . Prostate surgery    . Back surgery    . Renal stone surgery      3 times  . Throat surgery    . Cardiac catheterization    . Carotid endarterectomy Left   . Hernia repair      unclear which side  . Inguinal hernia repair Right 09/29/2013    Procedure: HERNIA REPAIR INGUINAL ADULT;  Surgeon: Rolm Bookbinder, MD;  Location: [facility];  Service: General;  Laterality: Right;  . Insertion of mesh Right 09/29/2013    Procedure: INSERTION OF MESH;  Surgeon: Rolm Bookbinder, MD;  Location: [facility];  Service: General;  Laterality: Right;    FAMILY HISTORY: Family History  Problem Relation Age of Onset  . Coronary artery disease    . Heart disease Father   . Stroke Father   . Stroke Brother   . Coronary artery disease Brother   . Heart disease Brother     SOCIAL HISTORY: Social History   Social  History  . Marital Status: Married    Spouse Name: Everlene Farrier   . Number of Children: 3  . Years of Education: 11th   Occupational History  . Retired    Social History Main Topics  . Smoking status: Former Smoker -- 1.00 packs/day for 30 years    Types: Cigarettes    Quit date: 01/17/1972  . Smokeless tobacco: Current User    Types: Chew  . Alcohol Use: No  . Drug Use: No  . Sexual Activity: Not Currently  Other Topics Concern  . Not on file   Social History Narrative   Married to Ms. Boehm who is patient of Dr. Yong Channel. Lives with wife. Married 1953       3 girls (1 is a patient here-[name]). 2 grandkids.       Retired from [employer]: mowing the yard, time with wife, church sparingly      Patient drinks about 4-5 sodas daily.   Patient is right handed.      PHYSICAL EXAM  Filed Vitals:   03/08/15 1040  BP: 156/90  Pulse: 59  Resp: 16  Height: 5\' 11"  (1.803 m)  Weight: 181 lb (82.101 kg)   Body mass index is 25.26 kg/(m^2).  Generalized: Well developed, in no acute distress   Neurological examination  Mentation: Alert oriented to time, place, history taking. Follows all commands speech and language fluent Cranial nerve II-XII: Pupils were equal round reactive to light. Extraocular movements were full, visual field were full on confrontational test. Facial sensation and strength were normal. Uvula tongue midline. Head turning and shoulder shrug  were normal and symmetric. Motor: The motor testing reveals 5 over 5 strength of all 4 extremities. Good symmetric motor tone is noted throughout.  Sensory: Sensory testing is intact to soft touch on all 4 extremities. No evidence of extinction is noted.  Coordination: Cerebellar testing reveals good finger-nose-finger and heel-to-shin bilaterally.  Gait and station: Gait is normal. Tandem gait is normal. Romberg is negative. No drift is seen.  Reflexes: Deep tendon reflexes are symmetric and normal  bilaterally.   DIAGNOSTIC DATA (LABS, IMAGING, TESTING) - I reviewed patient records, labs, notes, testing and imaging myself where available.  Lab Results  Component Value Date   WBC 7.2 08/27/2014   HGB 14.8 08/27/2014   HCT 43.9 08/27/2014   MCV 92.6 08/27/2014   PLT 192.0 08/27/2014      Component Value Date/Time   NA 142 08/27/2014 1105   K 3.5 08/27/2014 1105   CL 105 08/27/2014 1105   CO2 29 08/27/2014 1105   GLUCOSE 81 08/27/2014 1105   BUN 13 08/27/2014 1105   CREATININE 1.38 08/27/2014 1105   CALCIUM 8.8 08/27/2014 1105   PROT 6.5 08/27/2014 1105   ALBUMIN 3.9 08/27/2014 1105   AST 25 08/27/2014 1105   ALT 17 08/27/2014 1105   ALKPHOS 78 08/27/2014 1105   BILITOT 1.0 08/27/2014 1105   GFRNONAA 59* 10/02/2013 0348   GFRAA 69* 10/02/2013 0348   Lab Results  Component Value Date   CHOL 134 08/08/2011   HDL 48.20 08/08/2011   LDLCALC 63 08/08/2011   LDLDIRECT 71.0 02/27/2014   TRIG 114.0 08/08/2011   CHOLHDL 3 08/08/2011       ASSESSMENT AND PLAN 82 y.o. year old male  has a past medical history of Carotid stenosis; Abdominal aortic aneurysm ([place]); Radicular low back pain; BPH (benign prostatic hypertrophy); Aphthous ulcer; Unspecified adverse effect of unspecified drug, medicinal and biological substance; GERD (gastroesophageal reflux disease); History of nephrolithiasis; Hyperlipidemia; CAD (coronary artery disease); Pre-syncope; Unilateral blindness; HOH (hard of hearing); Memory deficits (06/19/2013); Throat cancer ([place]); and Cervical spondylosis without myelopathy. here with:   1. Memory disturbance  Isolated to the patient's family that they would not see a big change in his mentation after starting Aricept. I explained that the medication is used to preserve the memory not restorative. They verbalized understanding. The patient will restart this medication. He will begin with Aricept 5  mg at bedtime. After a month if he is tolerating this well we can  increase to 10 mg at bedtime. The patient's MMSE is 15/30. We will continue to monitor his memory. Encouraged the family to try to engage the patient in activities throughout the day. They perhaps may even find the PACE program of benefit. They verbalized understanding. If his symptoms worsen or he develops any new symptoms he should let us know. He will follow-up in 6 months or sooner if needed.    Ward Givens, MSN, NP-C 03/08/2015, 10:44 AM [facility] [address] [place], [place] [address] [phone]

## 2015-03-09 ENCOUNTER — Encounter: Payer: Self-pay | Admitting: Family Medicine

## 2015-03-09 ENCOUNTER — Ambulatory Visit (INDEPENDENT_AMBULATORY_CARE_PROVIDER_SITE_OTHER): Payer: Medicare Other | Admitting: Family Medicine

## 2015-03-09 VITALS — BP 104/70 | HR 62 | Temp 98.7°F | Wt 182.0 lb

## 2015-03-09 DIAGNOSIS — N183 Chronic kidney disease, stage 3 unspecified: Secondary | ICD-10-CM

## 2015-03-09 DIAGNOSIS — I25810 Atherosclerosis of coronary artery bypass graft(s) without angina pectoris: Secondary | ICD-10-CM

## 2015-03-09 DIAGNOSIS — E785 Hyperlipidemia, unspecified: Secondary | ICD-10-CM | POA: Diagnosis not present

## 2015-03-09 LAB — CBC
HCT: 45.9 % (ref 39.0–52.0)
Hemoglobin: 15.1 g/dL (ref 13.0–17.0)
MCHC: 32.9 g/dL (ref 30.0–36.0)
MCV: 92.9 fl (ref 78.0–100.0)
Platelets: 245 10*3/uL (ref 150.0–400.0)
RBC: 4.94 Mil/uL (ref 4.22–5.81)
RDW: 15.2 % (ref 11.5–15.5)
WBC: 7.7 10*3/uL (ref 4.0–10.5)

## 2015-03-09 LAB — COMPREHENSIVE METABOLIC PANEL
ALBUMIN: 4 g/dL (ref 3.5–5.2)
ALT: 17 U/L (ref 0–53)
AST: 24 U/L (ref 0–37)
Alkaline Phosphatase: 81 U/L (ref 39–117)
BUN: 14 mg/dL (ref 6–23)
CHLORIDE: 108 meq/L (ref 96–112)
CO2: 31 meq/L (ref 19–32)
CREATININE: 1.35 mg/dL (ref 0.40–1.50)
Calcium: 8.9 mg/dL (ref 8.4–10.5)
GFR: 53.8 mL/min — ABNORMAL LOW (ref >60.00)
GLUCOSE: 82 mg/dL (ref 70–99)
Potassium: 4.7 mEq/L (ref 3.5–5.1)
SODIUM: 143 meq/L (ref 135–145)
Total Bilirubin: 1.1 mg/dL (ref 0.2–1.2)
Total Protein: 6.2 g/dL (ref 6.0–8.3)

## 2015-03-09 LAB — LDL CHOLESTEROL, DIRECT: LDL DIRECT: 70 mg/dL

## 2015-03-09 NOTE — Progress Notes (Signed)
Garret Reddish, MD  Subjective:  [name] is a 82 y.o. year old very pleasant male patient who presents for/with See problem oriented charting ROS- No chest pain or shortness of breath. No headache or blurry vision. Does have memory difficulties nad seeing neurology  Past Medical History-  Patient Active Problem List   Diagnosis Date Noted  . CAD (coronary artery disease) s/p bypass graft 09/14/2006    Priority: High  . Depression 10/24/2013    Priority: Medium  . CKD (chronic kidney disease), stage III 09/28/2013    Priority: Medium  . AAA (abdominal aortic aneurysm) without rupture ([place]) 07/22/2013    Priority: Medium  . Cervical spondylosis without myelopathy 06/19/2013    Priority: Medium  . Memory deficits 06/19/2013    Priority: Medium  . PVD- hx CEA,patent carotids by doppler July 2015 09/26/2010    Priority: Medium  . BPH (benign prostatic hyperplasia) 11/25/2007    Priority: Medium  . Hyperlipidemia 09/14/2006    Priority: Medium  . Postoperative hematoma 10/01/2013    Priority: Low  . Right inguinal hernia 09/26/2013    Priority: Low  . Polyarticular osteoarthritis 08/08/2010    Priority: Low  . Low back pain potentially associated with radiculopathy 03/19/2008    Priority: Low  . GERD 10/24/2006    Priority: Low  . NEPHROLITHIASIS, HX OF 09/14/2006    Priority: Low    Medications- reviewed and updated Current Outpatient Prescriptions  Medication Sig Dispense Refill  . ALPHAGAN P 0.1 % SOLN Place 1 drop into both eyes daily.    Marland Kitchen aspirin 81 MG tablet Take 81 mg by mouth daily. Pt takes 1 tablet by mouth on Mon, Wed and Fri.    . B Complex Vitamins (B COMPLEX 50 PO) Take 1 capsule by mouth 3 (three) times a week. Pt takes 1 capsule by mouth on Mon, Wed and Fri.    . donepezil (ARICEPT) 5 MG tablet Take 1 tablet (5 mg total) by mouth at bedtime. 30 tablet 5  . escitalopram (LEXAPRO) 20 MG tablet TAKE ONE TABLET BY MOUTH ONCE DAILY. 90 tablet 2  .  gabapentin (NEURONTIN) 100 MG capsule TAKE TWO CAPSULES BY MOUTH THREE TIMES DAILY (Patient taking differently: Takes 3 caps at night) 180 capsule 3  . ibuprofen (ADVIL,MOTRIN) 100 MG/5ML suspension Take 200 mg by mouth every 4 (four) hours as needed.    . rosuvastatin (CRESTOR) 20 MG tablet Take 1 tablet (20 mg total) by mouth 3 (three) times a week. Pt takes 1 tablet by mouth on Mon, Wed and Fri. 90 tablet 2  . vitamin E 400 UNIT capsule Take 400 Units by mouth 3 (three) times a week. Pt takes 1 capsule by mouth on Mon, Wed and Fri.     No current facility-administered medications for this visit.    Objective: BP 104/70 mmHg  Pulse 62  Temp(Src) 98.7 F (37.1 C)  Wt 182 lb (82.555 kg) Gen: NAD, resting comfortably, very hard of hearing CV: RRR no murmurs rubs or gallops Lungs: CTAB no crackles, wheeze, rhonchi Abdomen: soft/nontender/nondistended/normal bowel sounds.  Ext: no edema Skin: warm, dry Neuro: grossly normal, moves all extremities  Assessment/Plan:  CAD (coronary artery disease) s/p bypass graft S: asymptomatic. Compliant with aspirin and crestor A/P: continue current therapy- see Dr. Burt Knack later this year   CKD (chronic kidney disease), stage III S: GFR has been stable around 50.  A/P: continue BP control. Update GFr today   Hyperlipidemia S: reasonably controlled on  Crestor 20mg  MWF. No myalgias.  Lab Results  Component Value Date   CHOL 134 08/08/2011   HDL 48.20 08/08/2011   LDLCALC 63 08/08/2011   LDLDIRECT 71.0 02/27/2014   TRIG 114.0 08/08/2011   CHOLHDL 3 08/08/2011   A/P:  myalgias limit increase though would prefer LDL <70. Continue current dose   Counseled on- sleeping at night, awake in day, try to sleep in bed and use pillow, stop eating sweets (buys $20 a week of sweets), weight creeping up- do not let trend continue. Daughters very supportive in this effort  Return in about 6 months (around 09/06/2015). Return precautions advised.   Did  not eat this morning, states water only Orders Placed This Encounter  Procedures  . CBC    [place]  . Comprehensive metabolic panel    [place]  . LDL cholesterol, direct

## 2015-03-09 NOTE — Assessment & Plan Note (Signed)
S: reasonably controlled on Crestor 20mg  MWF. No myalgias.  Lab Results  Component Value Date   CHOL 134 08/08/2011   HDL 48.20 08/08/2011   LDLCALC 63 08/08/2011   LDLDIRECT 71.0 02/27/2014   TRIG 114.0 08/08/2011   CHOLHDL 3 08/08/2011   A/P:  myalgias limit increase though would prefer LDL <70. Continue current dose

## 2015-03-09 NOTE — Patient Instructions (Signed)
Labs before you leave  Biggest things: 1. Eat some healthier foods like fruits/veggies. Put down the sweets! Your weight is up over 10 lbs 2. Sleep at night 3. Sleep in a bed! Sleeping on the couch is not good for you rneck

## 2015-03-09 NOTE — Assessment & Plan Note (Signed)
S: asymptomatic. Compliant with aspirin and crestor A/P: continue current therapy- see Dr. Burt Knack later this year

## 2015-03-09 NOTE — Assessment & Plan Note (Signed)
S: GFR has been stable around 50.  A/P: continue BP control. Update GFr today

## 2015-03-31 ENCOUNTER — Ambulatory Visit: Payer: Medicare Other | Admitting: Cardiovascular Disease

## 2015-06-07 ENCOUNTER — Other Ambulatory Visit: Payer: Self-pay | Admitting: Internal Medicine

## 2015-06-07 MED ORDER — OLANZAPINE 5 MG PO TABS: 5.0000 mg | ORAL_TABLET | Freq: Every evening | ORAL | Status: DC | PRN

## 2015-06-18 ENCOUNTER — Ambulatory Visit: Payer: Medicare Other | Admitting: Cardiovascular Disease

## 2015-06-22 ENCOUNTER — Encounter: Payer: Self-pay | Admitting: Cardiovascular Disease

## 2015-06-25 ENCOUNTER — Other Ambulatory Visit: Payer: Self-pay | Admitting: Family Medicine

## 2015-06-25 ENCOUNTER — Telehealth: Payer: Self-pay | Admitting: Family Medicine

## 2015-06-25 MED ORDER — OLANZAPINE 5 MG PO TABS: 5.0000 mg | ORAL_TABLET | Freq: Every evening | ORAL | Status: DC | PRN

## 2015-06-25 NOTE — Telephone Encounter (Signed)
I have sent in zyprexa for patient. i am also happy to refill the ativan when needed. Very appreciative of Dr. Hilma Favors for helping patient during time of losing his wife.   Will plan to reach out to family in next few weeks to see if referral has gone through.

## 2015-06-25 NOTE — Telephone Encounter (Signed)
-----   Message from Acquanetta Chain, DO sent at 06/25/2015  3:10 AM EDT ----- Regarding: Medication Refills I got a call from [name] daughter today. He is doing exceptionally well with Xyprexa 5mg  QHS and uses 1/2 to 1 tab of the Ativan daily for agitation. My plan is to get home based palliative out there as soon as possible- I need to follow up on the referral- sometimes its not the most efficient system. Are you comfortable prescribing this for him in the meantime? I advised [name] that since you are PCP I would prefer that you handle the prescribing- I am mostly all inpatient which makes it hard to follow-up. Once the home based providers see him i'm sure they would be happy to help with prescribing and oversight. Thanks for making a visit to [place] that night-it meant SO much to that family.Marland Kitchenwe need more of you out there in primary care!!! Let me know how I can help.   [name]

## 2015-08-05 ENCOUNTER — Encounter: Payer: Self-pay | Admitting: Family

## 2015-08-10 ENCOUNTER — Ambulatory Visit (HOSPITAL_COMMUNITY): Payer: Medicare Other

## 2015-08-10 ENCOUNTER — Telehealth: Payer: Self-pay | Admitting: Family Medicine

## 2015-08-10 ENCOUNTER — Ambulatory Visit: Payer: Medicare Other | Admitting: Family

## 2015-08-10 NOTE — Telephone Encounter (Signed)
pts daughter calling to see if Dr. Yong Channel has sent Rx lorazepam (ATIVAN) in for pt (Dr. Yong Channel had spoke with Dr. Hilma Favors per pts daughter that he would be the administrator over that Rx).    Pharm:  [facility].

## 2015-08-11 ENCOUNTER — Other Ambulatory Visit: Payer: Self-pay

## 2015-08-11 MED ORDER — LORAZEPAM 0.5 MG PO TABS: 0.5000 mg | ORAL_TABLET | Freq: Two times a day (BID) | ORAL | 1 refills | Status: DC | PRN

## 2015-08-11 NOTE — Telephone Encounter (Signed)
Spoke with daughter Jackelyn Poling who states her dad is doing much better. Medication keeps him from getting agitated as easily. Prescription sent to [facility]. Daughter Jackelyn Poling to pick up this afternoon

## 2015-08-13 ENCOUNTER — Encounter: Payer: Self-pay | Admitting: Family

## 2015-08-18 ENCOUNTER — Encounter: Payer: Self-pay | Admitting: Family Medicine

## 2015-08-18 ENCOUNTER — Ambulatory Visit (INDEPENDENT_AMBULATORY_CARE_PROVIDER_SITE_OTHER): Payer: Medicare Other | Admitting: Family

## 2015-08-18 ENCOUNTER — Ambulatory Visit (HOSPITAL_COMMUNITY)
Admission: RE | Admit: 2015-08-18 | Discharge: 2015-08-18 | Disposition: A | Discharge status: Home / Self Care | Payer: Medicare Other | Source: Ambulatory Visit | Attending: Family | Admitting: Family

## 2015-08-18 ENCOUNTER — Encounter: Payer: Self-pay | Admitting: Family

## 2015-08-18 ENCOUNTER — Ambulatory Visit (INDEPENDENT_AMBULATORY_CARE_PROVIDER_SITE_OTHER): Payer: Medicare Other | Admitting: Family Medicine

## 2015-08-18 ENCOUNTER — Ambulatory Visit (INDEPENDENT_AMBULATORY_CARE_PROVIDER_SITE_OTHER)
Admission: RE | Admit: 2015-08-18 | Discharge: 2015-08-18 | Disposition: A | Discharge status: Home / Self Care | Payer: Medicare Other | Source: Ambulatory Visit | Attending: Family | Admitting: Family

## 2015-08-18 VITALS — BP 180/103 | HR 60 | Ht 71.0 in | Wt 191.0 lb

## 2015-08-18 VITALS — BP 110/64 | HR 67 | Temp 98.5°F

## 2015-08-18 DIAGNOSIS — K219 Gastro-esophageal reflux disease without esophagitis: Secondary | ICD-10-CM | POA: Diagnosis not present

## 2015-08-18 DIAGNOSIS — IMO0001 Reserved for inherently not codable concepts without codable children: Secondary | ICD-10-CM

## 2015-08-18 DIAGNOSIS — I714 Abdominal aortic aneurysm, without rupture, unspecified: Secondary | ICD-10-CM

## 2015-08-18 DIAGNOSIS — Z48812 Encounter for surgical aftercare following surgery on the circulatory system: Secondary | ICD-10-CM

## 2015-08-18 DIAGNOSIS — I2510 Atherosclerotic heart disease of native coronary artery without angina pectoris: Secondary | ICD-10-CM | POA: Diagnosis not present

## 2015-08-18 DIAGNOSIS — Z9889 Other specified postprocedural states: Secondary | ICD-10-CM

## 2015-08-18 DIAGNOSIS — R413 Other amnesia: Secondary | ICD-10-CM | POA: Diagnosis not present

## 2015-08-18 DIAGNOSIS — I499 Cardiac arrhythmia, unspecified: Secondary | ICD-10-CM | POA: Diagnosis not present

## 2015-08-18 DIAGNOSIS — R030 Elevated blood-pressure reading, without diagnosis of hypertension: Secondary | ICD-10-CM | POA: Diagnosis not present

## 2015-08-18 DIAGNOSIS — E785 Hyperlipidemia, unspecified: Secondary | ICD-10-CM | POA: Insufficient documentation

## 2015-08-18 DIAGNOSIS — I6521 Occlusion and stenosis of right carotid artery: Secondary | ICD-10-CM | POA: Insufficient documentation

## 2015-08-18 DIAGNOSIS — I10 Essential (primary) hypertension: Secondary | ICD-10-CM

## 2015-08-18 DIAGNOSIS — I25810 Atherosclerosis of coronary artery bypass graft(s) without angina pectoris: Secondary | ICD-10-CM | POA: Diagnosis not present

## 2015-08-18 DIAGNOSIS — I6523 Occlusion and stenosis of bilateral carotid arteries: Secondary | ICD-10-CM | POA: Diagnosis not present

## 2015-08-18 DIAGNOSIS — I739 Peripheral vascular disease, unspecified: Secondary | ICD-10-CM

## 2015-08-18 LAB — VAS US CAROTID
LCCAPDIAS: 12 cm/s
LCCAPSYS: 73 cm/s
LEFT ECA DIAS: -6 cm/s
LEFT VERTEBRAL DIAS: 9 cm/s
LICAPDIAS: 6 cm/s
LICAPSYS: 31 cm/s
Left CCA dist dias: -12 cm/s
Left CCA dist sys: -84 cm/s
Left ICA dist dias: -15 cm/s
Left ICA dist sys: -61 cm/s
RIGHT CCA MID DIAS: 10 cm/s
RIGHT ECA DIAS: -7 cm/s
RIGHT VERTEBRAL DIAS: 6 cm/s
Right CCA prox dias: -9 cm/s
Right CCA prox sys: -61 cm/s

## 2015-08-18 NOTE — Patient Instructions (Signed)
Stroke Prevention Some medical conditions and behaviors are associated with an increased chance of having a stroke. You may prevent a stroke by making healthy choices and managing medical conditions. HOW CAN I REDUCE MY RISK OF HAVING A STROKE?   Stay physically active. Get at least 30 minutes of activity on most or all days.  Do not smoke. It may also be helpful to avoid exposure to secondhand smoke.  Limit alcohol use. Moderate alcohol use is considered to be:  No more than 2 drinks per day for men.  No more than 1 drink per day for nonpregnant women.  Eat healthy foods. This involves:  Eating 5 or more servings of fruits and vegetables a day.  Making dietary changes that address high blood pressure (hypertension), high cholesterol, diabetes, or obesity.  Manage your cholesterol levels.  Making food choices that are high in fiber and low in saturated fat, trans fat, and cholesterol may control cholesterol levels.  Take any prescribed medicines to control cholesterol as directed by your health care provider.  Manage your diabetes.  Controlling your carbohydrate and sugar intake is recommended to manage diabetes.  Take any prescribed medicines to control diabetes as directed by your health care provider.  Control your hypertension.  Making food choices that are low in salt (sodium), saturated fat, trans fat, and cholesterol is recommended to manage hypertension.  Ask your health care provider if you need treatment to lower your blood pressure. Take any prescribed medicines to control hypertension as directed by your health care provider.  If you are 18-39 years of age, have your blood pressure checked every 3-5 years. If you are 40 years of age or older, have your blood pressure checked every year.  Maintain a healthy weight.  Reducing calorie intake and making food choices that are low in sodium, saturated fat, trans fat, and cholesterol are recommended to manage  weight.  Stop drug abuse.  Avoid taking birth control pills.  Talk to your health care provider about the risks of taking birth control pills if you are over 35 years old, smoke, get migraines, or have ever had a blood clot.  Get evaluated for sleep disorders (sleep apnea).  Talk to your health care provider about getting a sleep evaluation if you snore a lot or have excessive sleepiness.  Take medicines only as directed by your health care provider.  For some people, aspirin or blood thinners (anticoagulants) are helpful in reducing the risk of forming abnormal blood clots that can lead to stroke. If you have the irregular heart rhythm of atrial fibrillation, you should be on a blood thinner unless there is a good reason you cannot take them.  Understand all your medicine instructions.  Make sure that other conditions (such as anemia or atherosclerosis) are addressed. SEEK IMMEDIATE MEDICAL CARE IF:   You have sudden weakness or numbness of the face, arm, or leg, especially on one side of the body.  Your face or eyelid droops to one side.  You have sudden confusion.  You have trouble speaking (aphasia) or understanding.  You have sudden trouble seeing in one or both eyes.  You have sudden trouble walking.  You have dizziness.  You have a loss of balance or coordination.  You have a sudden, severe headache with no known cause.  You have new chest pain or an irregular heartbeat. Any of these symptoms may represent a serious problem that is an emergency. Do not wait to see if the symptoms will   go away. Get medical help at once. Call your local emergency services (911 in [place] Do not drive yourself to the hospital.   This information is not intended to replace advice given to you by your health care provider. Make sure you discuss any questions you have with your health care provider.   Document Released: 02/10/2004 Document Revised: 01/23/2014 Document Reviewed:  07/05/2012 Elsevier Interactive Patient Education 2016 Elsevier Inc.     Abdominal Aortic Aneurysm An aneurysm is a weakened or damaged part of an artery wall that bulges from the normal force of blood pumping through the body. An abdominal aortic aneurysm is an aneurysm that occurs in the lower part of the aorta, the main artery of the body.  The major concern with an abdominal aortic aneurysm is that it can enlarge and burst (rupture) or blood can flow between the layers of the wall of the aorta through a tear (aorticdissection). Both of these conditions can cause bleeding inside the body and can be life threatening unless diagnosed and treated promptly. CAUSES  The exact cause of an abdominal aortic aneurysm is unknown. Some contributing factors are:   A hardening of the arteries caused by the buildup of fat and other substances in the lining of a blood vessel (arteriosclerosis).  Inflammation of the walls of an artery (arteritis).   Connective tissue diseases, such as Marfan syndrome.   Abdominal trauma.   An infection, such as syphilis or staphylococcus, in the wall of the aorta (infectious aortitis) caused by bacteria. RISK FACTORS  Risk factors that contribute to an abdominal aortic aneurysm may include:  Age older than 60 years.   High blood pressure (hypertension).  Male gender.  Ethnicity (white race).  Obesity.  Family history of aneurysm (first degree relatives only).  Tobacco use. PREVENTION  The following healthy lifestyle habits may help decrease your risk of abdominal aortic aneurysm:  Quitting smoking. Smoking can raise your blood pressure and cause arteriosclerosis.  Limiting or avoiding alcohol.  Keeping your blood pressure, blood sugar level, and cholesterol levels within normal limits.  Decreasing your salt intake. In somepeople, too much salt can raise blood pressure and increase your risk of abdominal aortic aneurysm.  Eating a diet low in  saturated fats and cholesterol.  Increasing your fiber intake by including whole grains, vegetables, and fruits in your diet. Eating these foods may help lower blood pressure.  Maintaining a healthy weight.  Staying physically active and exercising regularly. SYMPTOMS  The symptoms of abdominal aortic aneurysm may vary depending on the size and rate of growth of the aneurysm.Most grow slowly and do not have any symptoms. When symptoms do occur, they may include:  Pain (abdomen, side, lower back, or groin). The pain may vary in intensity. A sudden onset of severe pain may indicate that the aneurysm has ruptured.  Feeling full after eating only small amounts of food.  Nausea or vomiting or both.  Feeling a pulsating lump in the abdomen.  Feeling faint or passing out. DIAGNOSIS  Since most unruptured abdominal aortic aneurysms have no symptoms, they are often discovered during diagnostic exams for other conditions. An aneurysm may be found during the following procedures:  Ultrasonography (A one-time screening for abdominal aortic aneurysm by ultrasonography is also recommended for all men aged 65-75 years who have ever smoked).  X-ray exams.  A computed tomography (CT).  Magnetic resonance imaging (MRI).  Angiography or arteriography. TREATMENT  Treatment of an abdominal aortic aneurysm depends on the size of   your aneurysm, your age, and risk factors for rupture. Medication to control blood pressure and pain may be used to manage aneurysms smaller than 6 cm. Regular monitoring for enlargement may be recommended by your caregiver if:  The aneurysm is 3-4 cm in size (an annual ultrasonography may be recommended).  The aneurysm is 4-4.5 cm in size (an ultrasonography every 6 months may be recommended).  The aneurysm is larger than 4.5 cm in size (your caregiver may ask that you be examined by a vascular surgeon). If your aneurysm is larger than 6 cm, surgical repair may be  recommended. There are two main methods for repair of an aneurysm:   Endovascular repair (a minimally invasive surgery). This is done most often.  Open repair. This method is used if an endovascular repair is not possible.   This information is not intended to replace advice given to you by your health care provider. Make sure you discuss any questions you have with your health care provider.   Document Released: 10/12/2004 Document Revised: 04/29/2012 Document Reviewed: 02/02/2012 Elsevier Interactive Patient Education 2016 Elsevier Inc.      Before your next abdominal ultrasound:  Take two Extra-Strength Gas-X capsules at bedtime the night before the test. Take another two Extra-Strength Gas-X capsules 3 hours before the test.    

## 2015-08-18 NOTE — Progress Notes (Signed)
Pre visit review using our clinic review tool, if applicable. No additional management support is needed unless otherwise documented below in the visit note. Pt unable to weigh 
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
## 2015-08-18 NOTE — Progress Notes (Signed)
   Subjective:    Patient ID: [name], male    DOB: 09/12/1931, 82 y.o.   MRN: [id number]  HPI Here with his daughter to recheck BP. He normally has very stable BP which tend to be on the low side. However during a visit this am to the Vascular Surgery clinic his BP was 205/102 on one occasion and a later recheck was 180/103. He seemed to feel fine at the time and he denied any headache or chest pain or SOB. Of note he was fasting this am from last night and the [place] technician noted that it was very difficult to see anything in his abdomen due to excess gas. She took twice as long as usual to complete the [place]. Also of note his wife , who usually accompanies him on doctor visits, passed away in May 29, 2022. His daughter took him this morning for the first time. As we speak now he says he feels fine. He does have some dementia apparently.    Review of Systems  Constitutional: Negative.   Respiratory: Negative.   Cardiovascular: Negative.   Neurological: Negative.        Objective:   Physical Exam  Constitutional: He appears well-developed and well-nourished. No distress.  Cardiovascular: Normal rate, regular rhythm, normal heart sounds and intact distal pulses.   Pulmonary/Chest: Effort normal and breath sounds normal.  Neurological: He is alert.          Assessment & Plan:  He had very elevated BP readings this am which are back down to normal now. I think this was the result of some anxiety during the visit this am, which took longer than normal and during which he did not have his wife by his side. He seems to be fine now. They will recheck prn.  Laurey Morale, MD

## 2015-08-26 ENCOUNTER — Other Ambulatory Visit: Payer: Self-pay | Admitting: Family Medicine

## 2015-08-27 NOTE — Telephone Encounter (Signed)
Yes thanks, 1 year

## 2015-09-06 ENCOUNTER — Ambulatory Visit: Payer: Medicare Other | Admitting: Adult Health

## 2015-09-06 ENCOUNTER — Ambulatory Visit: Payer: Medicare Other | Admitting: Family Medicine

## 2015-09-13 ENCOUNTER — Ambulatory Visit (INDEPENDENT_AMBULATORY_CARE_PROVIDER_SITE_OTHER): Payer: Medicare Other | Admitting: Family Medicine

## 2015-09-13 ENCOUNTER — Encounter: Payer: Self-pay | Admitting: Family Medicine

## 2015-09-13 VITALS — BP 168/98 | HR 59 | Temp 97.5°F | Wt 198.8 lb

## 2015-09-13 DIAGNOSIS — Z23 Encounter for immunization: Secondary | ICD-10-CM

## 2015-09-13 DIAGNOSIS — R413 Other amnesia: Secondary | ICD-10-CM | POA: Diagnosis not present

## 2015-09-13 DIAGNOSIS — I10 Essential (primary) hypertension: Secondary | ICD-10-CM | POA: Diagnosis not present

## 2015-09-13 DIAGNOSIS — I714 Abdominal aortic aneurysm, without rupture, unspecified: Secondary | ICD-10-CM

## 2015-09-13 DIAGNOSIS — I25810 Atherosclerosis of coronary artery bypass graft(s) without angina pectoris: Secondary | ICD-10-CM

## 2015-09-13 DIAGNOSIS — E785 Hyperlipidemia, unspecified: Secondary | ICD-10-CM | POA: Diagnosis not present

## 2015-09-13 LAB — COMPREHENSIVE METABOLIC PANEL
ALK PHOS: 69 U/L (ref 39–117)
ALT: 15 U/L (ref 0–53)
AST: 19 U/L (ref 0–37)
Albumin: 4.1 g/dL (ref 3.5–5.2)
BILIRUBIN TOTAL: 1 mg/dL (ref 0.2–1.2)
BUN: 11 mg/dL (ref 6–23)
CO2: 30 meq/L (ref 19–32)
Calcium: 8.5 mg/dL (ref 8.4–10.5)
Chloride: 109 mEq/L (ref 96–112)
Creatinine, Ser: 1.12 mg/dL (ref 0.40–1.50)
GFR: 66.66 mL/min (ref >60.00)
GLUCOSE: 89 mg/dL (ref 70–99)
Potassium: 4.4 mEq/L (ref 3.5–5.1)
SODIUM: 146 meq/L — AB (ref 135–145)
TOTAL PROTEIN: 6.4 g/dL (ref 6.0–8.3)

## 2015-09-13 LAB — CBC
HCT: 44.2 % (ref 39.0–52.0)
Hemoglobin: 14.9 g/dL (ref 13.0–17.0)
MCHC: 33.7 g/dL (ref 30.0–36.0)
MCV: 94.4 fl (ref 78.0–100.0)
Platelets: 182 10*3/uL (ref 150.0–400.0)
RBC: 4.68 Mil/uL (ref 4.22–5.81)
RDW: 15.8 % — AB (ref 11.5–15.5)
WBC: 5.8 10*3/uL (ref 4.0–10.5)

## 2015-09-13 LAB — LIPID PANEL
CHOL/HDL RATIO: 4
Cholesterol: 147 mg/dL (ref 0–200)
HDL: 39.7 mg/dL (ref >39.00)
LDL Cholesterol: 81 mg/dL (ref 0–99)
NONHDL: 106.92
Triglycerides: 131 mg/dL (ref 0.0–149.0)
VLDL: 26.2 mg/dL (ref 0.0–40.0)

## 2015-09-13 MED ORDER — AMLODIPINE BESYLATE 5 MG PO TABS: 5.0000 mg | ORAL_TABLET | Freq: Every day | ORAL | 5 refills | Status: DC

## 2015-09-13 MED ORDER — ROSUVASTATIN CALCIUM 20 MG PO TABS: 20.0000 mg | ORAL_TABLET | ORAL | 2 refills | Status: DC

## 2015-09-13 MED ORDER — OLANZAPINE 5 MG PO TABS: 5.0000 mg | ORAL_TABLET | Freq: Every evening | ORAL | 2 refills | Status: DC | PRN

## 2015-09-13 NOTE — Addendum Note (Signed)
Addended by: Marin Olp on: 09/13/2015 10:33 AM   Modules accepted: Orders

## 2015-09-13 NOTE — Assessment & Plan Note (Signed)
S: controlled without meds for some time but recently trend increasing. Had considered ace-I in past for kidneys but family reports poor PO at times BP Readings from Last 3 Encounters:  09/13/15 (!) 168/98  08/18/15 110/64  08/18/15 (!) 180/103  A/P:start amlodipine 5mg  with follow up within 2-3 weeks. Check at home as well. Considered ace-I or hctz but concerned about his fluid intake

## 2015-09-13 NOTE — Assessment & Plan Note (Signed)
S: no complaints- although daughter thinks he does have some fatigue. Compliant with aspirin. Crestor only taking twice a week A/P: update lipids today- continue statin and aspirin- may need to go back to 3x a week statin

## 2015-09-13 NOTE — Patient Instructions (Addendum)
Bloodwork today  High dose flu shot today  start amlodipine 5mg  with follow up within 2-3 weeks. Check at home as well
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
## 2015-09-13 NOTE — Assessment & Plan Note (Signed)
S: well controlled on last check- thought MWF, but daughter states twice a week. No myalgias.  Lab Results  Component Value Date   CHOL 134 08/08/2011   HDL 48.20 08/08/2011   LDLCALC 63 08/08/2011   LDLDIRECT 70.0 03/09/2015   TRIG 114.0 08/08/2011   CHOLHDL 3 08/08/2011   A/P: update full lipid panel- may need to go back to 3x a week.

## 2015-09-13 NOTE — Progress Notes (Signed)
Pre visit review using our clinic review tool, if applicable. No additional management support is needed unless otherwise documented below in the visit note. 
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
## 2015-09-13 NOTE — Assessment & Plan Note (Signed)
S:  Slight increase 08/2015. 3.8 x 3.9 A/P: plan remains 1 year repeat unless velocity of change increases

## 2015-09-15 ENCOUNTER — Telehealth: Payer: Self-pay | Admitting: Family Medicine

## 2015-09-15 NOTE — Telephone Encounter (Signed)
[name], [name], called saying she was returning [name]'s two calls. She said [name] doesn't hear well and [name] needs to reach Brett, she needs to call the following two phone numbers:  Pt's ph# [phone] or [phone] Thank you.

## 2015-09-16 NOTE — Telephone Encounter (Signed)
Called daughter Lynelle Smoke and left a voicemail for a return phone call

## 2015-09-17 NOTE — Telephone Encounter (Signed)
Called daughter Tammy at both numbers listed and left voicemail messages for return phone calls.

## 2015-09-23 ENCOUNTER — Inpatient Hospital Stay (HOSPITAL_COMMUNITY)
Admission: EM | Admit: 2015-09-23 | Discharge: 2015-09-29 | DRG: 071 | Disposition: A | Discharge status: Skilled Nursing Facility | Payer: Medicare Other | Attending: Internal Medicine | Admitting: Internal Medicine

## 2015-09-23 ENCOUNTER — Encounter (HOSPITAL_COMMUNITY): Payer: Self-pay | Admitting: Emergency Medicine

## 2015-09-23 ENCOUNTER — Emergency Department (HOSPITAL_COMMUNITY): Payer: Medicare Other

## 2015-09-23 DIAGNOSIS — Z951 Presence of aortocoronary bypass graft: Secondary | ICD-10-CM

## 2015-09-23 DIAGNOSIS — I739 Peripheral vascular disease, unspecified: Secondary | ICD-10-CM | POA: Diagnosis present

## 2015-09-23 DIAGNOSIS — I2510 Atherosclerotic heart disease of native coronary artery without angina pectoris: Secondary | ICD-10-CM | POA: Diagnosis present

## 2015-09-23 DIAGNOSIS — R410 Disorientation, unspecified: Secondary | ICD-10-CM | POA: Diagnosis not present

## 2015-09-23 DIAGNOSIS — N183 Chronic kidney disease, stage 3 unspecified: Secondary | ICD-10-CM | POA: Diagnosis present

## 2015-09-23 DIAGNOSIS — Z881 Allergy status to other antibiotic agents status: Secondary | ICD-10-CM

## 2015-09-23 DIAGNOSIS — Z888 Allergy status to other drugs, medicaments and biological substances status: Secondary | ICD-10-CM

## 2015-09-23 DIAGNOSIS — I10 Essential (primary) hypertension: Secondary | ICD-10-CM | POA: Diagnosis not present

## 2015-09-23 DIAGNOSIS — K219 Gastro-esophageal reflux disease without esophagitis: Secondary | ICD-10-CM | POA: Diagnosis present

## 2015-09-23 DIAGNOSIS — N400 Enlarged prostate without lower urinary tract symptoms: Secondary | ICD-10-CM | POA: Diagnosis present

## 2015-09-23 DIAGNOSIS — F0390 Unspecified dementia without behavioral disturbance: Secondary | ICD-10-CM | POA: Diagnosis not present

## 2015-09-23 DIAGNOSIS — F329 Major depressive disorder, single episode, unspecified: Secondary | ICD-10-CM | POA: Diagnosis not present

## 2015-09-23 DIAGNOSIS — Z85819 Personal history of malignant neoplasm of unspecified site of lip, oral cavity, and pharynx: Secondary | ICD-10-CM

## 2015-09-23 DIAGNOSIS — E860 Dehydration: Secondary | ICD-10-CM | POA: Diagnosis not present

## 2015-09-23 DIAGNOSIS — K5900 Constipation, unspecified: Secondary | ICD-10-CM

## 2015-09-23 DIAGNOSIS — Z7982 Long term (current) use of aspirin: Secondary | ICD-10-CM

## 2015-09-23 DIAGNOSIS — F05 Delirium due to known physiological condition: Secondary | ICD-10-CM | POA: Diagnosis not present

## 2015-09-23 DIAGNOSIS — F419 Anxiety disorder, unspecified: Secondary | ICD-10-CM | POA: Diagnosis present

## 2015-09-23 DIAGNOSIS — R4182 Altered mental status, unspecified: Secondary | ICD-10-CM | POA: Diagnosis not present

## 2015-09-23 DIAGNOSIS — N390 Urinary tract infection, site not specified: Secondary | ICD-10-CM | POA: Diagnosis present

## 2015-09-23 DIAGNOSIS — G9340 Encephalopathy, unspecified: Secondary | ICD-10-CM | POA: Diagnosis present

## 2015-09-23 DIAGNOSIS — F32A Depression, unspecified: Secondary | ICD-10-CM | POA: Diagnosis present

## 2015-09-23 DIAGNOSIS — R451 Restlessness and agitation: Secondary | ICD-10-CM | POA: Diagnosis present

## 2015-09-23 DIAGNOSIS — E876 Hypokalemia: Secondary | ICD-10-CM | POA: Diagnosis present

## 2015-09-23 DIAGNOSIS — R402411 Glasgow coma scale score 13-15, in the field [EMT or ambulance]: Secondary | ICD-10-CM | POA: Diagnosis not present

## 2015-09-23 DIAGNOSIS — Z87891 Personal history of nicotine dependence: Secondary | ICD-10-CM

## 2015-09-23 DIAGNOSIS — E785 Hyperlipidemia, unspecified: Secondary | ICD-10-CM | POA: Diagnosis present

## 2015-09-23 DIAGNOSIS — Z87442 Personal history of urinary calculi: Secondary | ICD-10-CM

## 2015-09-23 DIAGNOSIS — H5440 Blindness, one eye, unspecified eye: Secondary | ICD-10-CM | POA: Diagnosis present

## 2015-09-23 DIAGNOSIS — Z66 Do not resuscitate: Secondary | ICD-10-CM | POA: Diagnosis present

## 2015-09-23 DIAGNOSIS — I129 Hypertensive chronic kidney disease with stage 1 through stage 4 chronic kidney disease, or unspecified chronic kidney disease: Secondary | ICD-10-CM | POA: Diagnosis present

## 2015-09-23 DIAGNOSIS — Z885 Allergy status to narcotic agent status: Secondary | ICD-10-CM

## 2015-09-23 HISTORY — DX: Chronic kidney disease, stage 3 unspecified: N18.30

## 2015-09-23 HISTORY — DX: Chronic kidney disease, stage 3 (moderate): N18.3

## 2015-09-23 LAB — COMPREHENSIVE METABOLIC PANEL
ALBUMIN: 3.4 g/dL — AB (ref 3.5–5.0)
ALK PHOS: 64 U/L (ref 38–126)
ALT: 18 U/L (ref 17–63)
AST: 24 U/L (ref 15–41)
Anion gap: 10 (ref 5–15)
BUN: 16 mg/dL (ref 6–20)
CALCIUM: 8.3 mg/dL — AB (ref 8.9–10.3)
CHLORIDE: 110 mmol/L (ref 101–111)
CO2: 18 mmol/L — AB (ref 22–32)
CREATININE: 1.27 mg/dL — AB (ref 0.61–1.24)
GFR calc non Af Amer: 51 mL/min — ABNORMAL LOW (ref >60)
GFR, EST AFRICAN AMERICAN: 59 mL/min — AB (ref >60)
GLUCOSE: 109 mg/dL — AB (ref 65–99)
Potassium: 3.7 mmol/L (ref 3.5–5.1)
SODIUM: 138 mmol/L (ref 135–145)
Total Bilirubin: 1.9 mg/dL — ABNORMAL HIGH (ref 0.3–1.2)
Total Protein: 6 g/dL — ABNORMAL LOW (ref 6.5–8.1)

## 2015-09-23 LAB — URINE MICROSCOPIC-ADD ON

## 2015-09-23 LAB — CBC
HCT: 42.3 % (ref 39.0–52.0)
Hemoglobin: 13.8 g/dL (ref 13.0–17.0)
MCH: 31.5 pg (ref 26.0–34.0)
MCHC: 32.6 g/dL (ref 30.0–36.0)
MCV: 96.6 fL (ref 78.0–100.0)
PLATELETS: 173 10*3/uL (ref 150–400)
RBC: 4.38 MIL/uL (ref 4.22–5.81)
RDW: 14.5 % (ref 11.5–15.5)
WBC: 8.5 10*3/uL (ref 4.0–10.5)

## 2015-09-23 LAB — URINALYSIS, ROUTINE W REFLEX MICROSCOPIC
BILIRUBIN URINE: NEGATIVE
GLUCOSE, UA: NEGATIVE mg/dL
Hgb urine dipstick: NEGATIVE
Ketones, ur: NEGATIVE mg/dL
NITRITE: NEGATIVE
PH: 7.5 (ref 5.0–8.0)
Protein, ur: NEGATIVE mg/dL
SPECIFIC GRAVITY, URINE: 1.023 (ref 1.005–1.030)

## 2015-09-23 LAB — CBG MONITORING, ED: Glucose-Capillary: 104 mg/dL — ABNORMAL HIGH (ref 65–99)

## 2015-09-23 LAB — I-STAT CG4 LACTIC ACID, ED
LACTIC ACID, VENOUS: 0.51 mmol/L (ref 0.5–1.9)
Lactic Acid, Venous: 0.64 mmol/L (ref 0.5–1.9)

## 2015-09-23 MED ORDER — LORAZEPAM 2 MG/ML IJ SOLN: 0.5000 mg | Freq: Once | INTRAMUSCULAR | Status: DC

## 2015-09-23 MED ORDER — SODIUM CHLORIDE 0.9 % IV BOLUS (SEPSIS)
1000.0000 mL | Freq: Once | INTRAVENOUS | Status: AC
  Administered 2015-09-23: 1000 mL via INTRAVENOUS

## 2015-09-23 NOTE — ED Triage Notes (Signed)
Pt arrives via EMS from home with increased aloc onset 4PM this afternoon. Hx of dementia, per EMS reports that he started asking more strange questions.

## 2015-09-23 NOTE — ED Notes (Signed)
MD at bedside. 

## 2015-09-23 NOTE — H&P (Signed)
History and Physical    [name] [id number] DOB: [birth year] DOA: 09/23/2015  Referring MD/NP/PA:   PCP: Garret Reddish, MD   Patient coming from:  The patient is coming from home.  At baseline, pt is partially dependent for his ADL.  Chief Complaint: AMS  HPI: [name] is a 82 y.o. male with medical history significant of hypertension, hyperlipidemia, GERD, depression, anxiety, dementia, CAD, s/p of CABG 1998, remote throat cancer, BPH, AAA, CKD-III, who presents with altered mental status.   Per his daughter, pt has been confused in the past 2 days. He is also agitated. Has chronic neck pain, which is at his baseline. Per his daughter, patient moves all extremities normally. No vision change or hearing loss. Daughter states that patient has been more sedentary since 05-30-22 when his wife died. Per his daughter, patient does not have nausea, vomiting, diarrhea, abdominal pain. He has urge to urinate per his daughter. Not sure if he has dysuria or burning on urination.  ED Course: pt was found to have positive urinalysis with small moderate leukocyte, lactate 0.63, WBCs 8.5, temperature 99.7, no tachycardia, no tachypnea, stable renal function, negative CT head of acute intracranial abnormalities. Patient is on telemetry bed for observation.   Review of Systems: Could not be reviewed due to dementia and altered mental status.  Allergy:  Allergies  Allergen Reactions  . Avinza [Morphine Sulfate] Other (See Comments)    Altered mental status. "about killed him"  . Doxycycline Calcium Other (See Comments)    Causes patient chronic colitis; was hospitalized two weeks due to med.  . Vibramycin [Doxycycline Hyclate] Other (See Comments)    Causes patient colitis; was hospitalized two weeks due to med  . Flomax [Tamsulosin Hcl] Other (See Comments)    Dizziness  . Statins Other (See Comments)    Muscle pain; can tolerate crestor 10 mg every other day  . Codeine Sulfate Nausea And  Vomiting  . Oxycodone Other (See Comments)    Angry; altered mental status; "wild and crazy"    Past Medical History:  Diagnosis Date  . Abdominal aortic aneurysm ([place])    a. 07/2014 Abd U/S: 3.61 cm, RCIA >50d.  Marland Kitchen Aphthous ulcer   . BPH (benign prostatic hypertrophy)   . CAD (coronary artery disease)    a. s/p CABG 1993;  b. cath 10/10: severe native 3v CAD, S-Dx ok, S-OM1/OM2 ok, S-PDA ok, L-LAD ok, EF 60%.  . Carotid stenosis    a. s/p Left CEA, followed by Dr. Donnetta Hutching;  b. 07/2014 u/s: RICA < 20, LICA patent.  . Cervical spondylosis without myelopathy   . CKD (chronic kidney disease), stage III   . GERD (gastroesophageal reflux disease)   . History of nephrolithiasis   . HOH (hard of hearing)    hearing aids  . Hyperlipidemia   . Memory deficits 06/19/2013  . Pre-syncope   . Radicular low back pain   . Throat cancer ([place])   . 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 Unspecified adverse effect of unspecified drug, medicinal and biological substance     Past Surgical History:  Procedure Laterality Date  . BACK SURGERY    . CARDIAC CATHETERIZATION    . CAROTID ENDARTERECTOMY Left   . CHOLECYSTECTOMY    . CORONARY ARTERY BYPASS GRAFT    . CORONARY ARTERY BYPASS GRAFT  1998  . HERNIA REPAIR     unclear which side  . INGUINAL HERNIA REPAIR Right  09/29/2013   Procedure: HERNIA REPAIR INGUINAL ADULT;  Surgeon: Rolm Bookbinder, MD;  Location: [facility];  Service: General;  Laterality: Right;  . INSERTION OF MESH Right 09/29/2013   Procedure: INSERTION OF MESH;  Surgeon: Rolm Bookbinder, MD;  Location: [facility];  Service: General;  Laterality: Right;  . PROSTATE SURGERY    . renal stone surgery     3 times  . THROAT SURGERY      Social History:  reports that he quit smoking about 43 years ago. His smoking use included Cigarettes. He has a 30.00 pack-year smoking history. His smokeless tobacco use includes Chew. He reports that he does not drink alcohol or use  drugs.  Family History:  Family History  Problem Relation Age of Onset  . Heart disease Father   . Stroke Father   . AAA (abdominal aortic aneurysm) Sister   . AAA (abdominal aortic aneurysm) Brother   . Stroke Brother   . Coronary artery disease Brother   . Heart disease Brother   . Coronary artery disease       Prior to Admission medications   Medication Sig Start Date End Date Taking? Authorizing Provider  acetaminophen (TYLENOL) 500 MG tablet Take 1,000 mg by mouth 2 (two) times daily.   Yes Historical Provider, MD  ALPHAGAN P 0.1 % SOLN Place 1 drop into both eyes at bedtime.  04/24/13  Yes Historical Provider, MD  amLODipine (NORVASC) 5 MG tablet Take 1 tablet (5 mg total) by mouth daily. 09/13/15  Yes Marin Olp, MD  aspirin 81 MG tablet Take 81 mg by mouth 2 (two) times a week. Pt takes 1 tablet by mouth on Mon and Fri.   Yes Historical Provider, MD  escitalopram (LEXAPRO) 20 MG tablet TAKE ONE TABLET BY MOUTH ONCE DAILY Patient taking differently: TAKE ONE TABLET (20 mg) BY MOUTH ONCE DAILY 08/30/15  Yes Marin Olp, MD  ibuprofen (ADVIL,MOTRIN) 200 MG tablet Take 200 mg by mouth See admin instructions. 200 mg every morning and evening and 200 mg as needed during the day   Yes Historical Provider, MD  LORazepam (ATIVAN) 0.5 MG tablet Take 1 tablet (0.5 mg total) by mouth 2 (two) times daily as needed for anxiety. Patient taking differently: Take 0.5 mg by mouth every morning.  08/11/15  Yes Marin Olp, MD  OLANZapine (ZYPREXA) 5 MG tablet Take 1 tablet (5 mg total) by mouth at bedtime as needed (severe agitation). Patient taking differently: Take 5 mg by mouth at bedtime.  09/13/15  Yes Marin Olp, MD  rosuvastatin (CRESTOR) 20 MG tablet Take 1 tablet (20 mg total) by mouth 3 (three) times a week. Pt takes 1 tablet by mouth on Mon, Wed and Fri. Patient taking differently: Take 10 mg by mouth 2 (two) times a week. Pt takes 1/2 tablet (10 mg) by mouth on Wed and  Sat. 09/13/15  Yes Marin Olp, MD  vitamin E 400 UNIT capsule Take 400 Units by mouth 3 (three) times a week. Pt takes 1 capsule by mouth on Mon, Wed and Fri.   Yes Historical Provider, MD    Physical Exam: Vitals:   09/24/15 0036 09/24/15 0102 09/24/15 0103 09/24/15 0634  BP: 124/80  (!) 146/79 132/66  Pulse: 71  68 60  Resp: 18  18 18   Temp:   98.2 F (36.8 C) 97.8 F (36.6 C)  TempSrc:   Oral   SpO2: 99%  94% 93%  Weight:  86.4  kg (190 lb 7.6 oz)    Height:  6' (1.829 m)     General: Not in acute distress. Dry mucus and membrane HEENT:       Eyes: PERRL, EOMI, no scleral icterus.       ENT: No discharge from the ears and nose, no pharynx injection, no tonsillar enlargement.        Neck: No JVD, no bruit, no mass felt. Heme: No neck lymph node enlargement. Cardiac: S1/S2, RRR, No murmurs, No gallops or rubs. Respiratory: No rales, wheezing, rhonchi or rubs. GI: Soft, nondistended, nontender, no rebound pain, no organomegaly, BS present. GU: No hematuria Ext: has trace  leg edema bilaterally. 2+DP/PT pulse bilaterally. Musculoskeletal: No joint deformities, No joint redness or warmth, no limitation of ROM in spin. Skin: No rashes.  Neuro: confused, not oriented X3, cranial nerves II-XII grossly intact, moves all extremities normally.  Psych: Could not be reviewed due to dementia and altered mental status.   Labs on Admission: I have personally reviewed following labs and imaging studies  CBC:  Recent Labs Lab 09/23/15 1950 09/24/15 0629  WBC 8.5 6.3  HGB 13.8 12.7*  HCT 42.3 39.2  MCV 96.6 96.3  PLT 173 [id number]   Basic Metabolic Panel:  Recent Labs Lab 09/23/15 1950 09/24/15 0629  NA 138 141  K 3.7 3.4*  CL 110 112*  CO2 18* 21*  GLUCOSE 109* 80  BUN 16 16  CREATININE 1.27* 1.14  CALCIUM 8.3* 8.0*   GFR: Estimated Creatinine Clearance: 54.8 mL/min (by C-G formula based on SCr of 1.14 mg/dL). Liver Function Tests:  Recent Labs Lab 09/23/15 1950   AST 24  ALT 18  ALKPHOS 64  BILITOT 1.9*  PROT 6.0*  ALBUMIN 3.4*   No results for input(s): LIPASE, AMYLASE in the last 168 hours. No results for input(s): AMMONIA in the last 168 hours. Coagulation Profile: No results for input(s): INR, PROTIME in the last 168 hours. Cardiac Enzymes: No results for input(s): CKTOTAL, CKMB, CKMBINDEX, TROPONINI in the last 168 hours. BNP (last 3 results) No results for input(s): PROBNP in the last 8760 hours. HbA1C: No results for input(s): HGBA1C in the last 72 hours. CBG:  Recent Labs Lab 09/23/15 1959 09/24/15 0804  GLUCAP 104* 78   Lipid Profile: No results for input(s): CHOL, HDL, LDLCALC, TRIG, CHOLHDL, LDLDIRECT in the last 72 hours. Thyroid Function Tests: No results for input(s): TSH, T4TOTAL, FREET4, T3FREE, THYROIDAB in the last 72 hours. Anemia Panel: No results for input(s): VITAMINB12, FOLATE, FERRITIN, TIBC, IRON, RETICCTPCT in the last 72 hours. Urine analysis:    Component Value Date/Time   COLORURINE YELLOW 09/23/2015 2059   APPEARANCEUR CLEAR 09/23/2015 2059   LABSPEC 1.023 09/23/2015 2059   PHURINE 7.5 09/23/2015 2059   GLUCOSEU NEGATIVE 09/23/2015 2059   HGBUR NEGATIVE 09/23/2015 2059   BILIRUBINUR NEGATIVE 09/23/2015 2059   KETONESUR NEGATIVE 09/23/2015 2059   PROTEINUR NEGATIVE 09/23/2015 2059   UROBILINOGEN 0.2 10/01/2013 1545   NITRITE NEGATIVE 09/23/2015 2059   LEUKOCYTESUR SMALL (A) 09/23/2015 2059   Sepsis Labs: @LABRCNTIP (procalcitonin:4,lacticidven:4) )No results found for this or any previous visit (from the past 240 hour(s)).   Radiological Exams on Admission: Ct Head Wo Contrast  Result Date: 09/23/2015 CLINICAL DATA:  Confusion.  History of carotid stenosis. EXAM: CT HEAD WITHOUT CONTRAST TECHNIQUE: Contiguous axial images were obtained from the base of the skull through the vertex without intravenous contrast. COMPARISON:  Brain MRI 06/27/2013,Head CT 05/19/2010 FINDINGS: Brain: No mass  lesion, intraparenchymal hemorrhage or extra-axial collection. No evidence of acute cortical infarct. There is confluent periventricular hypoattenuation compatible with chronic microvascular disease. Prominence of the ventricles and sulci suggest age-related atrophy. Vascular: There is atherosclerotic calcification of the internal carotid artery is. No hyperdense vessel sign. Skull: Lytic lesion within the right frontal calvarium is unchanged in size, measuring 1.9 by 0.9 cm. Skull is otherwise unremarkable. Sinuses/Orbits: No sinus fluid levels or advanced mucosal thickening. No mastoid effusion. Normal orbits. IMPRESSION: 1. No acute intracranial abnormality. 2. Findings of chronic microvascular disease and moderate atrophy. 3. Unchanged right frontal calvarial lesion. Electronically Signed   By: Ulyses Jarred M.D.   On: 09/23/2015 22:21     EKG: Independently reviewed. Sinus rhythm, QTC 480, PAC, T-wave flattening.  Assessment/Plan Principal Problem:   Acute encephalopathy Active Problems:   Hyperlipidemia   Essential hypertension   CAD (coronary artery disease) s/p bypass graft   PVD- hx CEA,patent carotids by doppler July 2015   CKD (chronic kidney disease), stage III   Depression   UTI (lower urinary tract infection)   Acute encephalopathy: Etiology is not clear. CT head is negative for acute intracranial abnormalities. Patient does not have focal neurologic findings on physical examination, less likely to have stroke. A potential possibility is UTI. Patient has positive urinalysis with small moderate leukocyte, indicating possible UTI. -will place on tele bed for obs -Frequent neurologic -Agitation -PTOT -Treated for possible UTI as below -Observe closely, if patient develops any signs of stroke, will eed to get MRI of her brain  Possible UTI: UA has small amount of leukocytes. Patient has urge to urinate per his daughter, indicating possible UTI -IV Rocephin -Follow-up urine  culture and blood culture  HLD: Last LDL was 81 on 09/13/15 -Continue home medications: Crestor  HTN: -continue amlodipine,  CAD (coronary artery disease) s/p bypass graft: No chest pain.  -continue ASA and Crestor -Check troponin 1  CKD (chronic kidney disease), stage III: Stable. Baseline creatinine 1.2-1.3. His creatinine is 1.27, BUN 16.  -Follow-up renal function by BMP  Depression and anxiety: Stable, no suicidal or homicidal ideations. -Continue home medications: Lexapro and when necessary Ativan   DVT ppx: SQ Lovenox Code Status: Full code Family Communication: Yes, patient's 2 daughters Henrietta Dine, Hilda Blades [phone]) at bed side Disposition Plan:  Anticipate discharge back to previous home environment Consults called:  none Admission status: Obs / tele   Date of Service 09/24/2015    Ivor Costa [facility] Pager [phone]  If 7PM-7AM, please contact night-coverage [email or web address] Password [email or web address] 09/24/2015, 8:11 AM

## 2015-09-23 NOTE — ED Provider Notes (Signed)
[facility] DEPT Provider Note   CSN: [id number] Arrival date & time: 09/23/15  1942     History   Chief Complaint Chief Complaint  Patient presents with  . Altered Mental Status   LEVEL 5 CAVEAT DUE TO ALTERED MENTAL STATUS   HPI [name] is a 82 y.o. male.  The history is provided by the patient and a relative.  Altered Mental Status   This is a new problem. The current episode started 2 days ago. The problem has been gradually worsening. Associated symptoms include confusion and agitation.  Patient with h/o mild dementia presents with increased confusion for past 2 days Per daughters, over past 2 days pt has been more agitated, he has been incontinent.  He is very distractible and speaking incoherently.  At times he will even tell his grown daughter he will drive her to school No fall No fever/vomiting No cough No LOC or seizures He did have BP meds added recently but no other new medications   Past Medical History:  Diagnosis Date  . Abdominal aortic aneurysm ([place])    a. 07/2014 Abd U/S: 3.61 cm, RCIA >50d.  Marland Kitchen Aphthous ulcer   . BPH (benign prostatic hypertrophy)   . CAD (coronary artery disease)    a. s/p CABG 1993;  b. cath 10/10: severe native 3v CAD, S-Dx ok, S-OM1/OM2 ok, S-PDA ok, L-LAD ok, EF 60%.  . Carotid stenosis    a. s/p Left CEA, followed by Dr. Donnetta Hutching;  b. 07/2014 u/s: RICA < 20, LICA patent.  . Cervical spondylosis without myelopathy   . GERD (gastroesophageal reflux disease)   . History of nephrolithiasis   . HOH (hard of hearing)    hearing aids  . Hyperlipidemia   . Memory deficits 06/19/2013  . Pre-syncope   . Radicular low back pain   . Throat cancer ([place])   . 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 Unspecified adverse effect of unspecified drug, medicinal and biological substance     Patient Active Problem List   Diagnosis Date Noted  . Depression 10/24/2013  . Postoperative hematoma 10/01/2013  . CKD (chronic kidney  disease), stage III 09/28/2013  . Right inguinal hernia 09/26/2013  . AAA (abdominal aortic aneurysm) without rupture ([place]) 07/22/2013  . Cervical spondylosis without myelopathy 06/19/2013  . Memory deficits 06/19/2013  . PVD- hx CEA,patent carotids by doppler July 2015 09/26/2010  . 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 Essential hypertension 03/16/2009  . Low back pain potentially associated with radiculopathy 03/19/2008  . BPH (benign prostatic hyperplasia) 11/25/2007  . GERD 10/24/2006  . Hyperlipidemia 09/14/2006  . CAD (coronary artery disease) s/p bypass graft 09/14/2006  . NEPHROLITHIASIS, HX OF 09/14/2006    Past Surgical History:  Procedure Laterality Date  . BACK SURGERY    . CARDIAC CATHETERIZATION    . CAROTID ENDARTERECTOMY Left   . CHOLECYSTECTOMY    . CORONARY ARTERY BYPASS GRAFT    . CORONARY ARTERY BYPASS GRAFT  1998  . HERNIA REPAIR     unclear which side  . INGUINAL HERNIA REPAIR Right 09/29/2013   Procedure: HERNIA REPAIR INGUINAL ADULT;  Surgeon: Rolm Bookbinder, MD;  Location: [facility];  Service: General;  Laterality: Right;  . INSERTION OF MESH Right 09/29/2013   Procedure: INSERTION OF MESH;  Surgeon: Rolm Bookbinder, MD;  Location: [facility];  Service: General;  Laterality: Right;  . PROSTATE SURGERY    . renal stone surgery     3  times  . THROAT SURGERY         Home Medications    Prior to Admission medications   Medication Sig Start Date End Date Taking? Authorizing Provider  acetaminophen (TYLENOL) 500 MG tablet Take 1,000 mg by mouth 2 (two) times daily.   Yes Historical Provider, MD  ALPHAGAN P 0.1 % SOLN Place 1 drop into both eyes at bedtime.  04/24/13  Yes Historical Provider, MD  amLODipine (NORVASC) 5 MG tablet Take 1 tablet (5 mg total) by mouth daily. 09/13/15  Yes Marin Olp, MD  aspirin 81 MG tablet Take 81 mg by mouth 2 (two) times a week. Pt takes 1 tablet by mouth on Mon and Fri.   Yes Historical Provider, MD  escitalopram  (LEXAPRO) 20 MG tablet TAKE ONE TABLET BY MOUTH ONCE DAILY Patient taking differently: TAKE ONE TABLET (20 mg) BY MOUTH ONCE DAILY 08/30/15  Yes Marin Olp, MD  ibuprofen (ADVIL,MOTRIN) 200 MG tablet Take 200 mg by mouth See admin instructions. 200 mg every morning and evening and 200 mg as needed during the day   Yes Historical Provider, MD  LORazepam (ATIVAN) 0.5 MG tablet Take 1 tablet (0.5 mg total) by mouth 2 (two) times daily as needed for anxiety. Patient taking differently: Take 0.5 mg by mouth every morning.  08/11/15  Yes Marin Olp, MD  OLANZapine (ZYPREXA) 5 MG tablet Take 1 tablet (5 mg total) by mouth at bedtime as needed (severe agitation). Patient taking differently: Take 5 mg by mouth at bedtime.  09/13/15  Yes Marin Olp, MD  rosuvastatin (CRESTOR) 20 MG tablet Take 1 tablet (20 mg total) by mouth 3 (three) times a week. Pt takes 1 tablet by mouth on Mon, Wed and Fri. Patient taking differently: Take 10 mg by mouth 2 (two) times a week. Pt takes 1/2 tablet (10 mg) by mouth on Wed and Sat. 09/13/15  Yes Marin Olp, MD  vitamin E 400 UNIT capsule Take 400 Units by mouth 3 (three) times a week. Pt takes 1 capsule by mouth on Mon, Wed and Fri.   Yes Historical Provider, MD    Family History Family History  Problem Relation Age of Onset  . Heart disease Father   . Stroke Father   . AAA (abdominal aortic aneurysm) Sister   . AAA (abdominal aortic aneurysm) Brother   . Stroke Brother   . Coronary artery disease Brother   . Heart disease Brother   . Coronary artery disease      Social History Social History  Substance Use Topics  . Smoking status: Former Smoker    Packs/day: 1.00    Years: 30.00    Types: Cigarettes    Quit date: 01/17/1972  . Smokeless tobacco: Current User    Types: Chew  . Alcohol use No     Allergies   Avinza [morphine sulfate]; Doxycycline calcium; Vibramycin [doxycycline hyclate]; Flomax [tamsulosin hcl]; Statins; Codeine  sulfate; and Oxycodone   Review of Systems Review of Systems  Unable to perform ROS: Mental status change  Psychiatric/Behavioral: Positive for agitation and confusion.     Physical Exam Updated Vital Signs BP 146/85 (BP Location: Left Arm)   Pulse 92   Temp 99.7 F (37.6 C) (Oral)   Resp 18   SpO2 96%   Physical Exam  CONSTITUTIONAL: Disheveled, elderly HEAD: Normocephalic/atraumatic EYES: EOMI/PERRL ENMT: Mucous membranes moist NECK: supple no meningeal signs SPINE/BACK:entire spine nontender CV: S1/S2 noted, no murmurs/rubs/gallops noted LUNGS:  Lungs are clear to auscultation bilaterally, no apparent distress ABDOMEN: soft, nontender NEURO: Pt is awake/alert moves all extremitiesx4.  No facial droop.  He appears confused.  He will speak incoherently EXTREMITIES: pulses normal/equal, full ROM SKIN: warm, color normal PSYCH: unable to assess  ED Treatments / Results  Labs (all labs ordered are listed, but only abnormal results are displayed) Labs Reviewed  COMPREHENSIVE METABOLIC PANEL - Abnormal; Notable for the following:       Result Value   CO2 18 (*)    Glucose, Bld 109 (*)    Creatinine, Ser 1.27 (*)    Calcium 8.3 (*)    Total Protein 6.0 (*)    Albumin 3.4 (*)    Total Bilirubin 1.9 (*)    GFR calc non Af Amer 51 (*)    GFR calc Af Amer 59 (*)    All other components within normal limits  URINALYSIS, ROUTINE W REFLEX MICROSCOPIC (NOT AT [facility]) - Abnormal; Notable for the following:    Leukocytes, UA SMALL (*)    All other components within normal limits  URINE MICROSCOPIC-ADD ON - Abnormal; Notable for the following:    Squamous Epithelial / LPF 0-5 (*)    Bacteria, UA RARE (*)    All other components within normal limits  CBG MONITORING, ED - Abnormal; Notable for the following:    Glucose-Capillary 104 (*)    All other components within normal limits  CBC  I-STAT CG4 LACTIC ACID, ED  I-STAT CG4 LACTIC ACID, ED    EKG  EKG  Interpretation  Date/Time:  Thursday September 23 2015 19:50:18 EDT Ventricular Rate:  88 PR Interval:    QRS Duration: 92 QT Interval:  396 QTC Calculation: 480 R Axis:   5 Text Interpretation:  Sinus rhythm Atrial premature complexes Borderline T abnormalities, anterior leads Borderline prolonged QT interval Confirmed by Christy Gentles  MD, Antinette Keough ([id number]) on 09/23/2015 8:00:59 PM       Radiology Ct Head Wo Contrast  Result Date: 09/23/2015 CLINICAL DATA:  Confusion.  History of carotid stenosis. EXAM: CT HEAD WITHOUT CONTRAST TECHNIQUE: Contiguous axial images were obtained from the base of the skull through the vertex without intravenous contrast. COMPARISON:  Brain MRI 06/27/2013,Head CT 05/19/2010 FINDINGS: Brain: No mass lesion, intraparenchymal hemorrhage or extra-axial collection. No evidence of acute cortical infarct. There is confluent periventricular hypoattenuation compatible with chronic microvascular disease. Prominence of the ventricles and sulci suggest age-related atrophy. Vascular: There is atherosclerotic calcification of the internal carotid artery is. No hyperdense vessel sign. Skull: Lytic lesion within the right frontal calvarium is unchanged in size, measuring 1.9 by 0.9 cm. Skull is otherwise unremarkable. Sinuses/Orbits: No sinus fluid levels or advanced mucosal thickening. No mastoid effusion. Normal orbits. IMPRESSION: 1. No acute intracranial abnormality. 2. Findings of chronic microvascular disease and moderate atrophy. 3. Unchanged right frontal calvarial lesion. Electronically Signed   By: Ulyses Jarred M.D.   On: 09/23/2015 22:21    Procedures Procedures (including critical care time)  Medications Ordered in ED Medications  LORazepam (ATIVAN) injection 0.5 mg (0 mg Intravenous Hold 09/23/15 2211)  sodium chloride 0.9 % bolus 1,000 mL (0 mLs Intravenous Stopped 09/23/15 2340)     Initial Impression / Assessment and Plan / ED Course  I have reviewed the triage vital  signs and the nursing notes.  Pertinent labs results that were available during my care of the patient were reviewed by me and considered in my medical decision making (see chart for details).  Clinical Course    Pt with h/o dementia here with abrupt change in mental status with bowel incontinence/confusion Initial labs unrevealing CT head ordered 11:49 PM Initial workup reveals only dehydration Pt still confused, not at baseline Daughters report he did have new BP meds added but no other new meds Due to acute delirium or unclear etiology, I feel admission warranted as this is not c/w previous h/o dementia D/w dr Blaine Hamper for admission  Final Clinical Impressions(s) / ED Diagnoses   Final diagnoses:  Delirium    New Prescriptions New Prescriptions   No medications on file     Ripley Fraise, MD 09/23/15 2352

## 2015-09-24 ENCOUNTER — Observation Stay (HOSPITAL_COMMUNITY): Payer: Medicare Other

## 2015-09-24 DIAGNOSIS — H5440 Blindness, one eye, unspecified eye: Secondary | ICD-10-CM | POA: Diagnosis present

## 2015-09-24 DIAGNOSIS — F331 Major depressive disorder, recurrent, moderate: Secondary | ICD-10-CM | POA: Diagnosis not present

## 2015-09-24 DIAGNOSIS — I10 Essential (primary) hypertension: Secondary | ICD-10-CM | POA: Diagnosis not present

## 2015-09-24 DIAGNOSIS — Z85818 Personal history of malignant neoplasm of other sites of lip, oral cavity, and pharynx: Secondary | ICD-10-CM | POA: Diagnosis not present

## 2015-09-24 DIAGNOSIS — F329 Major depressive disorder, single episode, unspecified: Secondary | ICD-10-CM | POA: Diagnosis not present

## 2015-09-24 DIAGNOSIS — Z888 Allergy status to other drugs, medicaments and biological substances status: Secondary | ICD-10-CM | POA: Diagnosis not present

## 2015-09-24 DIAGNOSIS — E441 Mild protein-calorie malnutrition: Secondary | ICD-10-CM | POA: Diagnosis not present

## 2015-09-24 DIAGNOSIS — F0390 Unspecified dementia without behavioral disturbance: Secondary | ICD-10-CM | POA: Diagnosis present

## 2015-09-24 DIAGNOSIS — E860 Dehydration: Secondary | ICD-10-CM | POA: Diagnosis present

## 2015-09-24 DIAGNOSIS — I739 Peripheral vascular disease, unspecified: Secondary | ICD-10-CM

## 2015-09-24 DIAGNOSIS — K219 Gastro-esophageal reflux disease without esophagitis: Secondary | ICD-10-CM | POA: Diagnosis present

## 2015-09-24 DIAGNOSIS — I129 Hypertensive chronic kidney disease with stage 1 through stage 4 chronic kidney disease, or unspecified chronic kidney disease: Secondary | ICD-10-CM | POA: Diagnosis present

## 2015-09-24 DIAGNOSIS — E876 Hypokalemia: Secondary | ICD-10-CM | POA: Diagnosis present

## 2015-09-24 DIAGNOSIS — N400 Enlarged prostate without lower urinary tract symptoms: Secondary | ICD-10-CM | POA: Diagnosis present

## 2015-09-24 DIAGNOSIS — K5900 Constipation, unspecified: Secondary | ICD-10-CM | POA: Diagnosis not present

## 2015-09-24 DIAGNOSIS — E785 Hyperlipidemia, unspecified: Secondary | ICD-10-CM | POA: Diagnosis not present

## 2015-09-24 DIAGNOSIS — R451 Restlessness and agitation: Secondary | ICD-10-CM | POA: Diagnosis present

## 2015-09-24 DIAGNOSIS — F419 Anxiety disorder, unspecified: Secondary | ICD-10-CM | POA: Diagnosis present

## 2015-09-24 DIAGNOSIS — R41841 Cognitive communication deficit: Secondary | ICD-10-CM | POA: Diagnosis not present

## 2015-09-24 DIAGNOSIS — G9340 Encephalopathy, unspecified: Secondary | ICD-10-CM | POA: Diagnosis not present

## 2015-09-24 DIAGNOSIS — R2689 Other abnormalities of gait and mobility: Secondary | ICD-10-CM | POA: Diagnosis not present

## 2015-09-24 DIAGNOSIS — N200 Calculus of kidney: Secondary | ICD-10-CM | POA: Diagnosis not present

## 2015-09-24 DIAGNOSIS — Z951 Presence of aortocoronary bypass graft: Secondary | ICD-10-CM | POA: Diagnosis not present

## 2015-09-24 DIAGNOSIS — R410 Disorientation, unspecified: Secondary | ICD-10-CM | POA: Diagnosis not present

## 2015-09-24 DIAGNOSIS — I25810 Atherosclerosis of coronary artery bypass graft(s) without angina pectoris: Secondary | ICD-10-CM | POA: Diagnosis not present

## 2015-09-24 DIAGNOSIS — Z7982 Long term (current) use of aspirin: Secondary | ICD-10-CM | POA: Diagnosis not present

## 2015-09-24 DIAGNOSIS — Z885 Allergy status to narcotic agent status: Secondary | ICD-10-CM | POA: Diagnosis not present

## 2015-09-24 DIAGNOSIS — R278 Other lack of coordination: Secondary | ICD-10-CM | POA: Diagnosis not present

## 2015-09-24 DIAGNOSIS — N183 Chronic kidney disease, stage 3 (moderate): Secondary | ICD-10-CM | POA: Diagnosis not present

## 2015-09-24 DIAGNOSIS — R4182 Altered mental status, unspecified: Secondary | ICD-10-CM | POA: Diagnosis not present

## 2015-09-24 DIAGNOSIS — Z85819 Personal history of malignant neoplasm of unspecified site of lip, oral cavity, and pharynx: Secondary | ICD-10-CM | POA: Diagnosis not present

## 2015-09-24 DIAGNOSIS — Z87891 Personal history of nicotine dependence: Secondary | ICD-10-CM | POA: Diagnosis not present

## 2015-09-24 DIAGNOSIS — E784 Other hyperlipidemia: Secondary | ICD-10-CM | POA: Diagnosis not present

## 2015-09-24 DIAGNOSIS — J9811 Atelectasis: Secondary | ICD-10-CM | POA: Diagnosis not present

## 2015-09-24 DIAGNOSIS — Z87442 Personal history of urinary calculi: Secondary | ICD-10-CM | POA: Diagnosis not present

## 2015-09-24 DIAGNOSIS — F05 Delirium due to known physiological condition: Secondary | ICD-10-CM | POA: Diagnosis present

## 2015-09-24 DIAGNOSIS — I2510 Atherosclerotic heart disease of native coronary artery without angina pectoris: Secondary | ICD-10-CM | POA: Diagnosis present

## 2015-09-24 DIAGNOSIS — F0151 Vascular dementia with behavioral disturbance: Secondary | ICD-10-CM | POA: Diagnosis not present

## 2015-09-24 DIAGNOSIS — Z881 Allergy status to other antibiotic agents status: Secondary | ICD-10-CM | POA: Diagnosis not present

## 2015-09-24 LAB — BASIC METABOLIC PANEL
Anion gap: 8 (ref 5–15)
BUN: 16 mg/dL (ref 6–20)
CALCIUM: 8 mg/dL — AB (ref 8.9–10.3)
CO2: 21 mmol/L — AB (ref 22–32)
CREATININE: 1.14 mg/dL (ref 0.61–1.24)
Chloride: 112 mmol/L — ABNORMAL HIGH (ref 101–111)
GFR calc Af Amer: >60 mL/min (ref >60)
GFR, EST NON AFRICAN AMERICAN: 58 mL/min — AB (ref >60)
GLUCOSE: 80 mg/dL (ref 65–99)
Potassium: 3.4 mmol/L — ABNORMAL LOW (ref 3.5–5.1)
Sodium: 141 mmol/L (ref 135–145)

## 2015-09-24 LAB — CBC
HCT: 39.2 % (ref 39.0–52.0)
Hemoglobin: 12.7 g/dL — ABNORMAL LOW (ref 13.0–17.0)
MCH: 31.2 pg (ref 26.0–34.0)
MCHC: 32.4 g/dL (ref 30.0–36.0)
MCV: 96.3 fL (ref 78.0–100.0)
Platelets: 164 10*3/uL (ref 150–400)
RBC: 4.07 MIL/uL — ABNORMAL LOW (ref 4.22–5.81)
RDW: 14.7 % (ref 11.5–15.5)
WBC: 6.3 10*3/uL (ref 4.0–10.5)

## 2015-09-24 LAB — TROPONIN I

## 2015-09-24 LAB — TSH: TSH: 3.586 u[IU]/mL (ref 0.350–4.500)

## 2015-09-24 LAB — GLUCOSE, CAPILLARY: GLUCOSE-CAPILLARY: 78 mg/dL (ref 65–99)

## 2015-09-24 MED ORDER — AMLODIPINE BESYLATE 5 MG PO TABS
5.0000 mg | ORAL_TABLET | Freq: Every day | ORAL | Status: DC
  Administered 2015-09-24 – 2015-09-28 (×5): 5 mg via ORAL
  Filled 2015-09-24 (×5): qty 1

## 2015-09-24 MED ORDER — ACETAMINOPHEN 650 MG RE SUPP: 650.0000 mg | Freq: Four times a day (QID) | RECTAL | Status: DC | PRN

## 2015-09-24 MED ORDER — ENOXAPARIN SODIUM 40 MG/0.4ML ~~LOC~~ SOLN
40.0000 mg | SUBCUTANEOUS | Status: DC
  Administered 2015-09-24 – 2015-09-29 (×6): 40 mg via SUBCUTANEOUS
  Filled 2015-09-24 (×6): qty 0.4

## 2015-09-24 MED ORDER — LORAZEPAM 0.5 MG PO TABS
0.5000 mg | ORAL_TABLET | ORAL | Status: DC
  Administered 2015-09-25 – 2015-09-29 (×5): 0.5 mg via ORAL
  Filled 2015-09-24 (×5): qty 1

## 2015-09-24 MED ORDER — ONDANSETRON HCL 4 MG/2ML IJ SOLN: 4.0000 mg | Freq: Four times a day (QID) | INTRAMUSCULAR | Status: DC | PRN

## 2015-09-24 MED ORDER — BRIMONIDINE TARTRATE 0.2 % OP SOLN
1.0000 [drp] | Freq: Every day | OPHTHALMIC | Status: DC
  Administered 2015-09-24 – 2015-09-28 (×6): 1 [drp] via OPHTHALMIC
  Filled 2015-09-24: qty 5

## 2015-09-24 MED ORDER — DEXTROSE 5 % IV SOLN
1.0000 g | INTRAVENOUS | Status: AC
  Administered 2015-09-24 – 2015-09-26 (×3): 1 g via INTRAVENOUS
  Filled 2015-09-24 (×3): qty 10

## 2015-09-24 MED ORDER — ROSUVASTATIN CALCIUM 10 MG PO TABS
10.0000 mg | ORAL_TABLET | ORAL | Status: DC
  Administered 2015-09-25: 10 mg via ORAL
  Filled 2015-09-24: qty 1

## 2015-09-24 MED ORDER — HALOPERIDOL LACTATE 5 MG/ML IJ SOLN
1.0000 mg | Freq: Once | INTRAMUSCULAR | Status: AC
Start: 2015-09-24 — End: 2015-09-24
  Administered 2015-09-24: 1 mg via INTRAVENOUS
  Filled 2015-09-24: qty 1

## 2015-09-24 MED ORDER — ESCITALOPRAM OXALATE 20 MG PO TABS
20.0000 mg | ORAL_TABLET | Freq: Every day | ORAL | Status: DC
  Administered 2015-09-24 – 2015-09-29 (×6): 20 mg via ORAL
  Filled 2015-09-24 (×6): qty 1

## 2015-09-24 MED ORDER — SODIUM CHLORIDE 0.9% FLUSH
3.0000 mL | Freq: Two times a day (BID) | INTRAVENOUS | Status: DC
  Administered 2015-09-24 – 2015-09-29 (×8): 3 mL via INTRAVENOUS

## 2015-09-24 MED ORDER — ACETAMINOPHEN 325 MG PO TABS
650.0000 mg | ORAL_TABLET | Freq: Four times a day (QID) | ORAL | Status: DC | PRN
  Administered 2015-09-26: 650 mg via ORAL
  Filled 2015-09-24: qty 2

## 2015-09-24 MED ORDER — HYDRALAZINE HCL 20 MG/ML IJ SOLN: 5.0000 mg | INTRAMUSCULAR | Status: DC | PRN

## 2015-09-24 MED ORDER — SODIUM CHLORIDE 0.9 % IV SOLN
INTRAVENOUS | Status: DC
  Administered 2015-09-24 – 2015-09-26 (×3): via INTRAVENOUS
  Administered 2015-09-27: 1000 mL via INTRAVENOUS
  Administered 2015-09-28 – 2015-09-29 (×2): via INTRAVENOUS

## 2015-09-24 MED ORDER — DEXTROSE 5 % IV SOLN
1.0000 g | Freq: Once | INTRAVENOUS | Status: AC
  Administered 2015-09-24: 1 g via INTRAVENOUS
  Filled 2015-09-24: qty 10

## 2015-09-24 MED ORDER — LORAZEPAM 2 MG/ML IJ SOLN
0.5000 mg | Freq: Four times a day (QID) | INTRAMUSCULAR | Status: DC | PRN
  Administered 2015-09-24 – 2015-09-26 (×5): 0.5 mg via INTRAVENOUS
  Filled 2015-09-24 (×6): qty 1

## 2015-09-24 MED ORDER — ASPIRIN 81 MG PO CHEW
81.0000 mg | CHEWABLE_TABLET | ORAL | Status: DC
  Administered 2015-09-24 – 2015-09-27 (×2): 81 mg via ORAL
  Filled 2015-09-24 (×2): qty 1

## 2015-09-24 MED ORDER — HALOPERIDOL LACTATE 5 MG/ML IJ SOLN
1.0000 mg | Freq: Four times a day (QID) | INTRAMUSCULAR | Status: DC | PRN
  Administered 2015-09-25 – 2015-09-26 (×2): 1 mg via INTRAVENOUS
  Filled 2015-09-24 (×2): qty 1

## 2015-09-24 MED ORDER — ONDANSETRON HCL 4 MG PO TABS: 4.0000 mg | ORAL_TABLET | Freq: Four times a day (QID) | ORAL | Status: DC | PRN

## 2015-09-24 MED ORDER — OLANZAPINE 5 MG PO TABS
5.0000 mg | ORAL_TABLET | Freq: Every day | ORAL | Status: DC
Start: 2015-09-24 — End: 2015-09-25
  Administered 2015-09-24: 5 mg via ORAL
  Filled 2015-09-24 (×2): qty 1

## 2015-09-24 MED ORDER — VITAMIN E 180 MG (400 UNIT) PO CAPS
400.0000 [IU] | ORAL_CAPSULE | ORAL | Status: DC
  Administered 2015-09-24 – 2015-09-27 (×2): 400 [IU] via ORAL
  Filled 2015-09-24 (×2): qty 1

## 2015-09-24 NOTE — Care Management Note (Signed)
Case Management Note  Patient Details  Name: [name] MRN: [id number] Date of Birth: [birth year]  Subjective/Objective:                 Patient from home, alone. Patient's daughter lives in house about 30 yards away. She cooks and cares for patient. Patient does not use DME. Has cane available.  PCP Dr Yong Channel, [place] [facility]   Action/Plan:  Family would like HH PT and HHA   Expected Discharge Date:                  Expected Discharge Plan:     In-House Referral:  Clinical Social Work  Discharge planning Services  CM Consult  Post Acute Care Choice:    Choice offered to:     DME Arranged:    DME Agency:     HH Arranged:    May Agency:     Status of Service:  In process, will continue to follow  If discussed at Long Length of Stay Meetings, dates discussed:    Additional Comments:  Carles Collet, RN 09/24/2015, 2:14 PM

## 2015-09-24 NOTE — Progress Notes (Signed)
PT Cancellation Note  Patient Details Name: KYMIER CLEVERLY MRN: [id number] DOB: 12-13-1931   Cancelled Treatment:    Reason Eval/Treat Not Completed: Patient at procedure or test/unavailable; patient currently in MRI.  Attempted earlier and pt/family with MD.  Will attempt to see tomorrow.   Reginia Naas 09/24/2015, 2:40 PM  Magda Kiel, [place] 09/24/2015

## 2015-09-24 NOTE — Care Management Obs Status (Signed)
[facility] NOTIFICATION   Patient Details  Name: [name] MRN: [id number] Date of Birth: 26-May-1931   Medicare Observation Status Notification Given:  Yes    Carles Collet, RN 09/24/2015, 12:11 PM

## 2015-09-24 NOTE — Progress Notes (Signed)
NURSING PROGRESS NOTE  STERLYN ROMO [id number] Admission Data: 09/24/2015 1:46 AM Attending Provider: Ivor Costa, MD [id number] Yong Channel, MD Code Status: Full  Allergies:  Avinza [morphine sulfate]; Doxycycline calcium; Vibramycin [doxycycline hyclate]; Flomax [tamsulosin hcl]; Statins; Codeine sulfate; and Oxycodone Past Medical History:   has a past medical history of Abdominal aortic aneurysm ([place]); Aphthous ulcer; BPH (benign prostatic hypertrophy); CAD (coronary artery disease); Carotid stenosis; Cervical spondylosis without myelopathy; CKD (chronic kidney disease), stage III; GERD (gastroesophageal reflux disease); History of nephrolithiasis; HOH (hard of hearing); Hyperlipidemia; Memory deficits (06/19/2013); Pre-syncope; Radicular low back pain; Throat cancer ([place]); Unilateral blindness; and Unspecified adverse effect of unspecified drug, medicinal and biological substance. Past Surgical History:   has a past surgical history that includes Coronary artery bypass graft; Coronary artery bypass graft (1998); Cholecystectomy; ostate surgery; Back surgery; renal stone surgery; Throat surgery; Cardiac catheterization; Carotid endarterectomy (Left); Hernia repair; Inguinal hernia repair (Right, 09/29/2013); and Insertion of mesh (Right, 09/29/2013). Social History:   reports that he quit smoking about 43 years ago. His smoking use included Cigarettes. He has a 30.00 pack-year smoking history. His smokeless tobacco use includes Chew. He reports that he does not drink alcohol or use drugs.  ELLERY WIDER is a 82 y.o. male patient admitted from ED:   Last Documented Vital Signs: Blood pressure (!) 146/79, pulse 68, temperature 98.2 F (36.8 C), temperature source Oral, resp. rate 18, height 6' (1.829 m), weight 86.4 kg (190 lb 7.6 oz), SpO2 94 %.  Cardiac Monitoring: Box # 14 in place. Cardiac monitor yields:normal sinus rhythm.  IV Fluids:  IV in place, occlusive dsg intact without redness, IV cath hand  left, condition patent and no redness normal saline.   Skin: Dry, flaky, intact  Patient/Family orientated to room. Information packet given to patient/family. Admission inpatient armband information verified with patient/family to include name and date of birth and placed on patient arm. Side rails up x 2, fall assessment and education completed with patient/family. Patient/family able to verbalize understanding of risk associated with falls and verbalized understanding to call for assistance before getting out of bed. Call light within reach. Patient/family able to voice and demonstrate understanding of unit orientation instructions.    Will continue to evaluate and treat per MD orders.   Amaryllis Dyke, RN

## 2015-09-24 NOTE — Progress Notes (Signed)
PROGRESS NOTE    [name]  [id number] DOB: January 26, 1931 DOA: 09/23/2015 PCP: Garret Reddish, MD   Brief Narrative: [name] is a 82 y.o. male with medical history significant of hypertension, hyperlipidemia, GERD, depression, anxiety, dementia, CAD, s/p of CABG 1998, remote throat cancer, BPH, AAA, CKD-III, who presents with altered mental status.   Per his daughter, pt has been confused in the past 2 days. He is also agitated. Has chronic neck pain, which is at his baseline. Per his daughter, patient moves all extremities normally. No vision change or hearing loss. Daughter states that patient has been more sedentary since 06/03/22 when his wife died. Per his daughter, patient does not have nausea, vomiting, diarrhea, abdominal pain. He has urge to urinate per his daughter. Not sure if he has dysuria or burning on urination.   Assessment & Plan:   Principal Problem:   Acute encephalopathy Active Problems:   Hyperlipidemia   Essential hypertension   CAD (coronary artery disease) s/p bypass graft   PVD- hx CEA,patent carotids by doppler July 2015   CKD (chronic kidney disease), stage III   Depression   UTI (lower urinary tract infection)   Acute encephalopathy Unclear etiology. So far workup has been negative except for some leukocytosis on urinalysis. Patient has been started on ceftriaxone for possible urinary tract infection leading to encephalopathy. Patient has a significant history of coronary artery disease. In addition to vascular dementia, stroke is high on the differential. Patient does not have focal neurologic deficits. TSH normal. Creatinine improved -Continue ceftriaxone, awaiting cultures -MRI brain  Possible UTI Urinalysis only significant for leukocytes. That he has any active junction, however will treat the setting of acute encephalopathy. -Follow-up urine and blood culture  Hyperlipidemia -Continue Crestor  Hypertension -Continue amlodipine  Coronary  artery disease Patient status post bypass graft. Currently has no chest pain. Troponin negative -Continue aspirin and Crestor  Chronic kidney disease, stage III Currently back at baseline  Hypokalemia Slightly low - follow-up tomorrow  DVT prophylaxis: Lovenox subcutaneous  Code Status: Full code Family Communication: Daughter is at bedside  Disposition Plan: Anticipate discharge home tomorrow after workup.   Consultants:   None   Procedures:   None   Antimicrobials:  Ceftriaxone (9/8>>    Subjective: Patient reports no problems overnight. No chest pain or dyspnea.   Objective: Vitals:   09/24/15 0036 09/24/15 0102 09/24/15 0103 09/24/15 0634  BP: 124/80  (!) 146/79 132/66  Pulse: 71  68 60  Resp: 18  18 18   Temp:   98.2 F (36.8 C) 97.8 F (36.6 C)  TempSrc:   Oral   SpO2: 99%  94% 93%  Weight:  86.4 kg (190 lb 7.6 oz)    Height:  6' (1.829 m)      Intake/Output Summary (Last 24 hours) at 09/24/15 0728 Last data filed at 09/24/15 [id number]  Gross per 24 hour  Intake          1411.75 ml  Output              200 ml  Net          1211.75 ml   Filed Weights   09/24/15 0102  Weight: 86.4 kg (190 lb 7.6 oz)    Examination:  General exam: Appears calm and comfortable Respiratory system: Clear to auscultation. Respiratory effort normal. Cardiovascular system: S1 & S2 heard, RRR. No JVD, murmurs, rubs, gallops or clicks. Gastrointestinal system: Abdomen is nondistended, soft and nontender. No  organomegaly or masses felt. Normal bowel sounds heard. Central nervous system: Alert and oriented x1. No focal neurological deficits. Extremities: No cyanosis, no edema  Skin: No rashes, lesions or ulcers Psychiatry: Mood & affect appropriate.     Data Reviewed: I have personally reviewed following labs and imaging studies  CBC:  Recent Labs Lab 09/23/15 1950  WBC 8.5  HGB 13.8  HCT 42.3  MCV 96.6  PLT [id number]   Basic Metabolic Panel:  Recent Labs Lab  09/23/15 1950  NA 138  K 3.7  CL 110  CO2 18*  GLUCOSE 109*  BUN 16  CREATININE 1.27*  CALCIUM 8.3*   GFR: Estimated Creatinine Clearance: 49.2 mL/min (by C-G formula based on SCr of 1.27 mg/dL). Liver Function Tests:  Recent Labs Lab 09/23/15 1950  AST 24  ALT 18  ALKPHOS 64  BILITOT 1.9*  PROT 6.0*  ALBUMIN 3.4*   No results for input(s): LIPASE, AMYLASE in the last 168 hours. No results for input(s): AMMONIA in the last 168 hours. Coagulation Profile: No results for input(s): INR, PROTIME in the last 168 hours. Cardiac Enzymes: No results for input(s): CKTOTAL, CKMB, CKMBINDEX, TROPONINI in the last 168 hours. BNP (last 3 results) No results for input(s): PROBNP in the last 8760 hours. HbA1C: No results for input(s): HGBA1C in the last 72 hours. CBG:  Recent Labs Lab 09/23/15 1959  GLUCAP 104*   Lipid Profile: No results for input(s): CHOL, HDL, LDLCALC, TRIG, CHOLHDL, LDLDIRECT in the last 72 hours. Thyroid Function Tests: No results for input(s): TSH, T4TOTAL, FREET4, T3FREE, THYROIDAB in the last 72 hours. Anemia Panel: No results for input(s): VITAMINB12, FOLATE, FERRITIN, TIBC, IRON, RETICCTPCT in the last 72 hours. Sepsis Labs:  Recent Labs Lab 09/23/15 2001 09/23/15 2339  LATICACIDVEN 0.64 0.51    No results found for this or any previous visit (from the past 240 hour(s)).       Radiology Studies: Ct Head Wo Contrast  Result Date: 09/23/2015 CLINICAL DATA:  Confusion.  History of carotid stenosis. EXAM: CT HEAD WITHOUT CONTRAST TECHNIQUE: Contiguous axial images were obtained from the base of the skull through the vertex without intravenous contrast. COMPARISON:  Brain MRI 06/27/2013,Head CT 05/19/2010 FINDINGS: Brain: No mass lesion, intraparenchymal hemorrhage or extra-axial collection. No evidence of acute cortical infarct. There is confluent periventricular hypoattenuation compatible with chronic microvascular disease. Prominence of the  ventricles and sulci suggest age-related atrophy. Vascular: There is atherosclerotic calcification of the internal carotid artery is. No hyperdense vessel sign. Skull: Lytic lesion within the right frontal calvarium is unchanged in size, measuring 1.9 by 0.9 cm. Skull is otherwise unremarkable. Sinuses/Orbits: No sinus fluid levels or advanced mucosal thickening. No mastoid effusion. Normal orbits. IMPRESSION: 1. No acute intracranial abnormality. 2. Findings of chronic microvascular disease and moderate atrophy. 3. Unchanged right frontal calvarial lesion. Electronically Signed   By: Ulyses Jarred M.D.   On: 09/23/2015 22:21        Scheduled Meds: . amLODipine  5 mg Oral Daily  . aspirin  81 mg Oral Once per day on Mon Fri  . brimonidine  1 drop Both Eyes QHS  . cefTRIAXone (ROCEPHIN)  IV  1 g Intravenous Q24H  . enoxaparin (LOVENOX) injection  40 mg Subcutaneous Q24H  . escitalopram  20 mg Oral Daily  . OLANZapine  5 mg Oral QHS  . [START ON 09/25/2015] rosuvastatin  10 mg Oral Once per day on Wed Sat  . sodium chloride flush  3 mL Intravenous  Q12H  . vitamin E  400 Units Oral Once per day on Mon Wed Fri   Continuous Infusions: . sodium chloride 75 mL/hr at 09/24/15 0105     LOS: 0 days     Cordelia Poche, MD [facility] 09/24/2015, 7:28 AM Pager: [phone]  If 7PM-7AM, please contact night-coverage [email or web address] Password [facility] 09/24/2015, 7:28 AM

## 2015-09-24 NOTE — Progress Notes (Signed)
Attempted report 

## 2015-09-25 ENCOUNTER — Inpatient Hospital Stay (HOSPITAL_COMMUNITY): Payer: Medicare Other

## 2015-09-25 DIAGNOSIS — E785 Hyperlipidemia, unspecified: Secondary | ICD-10-CM

## 2015-09-25 LAB — BASIC METABOLIC PANEL
Anion gap: 11 (ref 5–15)
BUN: 14 mg/dL (ref 6–20)
CALCIUM: 8.2 mg/dL — AB (ref 8.9–10.3)
CO2: 19 mmol/L — ABNORMAL LOW (ref 22–32)
Chloride: 110 mmol/L (ref 101–111)
Creatinine, Ser: 1.03 mg/dL (ref 0.61–1.24)
GFR calc Af Amer: >60 mL/min (ref >60)
Glucose, Bld: 79 mg/dL (ref 65–99)
POTASSIUM: 3.6 mmol/L (ref 3.5–5.1)
SODIUM: 140 mmol/L (ref 135–145)

## 2015-09-25 LAB — URINE CULTURE: Culture: 1000 — AB

## 2015-09-25 LAB — GLUCOSE, CAPILLARY: GLUCOSE-CAPILLARY: 83 mg/dL (ref 65–99)

## 2015-09-25 MED ORDER — OLANZAPINE 10 MG PO TABS
10.0000 mg | ORAL_TABLET | Freq: Every day | ORAL | Status: DC
  Filled 2015-09-25 (×3): qty 1

## 2015-09-25 MED ORDER — OLANZAPINE 10 MG PO TABS
10.0000 mg | ORAL_TABLET | Freq: Every day | ORAL | Status: DC
  Administered 2015-09-25 – 2015-09-27 (×3): 10 mg via ORAL
  Filled 2015-09-25 (×3): qty 1

## 2015-09-25 MED ORDER — OLANZAPINE 10 MG PO TABS
10.0000 mg | ORAL_TABLET | Freq: Every day | ORAL | Status: DC | PRN
  Filled 2015-09-25 (×2): qty 1

## 2015-09-25 NOTE — Progress Notes (Signed)
PROGRESS NOTE    [name]  [id number] DOB: May 31, 1931 DOA: 09/23/2015 PCP: Garret Reddish, MD   Brief Narrative: [name] is a 82 y.o. male with medical history significant of hypertension, hyperlipidemia, GERD, depression, anxiety, dementia, CAD, s/p of CABG 1998, remote throat cancer, BPH, AAA, CKD-III, who presents with altered mental status.   Per his daughter, pt has been confused in the past 2 days. He is also agitated. Has chronic neck pain, which is at his baseline. Per his daughter, patient moves all extremities normally. No vision change or hearing loss. Daughter states that patient has been more sedentary since 11-Jun-2022 when his wife died. Per his daughter, patient does not have nausea, vomiting, diarrhea, abdominal pain. He has urge to urinate per his daughter. Not sure if he has dysuria or burning on urination.   Assessment & Plan:   Principal Problem:   Acute encephalopathy Active Problems:   Hyperlipidemia   Essential hypertension   CAD (coronary artery disease) s/p bypass graft   PVD- hx CEA,patent carotids by doppler July 2015   CKD (chronic kidney disease), stage III   Depression   UTI (lower urinary tract infection)   Acute encephalopathy Agitation Unclear etiology. So far workup has been negative except for some leukocytosis on urinalysis. Patient has been started on ceftriaxone for possible urinary tract infection leading to encephalopathy. Patient has a significant history of coronary artery disease. In addition to vascular dementia, stroke is high on the differential. Patient does not have focal neurologic deficits. TSH normal. Creatinine improved. MRI negative for acute stroke. Possible delirium. Urine culture with insignificant growth. -discontinue ceftriaxone -increase zyprexa to 10mg  qhs -may consider consulting palliative care to help with symptoms if patient is continuing to display agitation  Possible UTI Urinalysis only significant for  leukocytes. That he has any active junction, however will treat the setting of acute encephalopathy. Urine culture without significant growth -discontinue ceftriaxone  Hyperlipidemia -Continue Crestor  Hypertension -Continue amlodipine  Coronary artery disease Patient status post bypass graft. Currently has no chest pain. Troponin negative -Continue aspirin and Crestor  Chronic kidney disease, stage III Currently back at baseline  Hypokalemia Slightly low  DVT prophylaxis: Lovenox subcutaneous  Code Status: Full code Family Communication: No family at bedside Disposition Plan: Anticipate discharge after mental status improves.   Consultants:   None   Procedures:   None   Antimicrobials:  Ceftriaxone (9/8>> 9/9)   Subjective: Patient reports no problems overnight. Eating breakfast. He states he is not in any pain.  Objective: Vitals:   09/24/15 0634 09/24/15 2247 09/25/15 0538 09/25/15 1319  BP: 132/66 (!) 151/96 (!) 155/99 (!) 172/87  Pulse: 60 87 80 74  Resp: 18 17 18 20   Temp: 97.8 F (36.6 C) 99.5 F (37.5 C) 98.4 F (36.9 C) 98 F (36.7 C)  TempSrc:  Oral Oral Oral  SpO2: 93% 95% 95% 93%  Weight:      Height:        Intake/Output Summary (Last 24 hours) at 09/25/15 1441 Last data filed at 09/25/15 0900  Gross per 24 hour  Intake              120 ml  Output                0 ml  Net              120 ml   Filed Weights   09/24/15 0102  Weight: 86.4 kg (190 lb 7.6  oz)    Examination:  General exam: Appears calm and comfortable. Restraint mittens on hands Respiratory system: Clear to auscultation. Respiratory effort normal. Cardiovascular system: S1 & S2 heard, RRR. No murmurs, rubs, gallops or clicks. Gastrointestinal system: Abdomen is nondistended, soft and nontender. No organomegaly or masses felt. Normal bowel sounds heard. Central nervous system: Alert and oriented x1. No focal neurological deficits. Extremities: No cyanosis, no edema    Skin: No rashes, lesions or ulcers Psychiatry: Mood & affect slightly flat    Data Reviewed: I have personally reviewed following labs and imaging studies  CBC:  Recent Labs Lab 09/23/15 1950 09/24/15 0629  WBC 8.5 6.3  HGB 13.8 12.7*  HCT 42.3 39.2  MCV 96.6 96.3  PLT 173 123456   Basic Metabolic Panel:  Recent Labs Lab 09/23/15 1950 09/24/15 0629 09/25/15 0721  NA 138 141 140  K 3.7 3.4* 3.6  CL 110 112* 110  CO2 18* 21* 19*  GLUCOSE 109* 80 79  BUN 16 16 14   CREATININE 1.27* 1.14 1.03  CALCIUM 8.3* 8.0* 8.2*   GFR: Estimated Creatinine Clearance: 60.7 mL/min (by C-G formula based on SCr of 1.03 mg/dL). Liver Function Tests:  Recent Labs Lab 09/23/15 1950  AST 24  ALT 18  ALKPHOS 64  BILITOT 1.9*  PROT 6.0*  ALBUMIN 3.4*   No results for input(s): LIPASE, AMYLASE in the last 168 hours. No results for input(s): AMMONIA in the last 168 hours. Coagulation Profile: No results for input(s): INR, PROTIME in the last 168 hours. Cardiac Enzymes:  Recent Labs Lab 09/24/15 0830  TROPONINI <0.03   BNP (last 3 results) No results for input(s): PROBNP in the last 8760 hours. HbA1C: No results for input(s): HGBA1C in the last 72 hours. CBG:  Recent Labs Lab 09/23/15 1959 09/24/15 0804 09/25/15 0755  GLUCAP 104* 78 83   Lipid Profile: No results for input(s): CHOL, HDL, LDLCALC, TRIG, CHOLHDL, LDLDIRECT in the last 72 hours. Thyroid Function Tests:  Recent Labs  09/24/15 0830  TSH 3.586   Anemia Panel: No results for input(s): VITAMINB12, FOLATE, FERRITIN, TIBC, IRON, RETICCTPCT in the last 72 hours. Sepsis Labs:  Recent Labs Lab 09/23/15 2001 09/23/15 2339  LATICACIDVEN 0.64 0.51    Recent Results (from the past 240 hour(s))  Urine culture     Status: Abnormal   Collection Time: 09/23/15  8:59 PM  Result Value Ref Range Status   Specimen Description URINE, RANDOM  Final   Special Requests NONE  Final   Culture 1,000 COLONIES/mL  INSIGNIFICANT GROWTH (A)  Final   Report Status 09/25/2015 FINAL  Final         Radiology Studies: Ct Head Wo Contrast  Result Date: 09/23/2015 CLINICAL DATA:  Confusion.  History of carotid stenosis. EXAM: CT HEAD WITHOUT CONTRAST TECHNIQUE: Contiguous axial images were obtained from the base of the skull through the vertex without intravenous contrast. COMPARISON:  Brain MRI 06/27/2013,Head CT 05/19/2010 FINDINGS: Brain: No mass lesion, intraparenchymal hemorrhage or extra-axial collection. No evidence of acute cortical infarct. There is confluent periventricular hypoattenuation compatible with chronic microvascular disease. Prominence of the ventricles and sulci suggest age-related atrophy. Vascular: There is atherosclerotic calcification of the internal carotid artery is. No hyperdense vessel sign. Skull: Lytic lesion within the right frontal calvarium is unchanged in size, measuring 1.9 by 0.9 cm. Skull is otherwise unremarkable. Sinuses/Orbits: No sinus fluid levels or advanced mucosal thickening. No mastoid effusion. Normal orbits. IMPRESSION: 1. No acute intracranial abnormality. 2. Findings of  chronic microvascular disease and moderate atrophy. 3. Unchanged right frontal calvarial lesion. Electronically Signed   By: Ulyses Jarred M.D.   On: 09/23/2015 22:21   Mr Brain Wo Contrast  Result Date: 09/24/2015 CLINICAL DATA:  Altered mental status. EXAM: MRI HEAD WITHOUT CONTRAST TECHNIQUE: Multiplanar, multiecho pulse sequences of the brain and surrounding structures were obtained without intravenous contrast. COMPARISON:  Head CT 09/23/2015 and MRI 06/27/2013 FINDINGS: Brain: There is no evidence of acute infarct, intracranial hemorrhage, mass, midline shift, or extra-axial fluid collection. There is moderate enlargement of the lateral and third ventricles which has mildly progressed from the prior MRI. There is mild diffuse enlargement of the cerebral sulci and sylvian fissures. Periventricular  white matter T2 hyperintensities are mildly advanced for age and have slightly progressed from 2015. Mild patchy T2 hyperintensities are also present in the pons. Vascular: Unchanged, hypoplastic appearance of the distal left vertebral artery with either slow flow or occlusion. Other major intracranial vascular flow voids are preserved, including of the dominant right vertebral artery. Skull and upper cervical spine: Unchanged 2.4 cm T2 hyperintense right frontal skull lesion. Prior anterior cervical fusion. Sinuses/Orbits: Unremarkable. Other: None. IMPRESSION: 1. No acute intracranial abnormality. 2. Moderate ventriculomegaly, mildly progressed from 2015 and favored to reflect cerebral atrophy over hydrocephalus. 3. Mild cerebral white matter disease, slightly progressed from 2015 and nonspecific but compatible with mild chronic small vessel ischemic disease. Electronically Signed   By: Logan Bores M.D.   On: 09/24/2015 15:11   Dg Chest Port 1 View  Result Date: 09/24/2015 CLINICAL DATA:  Confusion EXAM: PORTABLE CHEST 1 VIEW COMPARISON:  10/01/2013 FINDINGS: Prior CABG. Cardiomegaly. Low lung volumes with bibasilar atelectasis. No effusions or acute bony abnormality. IMPRESSION: Cardiomegaly.  Bibasilar atelectasis. Electronically Signed   By: Rolm Baptise M.D.   On: 09/24/2015 08:58        Scheduled Meds: . amLODipine  5 mg Oral Daily  . aspirin  81 mg Oral Once per day on Mon Fri  . brimonidine  1 drop Both Eyes QHS  . cefTRIAXone (ROCEPHIN)  IV  1 g Intravenous Q24H  . enoxaparin (LOVENOX) injection  40 mg Subcutaneous Q24H  . escitalopram  20 mg Oral Daily  . LORazepam  0.5 mg Oral BH-q7a  . OLANZapine  10 mg Oral QHS  . rosuvastatin  10 mg Oral Once per day on Wed Sat  . sodium chloride flush  3 mL Intravenous Q12H  . vitamin E  400 Units Oral Once per day on Mon Wed Fri   Continuous Infusions: . sodium chloride Stopped (09/24/15 1407)     LOS: 1 day     Cordelia Poche,  MD [facility] 09/25/2015, 2:41 PM Pager: [phone]  If 7PM-7AM, please contact night-coverage [email or web address] Password [email or web address] 09/25/2015, 2:41 PM

## 2015-09-25 NOTE — Progress Notes (Signed)
PT Cancellation Note  Patient Details Name: [name] MRN: [id number] DOB: 07/08/1931   Cancelled Treatment:    Reason Eval/Treat Not Completed: Other (comment) (Patient with active bed rest order, nursing seeking clarification, will re-attempt as able.)   Zenia Resides, Lashawnda Hancox L 09/25/2015, 4:36 PM

## 2015-09-26 LAB — GLUCOSE, CAPILLARY: Glucose-Capillary: 99 mg/dL (ref 65–99)

## 2015-09-26 MED ORDER — ALBUTEROL SULFATE (2.5 MG/3ML) 0.083% IN NEBU
5.0000 mg | INHALATION_SOLUTION | Freq: Once | RESPIRATORY_TRACT | Status: AC
  Administered 2015-09-26: 5 mg via RESPIRATORY_TRACT
  Filled 2015-09-26: qty 6

## 2015-09-26 MED ORDER — ALBUTEROL SULFATE (2.5 MG/3ML) 0.083% IN NEBU
2.5000 mg | INHALATION_SOLUTION | Freq: Four times a day (QID) | RESPIRATORY_TRACT | Status: DC | PRN
  Administered 2015-09-26 – 2015-09-28 (×2): 2.5 mg via RESPIRATORY_TRACT
  Filled 2015-09-26: qty 3

## 2015-09-26 MED ORDER — POLYETHYLENE GLYCOL 3350 17 G PO PACK
17.0000 g | PACK | Freq: Two times a day (BID) | ORAL | Status: DC
  Administered 2015-09-26 – 2015-09-29 (×6): 17 g via ORAL
  Filled 2015-09-26 (×7): qty 1

## 2015-09-26 NOTE — Progress Notes (Addendum)
PROGRESS NOTE    RIC CRAFTS  [id number] DOB: Apr 27, 1931 DOA: 09/23/2015 PCP: Garret Reddish, MD   Brief Narrative: [name] is a 82 y.o. male with medical history significant of hypertension, hyperlipidemia, GERD, depression, anxiety, dementia, CAD, s/p of CABG 1998, remote throat cancer, BPH, AAA, CKD-III, who presents with altered mental status.   Per his daughter, pt has been confused in the past 2 days. He is also agitated. Has chronic neck pain, which is at his baseline. Per his daughter, patient moves all extremities normally. No vision change or hearing loss. Daughter states that patient has been more sedentary since Jun 05, 2022 when his wife died. Per his daughter, patient does not have nausea, vomiting, diarrhea, abdominal pain. He has urge to urinate per his daughter. Not sure if he has dysuria or burning on urination.   Assessment & Plan:   Principal Problem:   Acute encephalopathy Active Problems:   Hyperlipidemia   Essential hypertension   CAD (coronary artery disease) s/p bypass graft   PVD- hx CEA,patent carotids by doppler July 2015   CKD (chronic kidney disease), stage III   Depression   UTI (lower urinary tract infection)   Acute encephalopathy Agitation I really think this is secondary to decline in cognitive function secondary to dementia. Patient does have some some distention. KUB yesterday was not specific for etiology.  -will start MiraLAX twice a day -Continue zyprexa to 10mg  qhs, although patient was sleepy today. May need to decrease this and provide twice a day (he also got Haldol) -discontinue Haldol when necessary -Continue Ativan when necessary -will attempt to get him off of restraints  ?UTI Urinalysis only significant for leukocytes. Urine culture without significant growth.   -discontinue ceftriaxone today (9/10)  Hyperlipidemia -Continue Crestor  Hypertension -Continue amlodipine  Coronary artery disease Patient status post bypass  graft. Currently has no chest pain. Troponin negative -Continue aspirin and Crestor  Chronic kidney disease, stage III Currently back at baseline  Hypokalemia Slightly low  DVT prophylaxis: Lovenox subcutaneous  Code Status: Full code Family Communication: No family at bedside Disposition Plan: Anticipate discharge after mental status improves.   Consultants:   None   Procedures:   None   Antimicrobials:  Ceftriaxone (9/8>> 9/10)   Subjective: Patient reports no problems overnight. Upon talking with nurse, patient had an episode of confusion last night where he pulled out his IV. A new IV was placed and wrapped last night.  Objective: Vitals:   09/25/15 1319 09/25/15 2139 09/26/15 0341 09/26/15 0619  BP: (!) 172/87 (!) 145/97  (!) 141/95  Pulse: 74 68  77  Resp: 20 (!) 22  (!) 22  Temp: 98 F (36.7 C) 97.9 F (36.6 C)  98.8 F (37.1 C)  TempSrc: Oral   Oral  SpO2: 93% 92% 93% 93%  Weight:      Height:        Intake/Output Summary (Last 24 hours) at 09/26/15 1245 Last data filed at 09/26/15 0900  Gross per 24 hour  Intake          1681.25 ml  Output             1500 ml  Net           181.25 ml   Filed Weights   09/24/15 0102  Weight: 86.4 kg (190 lb 7.6 oz)    Examination:  General exam: Appears calm and comfortable. Restraint mittens on hands. Somnolent. Respiratory system: Clear to auscultation. Respiratory effort normal.  Cardiovascular system: S1 & S2 heard, RRR. No murmurs, rubs, gallops or clicks. Gastrointestinal system: Abdomen is nondistended, soft and nontender. No organomegaly or masses felt. Normal bowel sounds heard. Central nervous system: Alert and oriented x1. No focal neurological deficits. Difficult to arouse. Extremities: No cyanosis, no edema  Skin: No rashes, lesions or ulcers Psychiatry: Mood & affect slightly flat    Data Reviewed: I have personally reviewed following labs and imaging studies  CBC:  Recent Labs Lab  09/23/15 1950 09/24/15 0629  WBC 8.5 6.3  HGB 13.8 12.7*  HCT 42.3 39.2  MCV 96.6 96.3  PLT 173 123456   Basic Metabolic Panel:  Recent Labs Lab 09/23/15 1950 09/24/15 0629 09/25/15 0721  NA 138 141 140  K 3.7 3.4* 3.6  CL 110 112* 110  CO2 18* 21* 19*  GLUCOSE 109* 80 79  BUN 16 16 14   CREATININE 1.27* 1.14 1.03  CALCIUM 8.3* 8.0* 8.2*   GFR: Estimated Creatinine Clearance: 60.7 mL/min (by C-G formula based on SCr of 1.03 mg/dL). Liver Function Tests:  Recent Labs Lab 09/23/15 1950  AST 24  ALT 18  ALKPHOS 64  BILITOT 1.9*  PROT 6.0*  ALBUMIN 3.4*   No results for input(s): LIPASE, AMYLASE in the last 168 hours. No results for input(s): AMMONIA in the last 168 hours. Coagulation Profile: No results for input(s): INR, PROTIME in the last 168 hours. Cardiac Enzymes:  Recent Labs Lab 09/24/15 0830  TROPONINI <0.03   BNP (last 3 results) No results for input(s): PROBNP in the last 8760 hours. HbA1C: No results for input(s): HGBA1C in the last 72 hours. CBG:  Recent Labs Lab 09/23/15 1959 09/24/15 0804 09/25/15 0755 09/26/15 0808  GLUCAP 104* 78 83 99   Lipid Profile: No results for input(s): CHOL, HDL, LDLCALC, TRIG, CHOLHDL, LDLDIRECT in the last 72 hours. Thyroid Function Tests:  Recent Labs  09/24/15 0830  TSH 3.586   Anemia Panel: No results for input(s): VITAMINB12, FOLATE, FERRITIN, TIBC, IRON, RETICCTPCT in the last 72 hours. Sepsis Labs:  Recent Labs Lab 09/23/15 2001 09/23/15 2339  LATICACIDVEN 0.64 0.51    Recent Results (from the past 240 hour(s))  Urine culture     Status: Abnormal   Collection Time: 09/23/15  8:59 PM  Result Value Ref Range Status   Specimen Description URINE, RANDOM  Final   Special Requests NONE  Final   Culture 1,000 COLONIES/mL INSIGNIFICANT GROWTH (A)  Final   Report Status 09/25/2015 FINAL  Final         Radiology Studies: Mr Brain Wo Contrast  Result Date: 09/24/2015 CLINICAL DATA:   Altered mental status. EXAM: MRI HEAD WITHOUT CONTRAST TECHNIQUE: Multiplanar, multiecho pulse sequences of the brain and surrounding structures were obtained without intravenous contrast. COMPARISON:  Head CT 09/23/2015 and MRI 06/27/2013 FINDINGS: Brain: There is no evidence of acute infarct, intracranial hemorrhage, mass, midline shift, or extra-axial fluid collection. There is moderate enlargement of the lateral and third ventricles which has mildly progressed from the prior MRI. There is mild diffuse enlargement of the cerebral sulci and sylvian fissures. Periventricular white matter T2 hyperintensities are mildly advanced for age and have slightly progressed from 2015. Mild patchy T2 hyperintensities are also present in the pons. Vascular: Unchanged, hypoplastic appearance of the distal left vertebral artery with either slow flow or occlusion. Other major intracranial vascular flow voids are preserved, including of the dominant right vertebral artery. Skull and upper cervical spine: Unchanged 2.4 cm T2 hyperintense right frontal  skull lesion. Prior anterior cervical fusion. Sinuses/Orbits: Unremarkable. Other: None. IMPRESSION: 1. No acute intracranial abnormality. 2. Moderate ventriculomegaly, mildly progressed from 2015 and favored to reflect cerebral atrophy over hydrocephalus. 3. Mild cerebral white matter disease, slightly progressed from 2015 and nonspecific but compatible with mild chronic small vessel ischemic disease. Electronically Signed   By: Logan Bores M.D.   On: 09/24/2015 15:11   Dg Abd Portable 1v  Result Date: 09/25/2015 CLINICAL DATA:  Constipation with abdominal pain and distention. EXAM: PORTABLE ABDOMEN - 1 VIEW COMPARISON:  CT scan 10/01/2013. FINDINGS: Portable supine abdomen at 1658 hours shows no gaseous small bowel dilatation suggest obstruction. Air seen scattered along any nondilated colon without a substantial stool volume evident. Degenerative changes noted lower lumbar  spine. IMPRESSION: Nonspecific bowel gas pattern. Electronically Signed   By: Misty Stanley M.D.   On: 09/25/2015 17:09        Scheduled Meds: . amLODipine  5 mg Oral Daily  . aspirin  81 mg Oral Once per day on Mon Fri  . brimonidine  1 drop Both Eyes QHS  . cefTRIAXone (ROCEPHIN)  IV  1 g Intravenous Q24H  . enoxaparin (LOVENOX) injection  40 mg Subcutaneous Q24H  . escitalopram  20 mg Oral Daily  . LORazepam  0.5 mg Oral BH-q7a  . OLANZapine  10 mg Oral QHS  . OLANZapine  10 mg Oral QHS  . polyethylene glycol  17 g Oral BID  . rosuvastatin  10 mg Oral Once per day on Wed Sat  . sodium chloride flush  3 mL Intravenous Q12H  . vitamin E  400 Units Oral Once per day on Mon Wed Fri   Continuous Infusions: . sodium chloride 75 mL/hr at 09/25/15 1049     LOS: 2 days     Cordelia Poche, MD [facility] 09/26/2015, 12:45 PM Pager: [phone]  If 7PM-7AM, please contact night-coverage [email or web address] Password [email or web address] 09/26/2015, 12:45 PM

## 2015-09-26 NOTE — Progress Notes (Signed)
PT Cancellation Note  Patient Details Name: MARUS WOOLLEY MRN: [id number] DOB: May 15, 1931   Cancelled Treatment:    Reason Eval/Treat Not Completed: Patient not medically ready.  Patient remains on strict bedrest per orders.  MD:  Please clarify - strict bedrest and OOB with assist orders written at same time. Thank you.   Despina Pole 09/26/2015, 8:51 AM Carita Pian. Sanjuana Kava, [place] Pager [phone]

## 2015-09-27 ENCOUNTER — Encounter (HOSPITAL_COMMUNITY): Payer: Self-pay | Admitting: Student

## 2015-09-27 LAB — GLUCOSE, CAPILLARY: GLUCOSE-CAPILLARY: 95 mg/dL (ref 65–99)

## 2015-09-27 MED ORDER — BISACODYL 10 MG RE SUPP
10.0000 mg | Freq: Once | RECTAL | Status: AC
  Administered 2015-09-27: 10 mg via RECTAL
  Filled 2015-09-27: qty 1

## 2015-09-27 NOTE — Evaluation (Signed)
Physical Therapy Evaluation Patient Details Name: [name] MRN: [id number] DOB: 13-Sep-1931 Today's Date: 09/27/2015   History of Present Illness  [name] is a 82 y.o. male with medical history significant of hypertension, hyperlipidemia, GERD, depression, anxiety, dementia, CAD, s/p of CABG 1998, remote throat cancer, BPH, AAA, CKD-III, who presents with altered mental status.   Clinical Impression  Patient presents with decreased independence with mobility due to deficits listed in PT problem list.  He has been on bedrest several days and currently mainly limited by balance deficit.  Feel he needs SNF level rehab prior to d/c home with family assist.     Follow Up Recommendations SNF;Supervision/Assistance - 24 hour    Equipment Recommendations  Other (comment) (TBA)    Recommendations for Other Services       Precautions / Restrictions Precautions Precautions: Fall      Mobility  Bed Mobility Overal bed mobility: Needs Assistance Bed Mobility: Rolling;Sidelying to Sit Rolling: Mod assist Sidelying to sit: Max assist       General bed mobility comments: assist to bring legs off bed and lift trunk  Transfers Overall transfer level: Needs assistance Equipment used: None;Rolling walker (2 wheeled) Transfers: Sit to/from [employer] Transfers Sit to Stand: Max assist;+2 physical assistance Stand pivot transfers: Max assist;+2 physical assistance       General transfer comment: attempted x 3 to transfer pt with +1 A, but pt fearful and keeping legs braced against bed in standing and unable to come forward with posterior bias,  assisted to pivot wtih +2 A on either side  Ambulation/Gait             General Gait Details: unable  Stairs            Wheelchair Mobility    Modified Rankin (Stroke Patients Only)       Balance Overall balance assessment: Needs assistance   Sitting balance-Leahy Scale: Poor Sitting balance - Comments: assist  initially mod A for balance, then able to leave with close S and cues for about 10 seconds prior to needing min A,  Sat EOB about 10 minutes Postural control: Posterior lean   Standing balance-Leahy Scale: Zero Standing balance comment: needs +2 A for safety in standing due to posterior lean                             Pertinent Vitals/Pain Pain Assessment: Faces Faces Pain Scale: Hurts little more Pain Location: neck Pain Descriptors / Indicators: Aching;Tightness Pain Intervention(s): Monitored during session;Heat applied;Repositioned    Home Living Family/patient expects to be discharged to:: Skilled nursing facility Living Arrangements: Alone Available Help at Discharge: Family Type of Home: House Home Access: Stairs to enter   Technical brewer of Steps: 1 Home Layout: One level Home Equipment: Cane - single point      Prior Function Level of Independence: Needs assistance   Gait / Transfers Assistance Needed: occasionally held the wall, was independent  ADL's / Homemaking Assistance Needed: assist for meals and bathing        Hand Dominance   Dominant Hand: Right    Extremity/Trunk Assessment   Upper Extremity Assessment: Defer to OT evaluation (c/o R UE pain with movement)           Lower Extremity Assessment: Generalized weakness      Cervical / Trunk Assessment: Kyphotic  Communication   Communication: HOH  Cognition Arousal/Alertness: Lethargic Behavior During Therapy:  WFL for tasks assessed/performed Overall Cognitive Status: Impaired/Different from baseline Area of Impairment: Orientation;Attention;Memory;Following commands;Safety/judgement Orientation Level: Disoriented to;Place;Time;Situation Current Attention Level: Focused Memory: Decreased short-term memory Following Commands: Follows one step commands inconsistently Safety/Judgement: Decreased awareness of safety;Decreased awareness of deficits     General Comments:  had baseline dementia with STM loss, but now much worse per daughters    General Comments General comments (skin integrity, edema, etc.): daughter presents and requesting assist to decide if they can take him home.  Educated on amount of assist pt needing and on recommendations for rehab    Exercises        Assessment/Plan    PT Assessment Patient needs continued PT services  PT Diagnosis Generalized weakness;Abnormality of gait   PT Problem List Decreased strength;Decreased balance;Decreased cognition;Decreased mobility;Decreased safety awareness;Decreased coordination  PT Treatment Interventions DME instruction;Therapeutic activities;Cognitive remediation;Gait training;Patient/family education;Therapeutic exercise;Balance training;Functional mobility training   PT Goals (Current goals can be found in the Care Plan section) Acute Rehab PT Goals Patient Stated Goal: To return home with assist eventually PT Goal Formulation: With family Time For Goal Achievement: 10/11/15 Potential to Achieve Goals: Fair    Frequency Min 3X/week   Barriers to discharge        Co-evaluation               End of Session Equipment Utilized During Treatment: Gait belt Activity Tolerance: Patient limited by fatigue Patient left: with call bell/phone within reach;in chair;with chair alarm set Nurse Communication: Mobility status         Time: [id number] PT Time Calculation (min) (ACUTE ONLY): 24 min   Charges:   PT Evaluation $PT Eval Moderate Complexity: 1 Procedure PT Treatments $Therapeutic Activity: 8-22 mins   PT G CodesReginia Naas 28-Sep-2015, 5:08 PM  Magda Kiel, [place] 09-28-15

## 2015-09-27 NOTE — Progress Notes (Addendum)
PROGRESS NOTE    [name]  [id number] DOB: [birth year] DOA: 09/23/2015 PCP: Garret Reddish, MD   Brief Narrative: [name] is a 82 y.o. male with medical history significant of hypertension, hyperlipidemia, GERD, depression, anxiety, dementia, CAD, s/p of CABG 1998, remote throat cancer, BPH, AAA, CKD-III, who presents with altered mental status.   Per his daughter, pt has been confused in the past 2 days. He is also agitated. Has chronic neck pain, which is at his baseline. Per his daughter, patient moves all extremities normally. No vision change or hearing loss. Daughter states that patient has been more sedentary since Jun 06, 2022 when his wife died. Per his daughter, patient does not have nausea, vomiting, diarrhea, abdominal pain. He has urge to urinate per his daughter. Not sure if he has dysuria or burning on urination.   Assessment & Plan:   Principal Problem:   Acute encephalopathy Active Problems:   Hyperlipidemia   Essential hypertension   CAD (coronary artery disease) s/p bypass graft   PVD- hx CEA,patent carotids by doppler July 2015   CKD (chronic kidney disease), stage III   Depression   UTI (lower urinary tract infection)   Acute encephalopathy Agitation I really think this is secondary to decline in cognitive function secondary to dementia. Patient does have some some distention. KUB yesterday was not specific for etiology.  -Continue zyprexa to 10mg  qhs -discontinue Haldol when necessary -Continue Ativan when necessary -will attempt to get him off of restraints  Abdominal distension KUB not significant for obstruction and minimal stool burden. Patient has not had a bowel movement in days -dulcolax suppository x1 -continue miralax  ?UTI Urinalysis only significant for leukocytes. Urine culture without significant growth. Ceftriaxone discontinued.  Hyperlipidemia -Continue Crestor  Hypertension -Continue amlodipine  Coronary artery disease Patient  status post bypass graft. Currently has no chest pain. Troponin negative -Continue aspirin and Crestor  Chronic kidney disease, stage III Currently back at baseline  Hypokalemia Slightly low  DVT prophylaxis: Lovenox subcutaneous  Code Status: Full code Family Communication: Two daughters at bedside Disposition Plan: Anticipate discharge to SNF after mental status improves and he is off restraints for 24 hours.   Consultants:   None   Procedures:   None   Antimicrobials:  Ceftriaxone (9/8>> 9/10)   Subjective: Patient states his belly is full. Otherwise no complaints. He was trying to get out of bed to urinate  Objective: Vitals:   09/26/15 2152 09/27/15 0542 09/27/15 1025 09/27/15 1305  BP: 134/85 (!) 151/85 (!) 147/79 (!) 135/92  Pulse: 61 80 76 64  Resp: 18 20  18   Temp: 98.5 F (36.9 C) 98.5 F (36.9 C)  98.1 F (36.7 C)  TempSrc: Oral Oral  Oral  SpO2: 91% 94%  94%  Weight:      Height:        Intake/Output Summary (Last 24 hours) at 09/27/15 1335 Last data filed at 09/27/15 1236  Gross per 24 hour  Intake          1983.75 ml  Output             2500 ml  Net          -516.25 ml   Filed Weights   09/24/15 0102  Weight: 86.4 kg (190 lb 7.6 oz)    Examination:  General exam: Appears calm and comfortable. Restraint mittens on right hand. Somnolent and arouses easily Respiratory system: Clear to auscultation. Respiratory effort normal. Cardiovascular system: S1 & S2  heard, RRR. No murmurs, rubs, gallops or clicks. Gastrointestinal system: Abdomen is nondistended, soft and nontender. No organomegaly or masses felt. Normal bowel sounds heard. Central nervous system: Alert and oriented x1. No focal neurological deficits. Difficult to arouse. Extremities: No cyanosis, no edema  Skin: No rashes, lesions or ulcers Psychiatry: Mood & affect slightly flat    Data Reviewed: I have personally reviewed following labs and imaging studies  CBC:  Recent  Labs Lab 09/23/15 1950 09/24/15 0629  WBC 8.5 6.3  HGB 13.8 12.7*  HCT 42.3 39.2  MCV 96.6 96.3  PLT 173 [id number]   Basic Metabolic Panel:  Recent Labs Lab 09/23/15 1950 09/24/15 0629 09/25/15 0721  NA 138 141 140  K 3.7 3.4* 3.6  CL 110 112* 110  CO2 18* 21* 19*  GLUCOSE 109* 80 79  BUN 16 16 14   CREATININE 1.27* 1.14 1.03  CALCIUM 8.3* 8.0* 8.2*   GFR: Estimated Creatinine Clearance: 60.7 mL/min (by C-G formula based on SCr of 1.03 mg/dL). Liver Function Tests:  Recent Labs Lab 09/23/15 1950  AST 24  ALT 18  ALKPHOS 64  BILITOT 1.9*  PROT 6.0*  ALBUMIN 3.4*   No results for input(s): LIPASE, AMYLASE in the last 168 hours. No results for input(s): AMMONIA in the last 168 hours. Coagulation Profile: No results for input(s): INR, PROTIME in the last 168 hours. Cardiac Enzymes:  Recent Labs Lab 09/24/15 0830  TROPONINI <0.03   BNP (last 3 results) No results for input(s): PROBNP in the last 8760 hours. HbA1C: No results for input(s): HGBA1C in the last 72 hours. CBG:  Recent Labs Lab 09/23/15 1959 09/24/15 0804 09/25/15 0755 09/26/15 0808 09/27/15 0804  GLUCAP 104* 78 83 99 95   Lipid Profile: No results for input(s): CHOL, HDL, LDLCALC, TRIG, CHOLHDL, LDLDIRECT in the last 72 hours. Thyroid Function Tests: No results for input(s): TSH, T4TOTAL, FREET4, T3FREE, THYROIDAB in the last 72 hours. Anemia Panel: No results for input(s): VITAMINB12, FOLATE, FERRITIN, TIBC, IRON, RETICCTPCT in the last 72 hours. Sepsis Labs:  Recent Labs Lab 09/23/15 2001 09/23/15 2339  LATICACIDVEN 0.64 0.51    Recent Results (from the past 240 hour(s))  Urine culture     Status: Abnormal   Collection Time: 09/23/15  8:59 PM  Result Value Ref Range Status   Specimen Description URINE, RANDOM  Final   Special Requests NONE  Final   Culture 1,000 COLONIES/mL INSIGNIFICANT GROWTH (A)  Final   Report Status 09/25/2015 FINAL  Final         Radiology  Studies: Dg Abd Portable 1v  Result Date: 09/25/2015 CLINICAL DATA:  Constipation with abdominal pain and distention. EXAM: PORTABLE ABDOMEN - 1 VIEW COMPARISON:  CT scan 10/01/2013. FINDINGS: Portable supine abdomen at 1658 hours shows no gaseous small bowel dilatation suggest obstruction. Air seen scattered along any nondilated colon without a substantial stool volume evident. Degenerative changes noted lower lumbar spine. IMPRESSION: Nonspecific bowel gas pattern. Electronically Signed   By: Misty Stanley M.D.   On: 09/25/2015 17:09        Scheduled Meds: . amLODipine  5 mg Oral Daily  . aspirin  81 mg Oral Once per day on Mon Fri  . brimonidine  1 drop Both Eyes QHS  . enoxaparin (LOVENOX) injection  40 mg Subcutaneous Q24H  . escitalopram  20 mg Oral Daily  . LORazepam  0.5 mg Oral BH-q7a  . OLANZapine  10 mg Oral QHS  . OLANZapine  10 mg  Oral QHS  . polyethylene glycol  17 g Oral BID  . rosuvastatin  10 mg Oral Once per day on Wed Sat  . sodium chloride flush  3 mL Intravenous Q12H  . vitamin E  400 Units Oral Once per day on Mon Wed Fri   Continuous Infusions: . sodium chloride 75 mL/hr at 09/26/15 1540     LOS: 3 days     Cordelia Poche, MD [facility] 09/27/2015, 1:35 PM Pager: [phone]  If 7PM-7AM, please contact night-coverage [email or web address] Password [email or web address] 09/27/2015, 1:35 PM

## 2015-09-27 NOTE — Progress Notes (Signed)
PT Cancellation Note  Patient Details Name: [name] MRN: [id number] DOB: 1931/01/23   Cancelled Treatment:    Reason Eval/Treat Not Completed: Other (comment); patient seeing MD this morning, then nursing in to give Ativan and enema.  Will attempt later as time permits   Reginia Naas 09/27/2015, 11:49 AM  Magda Kiel, [place] 09/27/2015

## 2015-09-27 NOTE — NC FL2 (Signed)
[place] LEVEL OF CARE SCREENING TOOL     IDENTIFICATION  Patient Name: [name] Birthdate: 07-09-1931 Sex: male Admission Date (Current Location): 09/23/2015  [facility] and [place] Number:  Herbalist and Address:  [facility], [place] [address] [place], [place] [address]      Provider Number: [id number]  Attending Physician Name and Address:  Mariel Aloe, MD  Relative Name and Phone Number:       Current Level of Care: Hospital Recommended Level of Care: [place] Prior Approval Number:    Date Approved/Denied:   PASRR Number: [id number]  Discharge Plan: SNF    Current Diagnoses: Patient Active Problem List   Diagnosis Date Noted  . UTI (lower urinary tract infection) 09/23/2015  . Acute encephalopathy 09/23/2015  . Depression 10/24/2013  . Postoperative hematoma 10/01/2013  . CKD (chronic kidney disease), stage III 09/28/2013  . Right inguinal hernia 09/26/2013  . AAA (abdominal aortic aneurysm) without rupture ([place]) 07/22/2013  . Cervical spondylosis without myelopathy 06/19/2013  . Memory deficits 06/19/2013  . PVD- hx CEA,patent carotids by doppler July 2015 09/26/2010  . 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 Essential hypertension 03/16/2009  . Low back pain potentially associated with radiculopathy 03/19/2008  . BPH (benign prostatic hyperplasia) 11/25/2007  . GERD 10/24/2006  . Hyperlipidemia 09/14/2006  . CAD (coronary artery disease) s/p bypass graft 09/14/2006  . NEPHROLITHIASIS, HX OF 09/14/2006    Orientation RESPIRATION BLADDER Height & Weight     Self  Normal External catheter Weight: 86.4 kg (190 lb 7.6 oz) Height:  6' (182.9 cm)  BEHAVIORAL SYMPTOMS/MOOD NEUROLOGICAL BOWEL NUTRITION STATUS      Incontinent Diet (cardiac)  AMBULATORY STATUS COMMUNICATION OF NEEDS Skin   Extensive Assist Verbally Normal                       Personal Care Assistance Level of  Assistance  Bathing, Dressing Bathing Assistance: Maximum assistance   Dressing Assistance: Maximum assistance     Functional Limitations Info             SPECIAL CARE FACTORS FREQUENCY  PT (By licensed PT), OT (By licensed OT)     PT Frequency: 5/wk OT Frequency: 5/wk            Contractures      Additional Factors Info  Code Status, Allergies, Psychotropic Code Status Info: FULL Allergies Info: Avinza Morphine Sulfate, Doxycycline Calcium, Vibramycin Doxycycline Hyclate, Flomax Tamsulosin Hcl, Statins, Codeine Sulfate, Oxycodone Psychotropic Info: ativan         Current Medications (09/27/2015):  This is the current hospital active medication list Current Facility-Administered Medications  Medication Dose Route Frequency Provider Last Rate Last Dose  . 0.9 %  sodium chloride infusion   Intravenous Continuous Ivor Costa, MD 75 mL/hr at 09/26/15 1540    . acetaminophen (TYLENOL) tablet 650 mg  650 mg Oral Q6H PRN Ivor Costa, MD   650 mg at 09/26/15 1545   Or  . acetaminophen (TYLENOL) suppository 650 mg  650 mg Rectal Q6H PRN Ivor Costa, MD      . albuterol (PROVENTIL) (2.5 MG/3ML) 0.083% nebulizer solution 2.5 mg  2.5 mg Nebulization Q6H PRN Jeryl Columbia, NP   2.5 mg at 09/26/15 2106  . amLODipine (NORVASC) tablet 5 mg  5 mg Oral Daily Ivor Costa, MD   5 mg at 09/27/15 1037  . aspirin chewable tablet 81 mg  81 mg Oral Once per day on Mon Fri Ivor Costa, MD   81 mg at 09/27/15 1037  . brimonidine (ALPHAGAN) 0.2 % ophthalmic solution 1 drop  1 drop Both Eyes QHS Ivor Costa, MD   1 drop at 09/26/15 2014  . enoxaparin (LOVENOX) injection 40 mg  40 mg Subcutaneous Q24H Ivor Costa, MD   40 mg at 09/27/15 1156  . escitalopram (LEXAPRO) tablet 20 mg  20 mg Oral Daily Ivor Costa, MD   20 mg at 09/27/15 1037  . hydrALAZINE (APRESOLINE) injection 5 mg  5 mg Intravenous Q2H PRN Ivor Costa, MD      . LORazepam (ATIVAN) injection 0.5 mg  0.5 mg Intravenous Q6H PRN Ivor Costa, MD   0.5  mg at 09/26/15 2012  . LORazepam (ATIVAN) tablet 0.5 mg  0.5 mg Oral BH-q7a Mariel Aloe, MD   0.5 mg at 09/27/15 0942  . OLANZapine (ZYPREXA) tablet 10 mg  10 mg Oral QHS Mariel Aloe, MD      . OLANZapine [facility]) tablet 10 mg  10 mg Oral QHS Rhetta Mura Schorr, NP   10 mg at 09/26/15 2014  . ondansetron (ZOFRAN) tablet 4 mg  4 mg Oral Q6H PRN Ivor Costa, MD       Or  . ondansetron [facility]) injection 4 mg  4 mg Intravenous Q6H PRN Ivor Costa, MD      . polyethylene glycol (MIRALAX / GLYCOLAX) packet 17 g  17 g Oral BID Mariel Aloe, MD   17 g at 09/27/15 1151  . rosuvastatin (CRESTOR) tablet 10 mg  10 mg Oral Once per day on Wed Sat Ivor Costa, MD   10 mg at 09/25/15 0959  . sodium chloride flush (NS) 0.9 % injection 3 mL  3 mL Intravenous Q12H Ivor Costa, MD   3 mL at 09/26/15 0826  . vitamin E capsule 400 Units  400 Units Oral Once per day on Mon Wed Fri Ivor Costa, MD   400 Units at 09/24/15 1354     Discharge Medications: Please see discharge summary for a list of discharge medications.  Relevant Imaging Results:  Relevant Lab Results:   Additional Information SS#: [id number]  Jorge Ny, LCSW

## 2015-09-27 NOTE — Clinical Social Work Note (Signed)
Clinical Social Work Assessment  Patient Details  Name: [name] MRN: [id number] Date of Birth: Nov 20, 1931  Date of referral:  09/27/15               Reason for consult:  Facility Placement                Permission sought to share information with:  Chartered certified accountant granted to share information::  Yes, Verbal Permission Granted  Name::     Biomedical engineer::  SNFs  Relationship::  dtrs  Contact Information:     Housing/Transportation Living arrangements for the past 2 months:  Single Family Home Source of Information:  Adult Children Patient Interpreter Needed:  None Criminal Activity/Legal Involvement Pertinent to Current Situation/Hospitalization:  No - Comment as needed Significant Relationships:  Adult Children Lives with:  Self (with help from his children- only at night alone) Do you feel safe going back to the place where you live?  No Need for family participation in patient care:  Yes (Comment) (decision making)  Care giving concerns:  Pt normally living at home with aid from children who alternate being with patient.  Per dtrs pt normally with short term memory issues like remembering what time it is but has recently had issues with thinking it is a different year or that his wife is still alive, also with increased concern for physical wellbeing- states pt started shuffling at home prior to admission and has been unable to do activities normally.   Social Worker assessment / plan:  CSW spoke with pt dtrs [name] and [name] at bedside concerning possible SNF placement.  Dtrs state that they would prefer to take patient home if possible but understand that they are not capable of taking care of his needs if he is not cooperative with them/too weak.  State that pts wife (who died in 2022-05-24) was at [facility] before she passed and understand SNF referral process.  Employment status:  Retired Forensic scientist:  Medicare PT Recommendations:  Not  assessed at this time [place] / Referral to community resources:  [place]  Patient/Family's Response to care:  Pt dtrs agreeable to SNF if PT proves he is requiring high level of assistance at this time- unsure which facility they would like but considering [facility].  Patient/Family's Understanding of and Emotional Response to Diagnosis, Current Treatment, and Prognosis:  Family express understanding of pt current needs/treatment plan but are concerned if pt baseline functioning will be altered following this admission.  Emotional Assessment Appearance:  Appears stated age Attitude/Demeanor/Rapport:  Unable to Assess Affect (typically observed):  Unable to Assess Orientation:  Oriented to Self Alcohol / Substance use:  Not Applicable Psych involvement (Current and /or in the community):  No (Comment)  Discharge Needs  Concerns to be addressed:  Care Coordination Readmission within the last 30 days:  No Current discharge risk:  Physical Impairment Barriers to Discharge:  Continued Medical Work up   Jorge Ny, LCSW 09/27/2015, 2:39 PM

## 2015-09-27 NOTE — Care Management Important Message (Signed)
Important Message  Patient Details  Name: [name] MRN: [id number] Date of Birth: 1931-08-29   Medicare Important Message Given:  Yes    [name] 09/27/2015, 2:20 PM

## 2015-09-28 LAB — BASIC METABOLIC PANEL
Anion gap: 9 (ref 5–15)
BUN: 11 mg/dL (ref 6–20)
CHLORIDE: 108 mmol/L (ref 101–111)
CO2: 23 mmol/L (ref 22–32)
CREATININE: 0.98 mg/dL (ref 0.61–1.24)
Calcium: 8.6 mg/dL — ABNORMAL LOW (ref 8.9–10.3)
GFR calc non Af Amer: >60 mL/min (ref >60)
GLUCOSE: 92 mg/dL (ref 65–99)
Potassium: 3.6 mmol/L (ref 3.5–5.1)
Sodium: 140 mmol/L (ref 135–145)

## 2015-09-28 LAB — GLUCOSE, CAPILLARY: Glucose-Capillary: 87 mg/dL (ref 65–99)

## 2015-09-28 MED ORDER — ACETAMINOPHEN 500 MG PO TABS
1000.0000 mg | ORAL_TABLET | Freq: Three times a day (TID) | ORAL | Status: DC
  Administered 2015-09-28 – 2015-09-29 (×3): 1000 mg via ORAL
  Filled 2015-09-28 (×4): qty 2

## 2015-09-28 MED ORDER — ASPIRIN 81 MG PO CHEW
81.0000 mg | CHEWABLE_TABLET | Freq: Every day | ORAL | Status: DC
  Administered 2015-09-29: 81 mg via ORAL
  Filled 2015-09-28: qty 1

## 2015-09-28 MED ORDER — OLANZAPINE 5 MG PO TABS
5.0000 mg | ORAL_TABLET | Freq: Every day | ORAL | Status: DC
  Administered 2015-09-28: 5 mg via ORAL
  Filled 2015-09-28 (×2): qty 1

## 2015-09-28 NOTE — Progress Notes (Signed)
PROGRESS NOTE    SHOLOM BULA  [id number] DOB: Jul 17, 1931 DOA: 09/23/2015 PCP: Garret Reddish, MD   Brief Narrative: [name] is a 82 y.o. male with medical history significant of hypertension, hyperlipidemia, GERD, depression, anxiety, dementia, CAD, s/p of CABG 1998, remote throat cancer, BPH, AAA, CKD-III, who presents with altered mental status.   Per his daughter, pt has been confused in the past 2 days. He is also agitated. Has chronic neck pain, which is at his baseline. Per his daughter, patient moves all extremities normally. No vision change or hearing loss. Daughter states that patient has been more sedentary since 06-15-2022 when his wife died. Per his daughter, patient does not have nausea, vomiting, diarrhea, abdominal pain. He has urge to urinate per his daughter. Not sure if he has dysuria or burning on urination.   Assessment & Plan:   Principal Problem:   Acute encephalopathy Active Problems:   Hyperlipidemia   Essential hypertension   CAD (coronary artery disease) s/p bypass graft   PVD- hx CEA,patent carotids by doppler July 2015   CKD (chronic kidney disease), stage III   Depression   UTI (lower urinary tract infection)   Acute encephalopathy Agitation I really think this is secondary to decline in cognitive function secondary to dementia. Patient does have some some distention. KUB yesterday was not specific for etiology. Patient difficult to arouse, however, just received ativan -Continue zyprexa to 10mg  qhs -discontinue Haldol when necessary -Continue Ativan when necessary  Abdominal distension KUB not significant for obstruction and minimal stool burden. Patient has not had a bowel movement in days -continue miralax  ?UTI Urinalysis only significant for leukocytes. Urine culture without significant growth. Ceftriaxone discontinued.  Hyperlipidemia -Continue Crestor  Hypertension -Continue amlodipine  Coronary artery disease Patient status post  bypass graft. Currently has no chest pain. Troponin negative -Continue aspirin and Crestor  Chronic kidney disease, stage III Currently back at baseline  Hypokalemia Slightly low  DVT prophylaxis: Lovenox subcutaneous  Code Status: Full code Family Communication: Two daughters at bedside Disposition Plan: Anticipate discharge to SNF after mental status improves and he is off restraints for 24 hours.   Consultants:   None   Procedures:   None   Antimicrobials:  Ceftriaxone (9/8>> 9/10)   Subjective: Difficult to around. He awakes to stimulation but falls back asleep  Objective: Vitals:   09/27/15 1305 09/27/15 2243 09/28/15 0714 09/28/15 1426  BP: (!) 135/92 (!) 148/88 (!) 152/85 115/65  Pulse: 64 82 89 69  Resp: 18 20 18 20   Temp: 98.1 F (36.7 C) 99.3 F (37.4 C)  99.4 F (37.4 C)  TempSrc: Oral Oral  Oral  SpO2: 94% 94% 96% 94%  Weight:      Height:        Intake/Output Summary (Last 24 hours) at 09/28/15 1645 Last data filed at 09/28/15 1531  Gross per 24 hour  Intake           2682.5 ml  Output              300 ml  Net           2382.5 ml   Filed Weights   09/24/15 0102  Weight: 86.4 kg (190 lb 7.6 oz)    Examination:  General exam: Somnolent Respiratory system: Clear to auscultation. Respiratory effort normal. Cardiovascular system: S1 & S2 heard, RRR. No murmurs, rubs, gallops or clicks. Gastrointestinal system: Abdomen is  Slightly distended, soft and nontender. No organomegaly or  masses felt. Normal bowel sounds heard. Central nervous system: Alert and oriented x1. No focal neurological deficits. Difficult to arouse. Extremities: No cyanosis, no edema  Skin: No rashes, lesions or ulcers Psychiatry: Mood & affect slightly flat    Data Reviewed: I have personally reviewed following labs and imaging studies  CBC:  Recent Labs Lab 09/23/15 1950 09/24/15 0629  WBC 8.5 6.3  HGB 13.8 12.7*  HCT 42.3 39.2  MCV 96.6 96.3  PLT 173 164     Basic Metabolic Panel:  Recent Labs Lab 09/23/15 1950 09/24/15 0629 09/25/15 0721 09/28/15 0956  NA 138 141 140 140  K 3.7 3.4* 3.6 3.6  CL 110 112* 110 108  CO2 18* 21* 19* 23  GLUCOSE 109* 80 79 92  BUN 16 16 14 11   CREATININE 1.27* 1.14 1.03 0.98  CALCIUM 8.3* 8.0* 8.2* 8.6*   GFR: Estimated Creatinine Clearance: 63.8 mL/min (by C-G formula based on SCr of 0.98 mg/dL). Liver Function Tests:  Recent Labs Lab 09/23/15 1950  AST 24  ALT 18  ALKPHOS 64  BILITOT 1.9*  PROT 6.0*  ALBUMIN 3.4*   No results for input(s): LIPASE, AMYLASE in the last 168 hours. No results for input(s): AMMONIA in the last 168 hours. Coagulation Profile: No results for input(s): INR, PROTIME in the last 168 hours. Cardiac Enzymes:  Recent Labs Lab 09/24/15 0830  TROPONINI <0.03   BNP (last 3 results) No results for input(s): PROBNP in the last 8760 hours. HbA1C: No results for input(s): HGBA1C in the last 72 hours. CBG:  Recent Labs Lab 09/24/15 0804 09/25/15 0755 09/26/15 0808 09/27/15 0804 09/28/15 0810  GLUCAP 78 83 99 95 87   Lipid Profile: No results for input(s): CHOL, HDL, LDLCALC, TRIG, CHOLHDL, LDLDIRECT in the last 72 hours. Thyroid Function Tests: No results for input(s): TSH, T4TOTAL, FREET4, T3FREE, THYROIDAB in the last 72 hours. Anemia Panel: No results for input(s): VITAMINB12, FOLATE, FERRITIN, TIBC, IRON, RETICCTPCT in the last 72 hours. Sepsis Labs:  Recent Labs Lab 09/23/15 2001 09/23/15 2339  LATICACIDVEN 0.64 0.51    Recent Results (from the past 240 hour(s))  Urine culture     Status: Abnormal   Collection Time: 09/23/15  8:59 PM  Result Value Ref Range Status   Specimen Description URINE, RANDOM  Final   Special Requests NONE  Final   Culture 1,000 COLONIES/mL INSIGNIFICANT GROWTH (A)  Final   Report Status 09/25/2015 FINAL  Final         Radiology Studies: No results found.      Scheduled Meds: . acetaminophen  1,000 mg  Oral Q8H  . [START ON 09/29/2015] aspirin  81 mg Oral Daily  . brimonidine  1 drop Both Eyes QHS  . enoxaparin (LOVENOX) injection  40 mg Subcutaneous Q24H  . escitalopram  20 mg Oral Daily  . LORazepam  0.5 mg Oral BH-q7a  . OLANZapine  5 mg Oral QHS  . polyethylene glycol  17 g Oral BID  . sodium chloride flush  3 mL Intravenous Q12H   Continuous Infusions: . sodium chloride 75 mL/hr at 09/28/15 1522     LOS: 4 days     Cordelia Poche, MD [facility] 09/28/2015, 4:45 PM Pager: [phone]  If 7PM-7AM, please contact night-coverage [email or web address] Password [facility] 09/28/2015, 4:45 PM

## 2015-09-28 NOTE — Care Management Note (Addendum)
Case Management Note  Patient Details  Name: [name] MRN: [id number] Date of Birth: [birth year]  Subjective/Objective:             Patient admitted with acute encephalopathy.        Action/Plan:  Will DC to SNF when medically stable as facilitated by CSW. Expected Discharge Date:                  Expected Discharge Plan:  Skilled Nursing Facility  In-House Referral:  Clinical Social Work  Discharge planning Services  CM Consult  Post Acute Care Choice:  NA Choice offered to:  NA  DME Arranged:  N/A DME Agency:  NA  HH Arranged:  NA HH Agency:  NA  Status of Service:  Completed, signed off  If discussed at [place] of Stay Meetings, dates discussed:    Additional Comments:  Carles Collet, RN 09/28/2015, 12:07 PM

## 2015-09-28 NOTE — Progress Notes (Signed)
Mr. Sisco is overly sedated he is currently unarousable to loud verbal commands and physical stimulation- deep sternal rub elicits some response. He has not taken oral meds due to sedation. He is not in appropriate or safe condition to discharge. He needs to be observed until responsiveness improves. I suspect build up of sedating medications and day night cycle dysfunction.  Full palliative consult to follow.  Lane Hacker, DO Palliative Medicine [phone]

## 2015-09-28 NOTE — Progress Notes (Signed)
Pt had 15 run non sustained V tach, VS WDL, MD notified at 2312. Pt denied chest pain, SOB, resting. Magnesium ordered.

## 2015-09-28 NOTE — Progress Notes (Signed)
Bed offers provided- no choice at this time  Jorge Ny, [place] Social Worker [phone]

## 2015-09-28 NOTE — Progress Notes (Signed)
PT Cancellation Note  Patient Details Name: [name] MRN: [id number] DOB: [birth year]   Cancelled Treatment:    Reason Eval/Treat Not Completed: Fatigue/lethargy limiting ability to participate PT will check on pt later as time allows.    Salina April, PTA Pager: [phone]   09/28/2015, 9:00 AM

## 2015-09-28 NOTE — Progress Notes (Signed)
Physical Therapy Treatment Patient Details Name: [name] MRN: [id number] DOB: 1932/01/06 Today's Date: 09/28/2015    History of Present Illness [name] is a 82 y.o. male with medical history significant of hypertension, hyperlipidemia, GERD, depression, anxiety, dementia, CAD, s/p of CABG 1998, remote throat cancer, BPH, AAA, CKD-III, who presents with altered mental status.     PT Comments    Patient required max A +2 for attempt of sit to stand. Unable to achieve full standing this session. Pt limited by fatigue and attempted to lay back down throughout session. Pt oriented to person but unable to state birth date. Current plan remains appropriate.    Follow Up Recommendations  SNF;Supervision/Assistance - 24 hour     Equipment Recommendations  Other (comment)    Recommendations for Other Services       Precautions / Restrictions Precautions Precautions: Fall Restrictions Weight Bearing Restrictions: No    Mobility  Bed Mobility Overal bed mobility: Needs Assistance Bed Mobility: Rolling;Sidelying to Sit Rolling: Mod assist Sidelying to sit: Max assist       General bed mobility comments: assist at trunk and bilat LE to roll and come into sitting  Transfers Overall transfer level: Needs assistance Equipment used: 2 person hand held assist (face to face with gait belt) Transfers: Sit to/from Stand Sit to Stand: Max assist;+2 physical assistance;Total assist         General transfer comment: attempted sit to stand with +2 assist from elevated surface with pt's buttocks off of bed but unable to achieve upright posture; pt with posterior lean and assisted minimally initially to stand but then stopped assisting; total A to desend safely to EOB  Ambulation/Gait                 Stairs            Wheelchair Mobility    Modified Rankin (Stroke Patients Only)       Balance     Sitting balance-Leahy Scale: Fair Sitting balance - Comments:  pt able to maintain sitting balance EOB with min guard but very restless and continued to attempt to lay down                            Cognition Arousal/Alertness: Lethargic Behavior During Therapy: [facility] for tasks assessed/performed Overall Cognitive Status: Impaired/Different from baseline Area of Impairment: Orientation;Attention;Memory;Following commands;Safety/judgement;Awareness;Problem solving Orientation Level: Disoriented to;Place;Time;Situation Current Attention Level: Focused Memory: Decreased short-term memory Following Commands: Follows one step commands inconsistently Safety/Judgement: Decreased awareness of safety;Decreased awareness of deficits Awareness: Intellectual   General Comments: had baseline dementia with STM loss, but now much worse per daughters    Exercises      General Comments        Pertinent Vitals/Pain Pain Assessment: Faces Faces Pain Scale: No hurt    Home Living                      Prior Function            PT Goals (current goals can now be found in the care plan section) Acute Rehab PT Goals Patient Stated Goal: none stated Progress towards PT goals: Not progressing toward goals - comment    Frequency  Min 3X/week    PT Plan Current plan remains appropriate    Co-evaluation             End of Session Equipment Utilized During  Treatment: Gait belt Activity Tolerance: Patient limited by fatigue Patient left: in bed;with call bell/phone within reach;with bed alarm set;with family/visitor present     Time: 1341-1400 PT Time Calculation (min) (ACUTE ONLY): 19 min  Charges:  $Therapeutic Activity: 8-22 mins                    G Codes:      Salina April, PTA Pager: [phone]   09/28/2015, 3:25 PM

## 2015-09-28 NOTE — Progress Notes (Signed)
Attempted to administer morning medications. Pt refused at time as he was asleep. Will attempt when pt is awake.

## 2015-09-29 DIAGNOSIS — R531 Weakness: Secondary | ICD-10-CM | POA: Diagnosis not present

## 2015-09-29 DIAGNOSIS — W19XXXD Unspecified fall, subsequent encounter: Secondary | ICD-10-CM | POA: Diagnosis not present

## 2015-09-29 DIAGNOSIS — E441 Mild protein-calorie malnutrition: Secondary | ICD-10-CM | POA: Diagnosis not present

## 2015-09-29 DIAGNOSIS — E785 Hyperlipidemia, unspecified: Secondary | ICD-10-CM | POA: Diagnosis not present

## 2015-09-29 DIAGNOSIS — I25810 Atherosclerosis of coronary artery bypass graft(s) without angina pectoris: Secondary | ICD-10-CM | POA: Diagnosis not present

## 2015-09-29 DIAGNOSIS — R41841 Cognitive communication deficit: Secondary | ICD-10-CM | POA: Diagnosis not present

## 2015-09-29 DIAGNOSIS — F29 Unspecified psychosis not due to a substance or known physiological condition: Secondary | ICD-10-CM | POA: Diagnosis not present

## 2015-09-29 DIAGNOSIS — N200 Calculus of kidney: Secondary | ICD-10-CM | POA: Diagnosis not present

## 2015-09-29 DIAGNOSIS — I10 Essential (primary) hypertension: Secondary | ICD-10-CM | POA: Diagnosis not present

## 2015-09-29 DIAGNOSIS — N390 Urinary tract infection, site not specified: Secondary | ICD-10-CM

## 2015-09-29 DIAGNOSIS — I739 Peripheral vascular disease, unspecified: Secondary | ICD-10-CM | POA: Diagnosis not present

## 2015-09-29 DIAGNOSIS — G9340 Encephalopathy, unspecified: Principal | ICD-10-CM

## 2015-09-29 DIAGNOSIS — R2689 Other abnormalities of gait and mobility: Secondary | ICD-10-CM | POA: Diagnosis not present

## 2015-09-29 DIAGNOSIS — N183 Chronic kidney disease, stage 3 (moderate): Secondary | ICD-10-CM

## 2015-09-29 DIAGNOSIS — I2510 Atherosclerotic heart disease of native coronary artery without angina pectoris: Secondary | ICD-10-CM | POA: Diagnosis not present

## 2015-09-29 DIAGNOSIS — F0151 Vascular dementia with behavioral disturbance: Secondary | ICD-10-CM | POA: Diagnosis not present

## 2015-09-29 DIAGNOSIS — F331 Major depressive disorder, recurrent, moderate: Secondary | ICD-10-CM | POA: Diagnosis not present

## 2015-09-29 DIAGNOSIS — F0390 Unspecified dementia without behavioral disturbance: Secondary | ICD-10-CM | POA: Diagnosis not present

## 2015-09-29 DIAGNOSIS — E784 Other hyperlipidemia: Secondary | ICD-10-CM | POA: Diagnosis not present

## 2015-09-29 DIAGNOSIS — Z85818 Personal history of malignant neoplasm of other sites of lip, oral cavity, and pharynx: Secondary | ICD-10-CM | POA: Diagnosis not present

## 2015-09-29 DIAGNOSIS — N400 Enlarged prostate without lower urinary tract symptoms: Secondary | ICD-10-CM | POA: Diagnosis not present

## 2015-09-29 DIAGNOSIS — R410 Disorientation, unspecified: Secondary | ICD-10-CM | POA: Diagnosis not present

## 2015-09-29 DIAGNOSIS — R4182 Altered mental status, unspecified: Secondary | ICD-10-CM | POA: Diagnosis not present

## 2015-09-29 DIAGNOSIS — R278 Other lack of coordination: Secondary | ICD-10-CM | POA: Diagnosis not present

## 2015-09-29 LAB — MAGNESIUM: Magnesium: 2.1 mg/dL (ref 1.7–2.4)

## 2015-09-29 LAB — GLUCOSE, CAPILLARY: Glucose-Capillary: 93 mg/dL (ref 65–99)

## 2015-09-29 MED ORDER — LORAZEPAM 0.5 MG PO TABS: 0.5000 mg | ORAL_TABLET | ORAL | 0 refills | Status: AC

## 2015-09-29 MED ORDER — LORAZEPAM 0.5 MG PO TABS: 0.5000 mg | ORAL_TABLET | ORAL | 0 refills | Status: DC

## 2015-09-29 MED ORDER — DUTASTERIDE 0.5 MG PO CAPS
0.5000 mg | ORAL_CAPSULE | Freq: Every day | ORAL | Status: DC
  Administered 2015-09-29: 0.5 mg via ORAL
  Filled 2015-09-29: qty 1

## 2015-09-29 MED ORDER — LORAZEPAM 1 MG PO TABS: 1.0000 mg | ORAL_TABLET | Freq: Every day | ORAL | 0 refills | Status: DC

## 2015-09-29 MED ORDER — LORAZEPAM 1 MG PO TABS: 1.0000 mg | ORAL_TABLET | Freq: Four times a day (QID) | ORAL | 0 refills | Status: DC | PRN

## 2015-09-29 MED ORDER — OLANZAPINE 5 MG PO TABS: 5.0000 mg | ORAL_TABLET | Freq: Every day | ORAL | 3 refills | Status: AC

## 2015-09-29 MED ORDER — POLYETHYLENE GLYCOL 3350 17 G PO PACK: 17.0000 g | PACK | Freq: Two times a day (BID) | ORAL | 0 refills | Status: AC

## 2015-09-29 MED ORDER — LORAZEPAM 1 MG PO TABS: 1.0000 mg | ORAL_TABLET | Freq: Four times a day (QID) | ORAL | Status: DC | PRN

## 2015-09-29 MED ORDER — LORAZEPAM 0.5 MG PO TABS: 0.5000 mg | ORAL_TABLET | ORAL | Status: DC

## 2015-09-29 MED ORDER — ACETAMINOPHEN 500 MG PO TABS: 1000.0000 mg | ORAL_TABLET | Freq: Three times a day (TID) | ORAL | 0 refills | Status: AC

## 2015-09-29 MED ORDER — LORAZEPAM 1 MG PO TABS: 1.0000 mg | ORAL_TABLET | Freq: Every day | ORAL | Status: DC

## 2015-09-29 MED ORDER — OLANZAPINE 10 MG PO TABS
10.0000 mg | ORAL_TABLET | Freq: Three times a day (TID) | ORAL | Status: DC | PRN
  Filled 2015-09-29: qty 1

## 2015-09-29 MED ORDER — DUTASTERIDE 0.5 MG PO CAPS: 0.5000 mg | ORAL_CAPSULE | Freq: Every day | ORAL | 3 refills | Status: DC

## 2015-09-29 MED ORDER — ASPIRIN 81 MG PO TABS: 81.0000 mg | ORAL_TABLET | Freq: Every day | ORAL | 3 refills | Status: AC

## 2015-09-29 MED ORDER — CELECOXIB 100 MG PO CAPS: 100.0000 mg | ORAL_CAPSULE | Freq: Two times a day (BID) | ORAL | Status: AC

## 2015-09-29 NOTE — Discharge Summary (Signed)
PATIENT DETAILS Name: [name] Age: 82 y.o. Sex: male Date of Birth: 07-Dec-1931 MRN: [id number]. Admitting Physician: Ivor Costa, MD [id number] Yong Channel, MD  Admit Date: 09/23/2015 Discharge date: 09/29/2015  Recommendations for Outpatient Follow-up:  1. Follow up with PCP in 1-2 weeks 2. Palliative care follow-up while at SNF 3. Please obtain BMP/CBC in one week  Admitted From:  Home  Disposition: SNF   Home Health: No  Equipment/Devices: None  Discharge Condition: Guarded  CODE STATUS:  DNR  Diet recommendation:  Heart Healthy  Brief Summary: See H&P, Labs, Consult and Test reports for all details in brief, patient is a 82 y.o. male with medical history significant of hypertension, hyperlipidemia, GERD, depression, anxiety, dementia, CAD, s/p of CABG 1998, remote throat cancer, BPH, AAA, CKD-III, who presented with worsening mental status, initially thought to have encephalopathy from UTI, but now thought to have delirium related to dementia. Medications adjusted, much improved and stable for discharge. Seen by palliative care team during this hospitalization. Plans are to discharge to [facility]  Brief Hospital Course: Agitation/confusion: Secondary to delirium related to dementia. Do not think the patient has a UTI. Patient was seen by the palliative care team, medications have been adjusted. This morning he is pleasantly confused. He is being discharged to SNF with the medications as noted below. It is recommended that patient follow with palliative care while at [facility].  ? UTI: Not think he had a UTI, urine cultures show insignificant growth. No role for any antibiotics.  Hyperlipidemia:Continue Crestor  Hypertension: Blood pressure currently controlled, amlodipine has been discontinued by the palliative care team. Follow for now.  Coronary artery disease:Patient status post bypass graft. Currently has no chest pain. Troponin negative.Continue aspirin   Chronic  kidney disease, stage [id number] back at baseline  Procedures/Studies: None  Discharge Diagnoses:  Principal Problem: Confusion Active Problems:   Hyperlipidemia   Essential hypertension   CAD (coronary artery disease) s/p bypass graft   PVD- hx CEA,patent carotids by doppler July 2015   CKD (chronic kidney disease), stage III   Depression   UTI (lower urinary tract infection)   Discharge Instructions:  Activity:  As tolerated with Full fall precautions use walker/cane & assistance as needed   Discharge Instructions    Diet - low sodium heart healthy    Complete by:  As directed    Increase activity slowly    Complete by:  As directed        Medication List    STOP taking these medications   amLODipine 5 MG tablet Commonly known as:  NORVASC   ibuprofen 200 MG tablet Commonly known as:  ADVIL,MOTRIN   rosuvastatin 20 MG tablet Commonly known as:  CRESTOR   vitamin E 400 UNIT capsule     TAKE these medications   acetaminophen 500 MG tablet Commonly known as:  TYLENOL Take 2 tablets (1,000 mg total) by mouth every 8 (eight) hours. What changed:  when to take this   ALPHAGAN P 0.1 % Soln Generic drug:  brimonidine Place 1 drop into both eyes at bedtime.   aspirin 81 MG tablet Take 1 tablet (81 mg total) by mouth daily. Pt takes 1 tablet by mouth on Mon and Fri. What changed:  when to take this   celecoxib 100 MG capsule Commonly known as:  CELEBREX Take 1 capsule (100 mg total) by mouth 2 (two) times daily.   dutasteride 0.5 MG capsule Commonly known as:  AVODART Take 1 capsule (0.5 mg  total) by mouth daily.   escitalopram 20 MG tablet Commonly known as:  LEXAPRO TAKE ONE TABLET BY MOUTH ONCE DAILY What changed:  See the new instructions.   LORazepam 1 MG tablet Commonly known as:  ATIVAN Take 1 tablet (1 mg total) by mouth at bedtime. What changed:  medication strength  how much to take  when to take this  reasons to take this     LORazepam 1 MG tablet Commonly known as:  ATIVAN Take 1 tablet (1 mg total) by mouth every 6 (six) hours as needed (agitation, sundowning, may be given in addition to scheduled dose). What changed:  You were already taking a medication with the same name, and this prescription was added. Make sure you understand how and when to take each.   LORazepam 0.5 MG tablet Commonly known as:  ATIVAN Take 1 tablet (0.5 mg total) by mouth tomorrow at 10 am. For 1 dose Start taking on:  09/30/2015 What changed:  You were already taking a medication with the same name, and this prescription was added. Make sure you understand how and when to take each.   OLANZapine 5 MG tablet Commonly known as:  ZYPREXA Take 1 tablet (5 mg total) by mouth at bedtime.   polyethylene glycol packet Commonly known as:  MIRALAX / GLYCOLAX Take 17 g by mouth 2 (two) times daily.      Follow-up Information    Garret Reddish, MD. Schedule an appointment as soon as possible for a visit in 2 week(s).   Specialty:  Family Medicine Contact information: [place] [address] [phone]          Allergies  Allergen Reactions  . Avinza [Morphine Sulfate] Other (See Comments)    Altered mental status. "about killed him"  . Doxycycline Calcium Other (See Comments)    Causes patient chronic colitis; was hospitalized two weeks due to med.  . Vibramycin [Doxycycline Hyclate] Other (See Comments)    Causes patient colitis; was hospitalized two weeks due to med  . Flomax [Tamsulosin Hcl] Other (See Comments)    Dizziness  . Statins Other (See Comments)    Muscle pain; can tolerate crestor 10 mg every other day  . Codeine Sulfate Nausea And Vomiting  . Oxycodone Other (See Comments)    Angry; altered mental status; "wild and crazy"    Consultations:   Palliative Care  Other Procedures/Studies: Ct Head Wo Contrast  Result Date: 09/23/2015 CLINICAL DATA:  Confusion.  History of carotid  stenosis. EXAM: CT HEAD WITHOUT CONTRAST TECHNIQUE: Contiguous axial images were obtained from the base of the skull through the vertex without intravenous contrast. COMPARISON:  Brain MRI 06/27/2013,Head CT 05/19/2010 FINDINGS: Brain: No mass lesion, intraparenchymal hemorrhage or extra-axial collection. No evidence of acute cortical infarct. There is confluent periventricular hypoattenuation compatible with chronic microvascular disease. Prominence of the ventricles and sulci suggest age-related atrophy. Vascular: There is atherosclerotic calcification of the internal carotid artery is. No hyperdense vessel sign. Skull: Lytic lesion within the right frontal calvarium is unchanged in size, measuring 1.9 by 0.9 cm. Skull is otherwise unremarkable. Sinuses/Orbits: No sinus fluid levels or advanced mucosal thickening. No mastoid effusion. Normal orbits. IMPRESSION: 1. No acute intracranial abnormality. 2. Findings of chronic microvascular disease and moderate atrophy. 3. Unchanged right frontal calvarial lesion. Electronically Signed   By: Ulyses Jarred M.D.   On: 09/23/2015 22:21   Mr Brain Wo Contrast  Result Date: 09/24/2015 CLINICAL DATA:  Altered mental status. EXAM:  MRI HEAD WITHOUT CONTRAST TECHNIQUE: Multiplanar, multiecho pulse sequences of the brain and surrounding structures were obtained without intravenous contrast. COMPARISON:  Head CT 09/23/2015 and MRI 06/27/2013 FINDINGS: Brain: There is no evidence of acute infarct, intracranial hemorrhage, mass, midline shift, or extra-axial fluid collection. There is moderate enlargement of the lateral and third ventricles which has mildly progressed from the prior MRI. There is mild diffuse enlargement of the cerebral sulci and sylvian fissures. Periventricular white matter T2 hyperintensities are mildly advanced for age and have slightly progressed from 2015. Mild patchy T2 hyperintensities are also present in the pons. Vascular: Unchanged, hypoplastic  appearance of the distal left vertebral artery with either slow flow or occlusion. Other major intracranial vascular flow voids are preserved, including of the dominant right vertebral artery. Skull and upper cervical spine: Unchanged 2.4 cm T2 hyperintense right frontal skull lesion. Prior anterior cervical fusion. Sinuses/Orbits: Unremarkable. Other: None. IMPRESSION: 1. No acute intracranial abnormality. 2. Moderate ventriculomegaly, mildly progressed from 2015 and favored to reflect cerebral atrophy over hydrocephalus. 3. Mild cerebral white matter disease, slightly progressed from 2015 and nonspecific but compatible with mild chronic small vessel ischemic disease. Electronically Signed   By: Logan Bores M.D.   On: 09/24/2015 15:11   Dg Chest Port 1 View  Result Date: 09/24/2015 CLINICAL DATA:  Confusion EXAM: PORTABLE CHEST 1 VIEW COMPARISON:  10/01/2013 FINDINGS: Prior CABG. Cardiomegaly. Low lung volumes with bibasilar atelectasis. No effusions or acute bony abnormality. IMPRESSION: Cardiomegaly.  Bibasilar atelectasis. Electronically Signed   By: Rolm Baptise M.D.   On: 09/24/2015 08:58   Dg Abd Portable 1v  Result Date: 09/25/2015 CLINICAL DATA:  Constipation with abdominal pain and distention. EXAM: PORTABLE ABDOMEN - 1 VIEW COMPARISON:  CT scan 10/01/2013. FINDINGS: Portable supine abdomen at 1658 hours shows no gaseous small bowel dilatation suggest obstruction. Air seen scattered along any nondilated colon without a substantial stool volume evident. Degenerative changes noted lower lumbar spine. IMPRESSION: Nonspecific bowel gas pattern. Electronically Signed   By: Misty Stanley M.D.   On: 09/25/2015 17:09      TODAY-DAY OF DISCHARGE:  Subjective:   [name] today Is awake, mostly alert but pleasantly confused  Objective:   Blood pressure 139/79, pulse 61, temperature 97.6 F (36.4 C), temperature source Oral, resp. rate 17, height 6' (1.829 m), weight 86.4 kg (190 lb 7.6 oz),  SpO2 95 %.  Intake/Output Summary (Last 24 hours) at 09/29/15 1237 Last data filed at 09/29/15 0300  Gross per 24 hour  Intake             2130 ml  Output                0 ml  Net             2130 ml   Filed Weights   09/24/15 0102  Weight: 86.4 kg (190 lb 7.6 oz)    Exam: Awake  No new F.N deficits, Normal affect [place].AT,PERRAL Supple Neck,No JVD, No cervical lymphadenopathy appriciated.  Symmetrical Chest wall movement, Good air movement bilaterally, CTAB RRR,No Gallops,Rubs or new Murmurs, No Parasternal Heave +ve B.Sounds, Abd Soft, Non tender, No organomegaly appriciated, No rebound -guarding or rigidity. No Cyanosis, Clubbing or edema, No new Rash or bruise   PERTINENT RADIOLOGIC STUDIES: Ct Head Wo Contrast  Result Date: 09/23/2015 CLINICAL DATA:  Confusion.  History of carotid stenosis. EXAM: CT HEAD WITHOUT CONTRAST TECHNIQUE: Contiguous axial images were obtained from the base of the skull through the  vertex without intravenous contrast. COMPARISON:  Brain MRI 06/27/2013,Head CT 05/19/2010 FINDINGS: Brain: No mass lesion, intraparenchymal hemorrhage or extra-axial collection. No evidence of acute cortical infarct. There is confluent periventricular hypoattenuation compatible with chronic microvascular disease. Prominence of the ventricles and sulci suggest age-related atrophy. Vascular: There is atherosclerotic calcification of the internal carotid artery is. No hyperdense vessel sign. Skull: Lytic lesion within the right frontal calvarium is unchanged in size, measuring 1.9 by 0.9 cm. Skull is otherwise unremarkable. Sinuses/Orbits: No sinus fluid levels or advanced mucosal thickening. No mastoid effusion. Normal orbits. IMPRESSION: 1. No acute intracranial abnormality. 2. Findings of chronic microvascular disease and moderate atrophy. 3. Unchanged right frontal calvarial lesion. Electronically Signed   By: Ulyses Jarred M.D.   On: 09/23/2015 22:21   Mr Brain Wo  Contrast  Result Date: 09/24/2015 CLINICAL DATA:  Altered mental status. EXAM: MRI HEAD WITHOUT CONTRAST TECHNIQUE: Multiplanar, multiecho pulse sequences of the brain and surrounding structures were obtained without intravenous contrast. COMPARISON:  Head CT 09/23/2015 and MRI 06/27/2013 FINDINGS: Brain: There is no evidence of acute infarct, intracranial hemorrhage, mass, midline shift, or extra-axial fluid collection. There is moderate enlargement of the lateral and third ventricles which has mildly progressed from the prior MRI. There is mild diffuse enlargement of the cerebral sulci and sylvian fissures. Periventricular white matter T2 hyperintensities are mildly advanced for age and have slightly progressed from 2015. Mild patchy T2 hyperintensities are also present in the pons. Vascular: Unchanged, hypoplastic appearance of the distal left vertebral artery with either slow flow or occlusion. Other major intracranial vascular flow voids are preserved, including of the dominant right vertebral artery. Skull and upper cervical spine: Unchanged 2.4 cm T2 hyperintense right frontal skull lesion. Prior anterior cervical fusion. Sinuses/Orbits: Unremarkable. Other: None. IMPRESSION: 1. No acute intracranial abnormality. 2. Moderate ventriculomegaly, mildly progressed from 2015 and favored to reflect cerebral atrophy over hydrocephalus. 3. Mild cerebral white matter disease, slightly progressed from 2015 and nonspecific but compatible with mild chronic small vessel ischemic disease. Electronically Signed   By: Logan Bores M.D.   On: 09/24/2015 15:11   Dg Chest Port 1 View  Result Date: 09/24/2015 CLINICAL DATA:  Confusion EXAM: PORTABLE CHEST 1 VIEW COMPARISON:  10/01/2013 FINDINGS: Prior CABG. Cardiomegaly. Low lung volumes with bibasilar atelectasis. No effusions or acute bony abnormality. IMPRESSION: Cardiomegaly.  Bibasilar atelectasis. Electronically Signed   By: Rolm Baptise M.D.   On: 09/24/2015 08:58    Dg Abd Portable 1v  Result Date: 09/25/2015 CLINICAL DATA:  Constipation with abdominal pain and distention. EXAM: PORTABLE ABDOMEN - 1 VIEW COMPARISON:  CT scan 10/01/2013. FINDINGS: Portable supine abdomen at 1658 hours shows no gaseous small bowel dilatation suggest obstruction. Air seen scattered along any nondilated colon without a substantial stool volume evident. Degenerative changes noted lower lumbar spine. IMPRESSION: Nonspecific bowel gas pattern. Electronically Signed   By: Misty Stanley M.D.   On: 09/25/2015 17:09     PERTINENT LAB RESULTS: CBC: No results for input(s): WBC, HGB, HCT, PLT in the last 72 hours. CMET CMP     Component Value Date/Time   NA 140 09/28/2015 0956   K 3.6 09/28/2015 0956   CL 108 09/28/2015 0956   CO2 23 09/28/2015 0956   GLUCOSE 92 09/28/2015 0956   BUN 11 09/28/2015 0956   CREATININE 0.98 09/28/2015 0956   CALCIUM 8.6 (L) 09/28/2015 0956   PROT 6.0 (L) 09/23/2015 1950   ALBUMIN 3.4 (L) 09/23/2015 1950   AST 24 09/23/2015  1950   ALT 18 09/23/2015 1950   ALKPHOS 64 09/23/2015 1950   BILITOT 1.9 (H) 09/23/2015 1950   GFRNONAA >60 09/28/2015 0956   GFRAA >60 09/28/2015 0956    GFR Estimated Creatinine Clearance: 63.8 mL/min (by C-G formula based on SCr of 0.98 mg/dL). No results for input(s): LIPASE, AMYLASE in the last 72 hours. No results for input(s): CKTOTAL, CKMB, CKMBINDEX, TROPONINI in the last 72 hours. Invalid input(s): POCBNP No results for input(s): DDIMER in the last 72 hours. No results for input(s): HGBA1C in the last 72 hours. No results for input(s): CHOL, HDL, LDLCALC, TRIG, CHOLHDL, LDLDIRECT in the last 72 hours. No results for input(s): TSH, T4TOTAL, T3FREE, THYROIDAB in the last 72 hours.  Invalid input(s): FREET3 No results for input(s): VITAMINB12, FOLATE, FERRITIN, TIBC, IRON, RETICCTPCT in the last 72 hours. Coags: No results for input(s): INR in the last 72 hours.  Invalid input(s):  PT Microbiology: Recent Results (from the past 240 hour(s))  Urine culture     Status: Abnormal   Collection Time: 09/23/15  8:59 PM  Result Value Ref Range Status   Specimen Description URINE, RANDOM  Final   Special Requests NONE  Final   Culture 1,000 COLONIES/mL INSIGNIFICANT GROWTH (A)  Final   Report Status 09/25/2015 FINAL  Final    FURTHER DISCHARGE INSTRUCTIONS:  Get Medicines reviewed and adjusted: Please take all your medications with you for your next visit with your Primary MD  Laboratory/radiological data: Please request your Primary MD to go over all hospital tests and procedure/radiological results at the follow up, please ask your Primary MD to get all Hospital records sent to his/her office.  In some cases, they will be blood work, cultures and biopsy results pending at the time of your discharge. Please request that your primary care M.D. goes through all the records of your hospital data and follows up on these results.  Also Note the following: If you experience worsening of your admission symptoms, develop shortness of breath, life threatening emergency, suicidal or homicidal thoughts you must seek medical attention immediately by calling 911 or calling your MD immediately  if symptoms less severe.  You must read complete instructions/literature along with all the possible adverse reactions/side effects for all the Medicines you take and that have been prescribed to you. Take any new Medicines after you have completely understood and accpet all the possible adverse reactions/side effects.   Do not drive when taking Pain medications or sleeping medications (Benzodaizepines)  Do not take more than prescribed Pain, Sleep and Anxiety Medications. It is not advisable to combine anxiety,sleep and pain medications without talking with your primary care practitioner  Special Instructions: If you have smoked or chewed Tobacco  in the last 2 yrs please stop smoking, stop  any regular Alcohol  and or any Recreational drug use.  Wear Seat belts while driving.  Please note: You were cared for by a hospitalist during your hospital stay. Once you are discharged, your primary care physician will handle any further medical issues. Please note that NO REFILLS for any discharge medications will be authorized once you are discharged, as it is imperative that you return to your primary care physician (or establish a relationship with a primary care physician if you do not have one) for your post hospital discharge needs so that they can reassess your need for medications and monitor your lab values.  Total Time spent coordinating discharge including counseling, education and face to face time equals  45 minutes.  SignedOren Binet 09/29/2015 12:37 PM

## 2015-09-29 NOTE — Progress Notes (Signed)
Patient will DC to: [facility] Anticipated DC date: 09/29/15 Family notified: Tammy Transport by: Corey Harold   Per MD patient ready for DC to [facility]. RN, patient, patient's family, and facility notified of DC. Discharge Summary sent to facility. RN given number for report. DC packet on chart. Ambulance transport requested for patient.   CSW signing off.  Cedric Fishman, [place] Social Worker [phone]

## 2015-09-29 NOTE — Clinical Social Work Placement (Signed)
   CLINICAL SOCIAL WORK PLACEMENT  NOTE  Date:  09/29/2015  Patient Details  Name: [name] MRN: [id number] Date of Birth: 18-Apr-1931  Clinical Social Work is seeking post-discharge placement for this patient at the [place] level of care (*CSW will initial, date and re-position this form in  chart as items are completed):  Yes   Patient/family provided with [place] Work Department's list of facilities offering this level of care within the geographic area requested by the patient (or if unable, by the patient's family).  Yes   Patient/family informed of their freedom to choose among providers that offer the needed level of care, that participate in Medicare, Medicaid or managed care program needed by the patient, have an available bed and are willing to accept the patient.  Yes   Patient/family informed of [facility]'s ownership interest in [facility] and [facility], as well as of the fact that they are under no obligation to receive care at these facilities.  PASRR submitted to EDS on       PASRR number received on       Existing PASRR number confirmed on 09/27/15     FL2 transmitted to all facilities in geographic area requested by pt/family on 09/27/15     FL2 transmitted to all facilities within larger geographic area on       Patient informed that his/her managed care company has contracts with or will negotiate with certain facilities, including the following:        Yes   Patient/family informed of bed offers received.  Patient chooses bed at [facility]     Physician recommends and patient chooses bed at      Patient to be transferred to [facility] on 09/29/15.  Patient to be transferred to facility by [facility]     Patient family notified on 09/29/15 of transfer.  Name of family member notified:  Tammy     PHYSICIAN Please sign FL2, Please sign DNR     Additional Comment:     _______________________________________________ Benard Halsted, [place] 09/29/2015, 2:01 PM

## 2015-09-29 NOTE — Consult Note (Addendum)
   [facility] CM Inpatient Consult   09/29/2015  BURCE TOYE [birth year] [id number]   1545 (late entry) Referral received for [facility] at skilled facility. Alert inpatient RNCM that referral received to [facility] instead of [facility].  [facility] information forwarded to inpatient RNCM. Patient was in 5W05.  This Probation officer also spoke with Manus Gunning at [facility] to see if the program was available but there is currently not a contract with [facility] and [facility] follows patient's for home palliative care.  Natividad Brood, RN BSN [facility] Liaison  [phone] business mobile phone Toll free office [phone]

## 2015-09-29 NOTE — Care Management Important Message (Signed)
Important Message  Patient Details  Name: [name] MRN: [id number] Date of Birth: [birth year]   Medicare Important Message Given:  Yes    Nathen May 09/29/2015, 11:42 AM

## 2015-09-29 NOTE — Progress Notes (Signed)
Met with Mr. Enrique and his family. Less sedated today, but was awake most of the night and is now sleeping but fortunately today he is arousable but groggy.  1. Vascular Dementia Progression 2. Urinary Frequency/Urgency in setting of enlarged prostate with elevated PSA 3. Behavioral Disturbance, Sundowning, Agitation, under much better control 4. Chronic pain related to degenerative joint disease 5. 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  There is no clear or definitive reason for his acute change in mental status- I suspect it is related to intermittent urinary retention without infection.  1. DNR, ALLOW FOR A NATURAL DEATH TO OCCUR -in setting of advanced dementia they do not want any interventions that will prolong his life in his condition or lead to more loss of function. Only interventions geared towards improving his QOL.  2. Goal is for short term rehab (IF HE IS ABLE)-then home with 24/7 caregivers- possibly hospice or if not quite eligible palliative care-will depend on how he does at rehab.  3. I have added on Celebrex- he did excellent with this for pain control in the past- he is intolerant to almost all opiates-should continue scheduled tylenol- pain may be driving his behavior as well.  4. Started Avodart for his BPH symptoms, he is intolerant to Flomax.  5. I have further adjusted his doses of benzodiazepines and olanzapine.  I personally went over med reconciliation with this family.They are very engaged in his care.   Lane Hacker, DO Palliative Medicine [phone]  Time: 11-1230PM 90 minutes- prolonged time  Greater than 50%  of this time was spent counseling and coordinating care related to the above assessment and plan.

## 2015-09-30 ENCOUNTER — Ambulatory Visit: Payer: Medicare Other | Admitting: Family Medicine

## 2015-09-30 DIAGNOSIS — F0151 Vascular dementia with behavioral disturbance: Secondary | ICD-10-CM | POA: Diagnosis not present

## 2015-09-30 DIAGNOSIS — I10 Essential (primary) hypertension: Secondary | ICD-10-CM | POA: Diagnosis not present

## 2015-09-30 DIAGNOSIS — N183 Chronic kidney disease, stage 3 (moderate): Secondary | ICD-10-CM | POA: Diagnosis not present

## 2015-09-30 DIAGNOSIS — I2510 Atherosclerotic heart disease of native coronary artery without angina pectoris: Secondary | ICD-10-CM | POA: Diagnosis not present

## 2015-10-01 DIAGNOSIS — I2510 Atherosclerotic heart disease of native coronary artery without angina pectoris: Secondary | ICD-10-CM | POA: Diagnosis not present

## 2015-10-01 DIAGNOSIS — F0151 Vascular dementia with behavioral disturbance: Secondary | ICD-10-CM | POA: Diagnosis not present

## 2015-10-01 DIAGNOSIS — R410 Disorientation, unspecified: Secondary | ICD-10-CM | POA: Diagnosis not present

## 2015-10-01 DIAGNOSIS — I10 Essential (primary) hypertension: Secondary | ICD-10-CM | POA: Diagnosis not present

## 2015-10-13 DIAGNOSIS — N183 Chronic kidney disease, stage 3 (moderate): Secondary | ICD-10-CM | POA: Diagnosis not present

## 2015-10-13 DIAGNOSIS — F0151 Vascular dementia with behavioral disturbance: Secondary | ICD-10-CM | POA: Diagnosis not present

## 2015-10-13 DIAGNOSIS — R410 Disorientation, unspecified: Secondary | ICD-10-CM | POA: Diagnosis not present

## 2015-10-13 DIAGNOSIS — I2510 Atherosclerotic heart disease of native coronary artery without angina pectoris: Secondary | ICD-10-CM | POA: Diagnosis not present

## 2015-10-15 DIAGNOSIS — F29 Unspecified psychosis not due to a substance or known physiological condition: Secondary | ICD-10-CM | POA: Diagnosis not present

## 2015-10-20 DIAGNOSIS — R531 Weakness: Secondary | ICD-10-CM | POA: Diagnosis not present

## 2015-10-22 DIAGNOSIS — N183 Chronic kidney disease, stage 3 (moderate): Secondary | ICD-10-CM | POA: Diagnosis not present

## 2015-10-22 DIAGNOSIS — W19XXXD Unspecified fall, subsequent encounter: Secondary | ICD-10-CM | POA: Diagnosis not present

## 2015-10-22 DIAGNOSIS — F0151 Vascular dementia with behavioral disturbance: Secondary | ICD-10-CM | POA: Diagnosis not present

## 2015-10-22 DIAGNOSIS — I2510 Atherosclerotic heart disease of native coronary artery without angina pectoris: Secondary | ICD-10-CM | POA: Diagnosis not present

## 2015-10-25 ENCOUNTER — Telehealth: Payer: Self-pay | Admitting: Family Medicine

## 2015-10-25 NOTE — Telephone Encounter (Signed)
Pt is going to [facility] and needs order for hospital bed. Pt fax order to pt daughter attn vickie [phone]

## 2015-10-26 NOTE — Telephone Encounter (Signed)
Spoke with daughter who is requesting a hospital bed for her father's safety while he is at [facility]. He was recently at [facility] and had a hospital bed there. He currently has a regular twin size bed.

## 2015-10-28 DIAGNOSIS — N183 Chronic kidney disease, stage 3 (moderate): Secondary | ICD-10-CM | POA: Diagnosis not present

## 2015-10-28 DIAGNOSIS — I739 Peripheral vascular disease, unspecified: Secondary | ICD-10-CM | POA: Diagnosis not present

## 2015-10-28 DIAGNOSIS — F0390 Unspecified dementia without behavioral disturbance: Secondary | ICD-10-CM | POA: Diagnosis not present

## 2015-10-28 DIAGNOSIS — I7389 Other specified peripheral vascular diseases: Secondary | ICD-10-CM | POA: Diagnosis not present

## 2015-10-28 DIAGNOSIS — I129 Hypertensive chronic kidney disease with stage 1 through stage 4 chronic kidney disease, or unspecified chronic kidney disease: Secondary | ICD-10-CM | POA: Diagnosis not present

## 2015-10-28 DIAGNOSIS — Z9181 History of falling: Secondary | ICD-10-CM | POA: Diagnosis not present

## 2015-10-28 DIAGNOSIS — I2510 Atherosclerotic heart disease of native coronary artery without angina pectoris: Secondary | ICD-10-CM | POA: Diagnosis not present

## 2015-10-29 DIAGNOSIS — Z9181 History of falling: Secondary | ICD-10-CM | POA: Diagnosis not present

## 2015-10-29 DIAGNOSIS — I129 Hypertensive chronic kidney disease with stage 1 through stage 4 chronic kidney disease, or unspecified chronic kidney disease: Secondary | ICD-10-CM | POA: Diagnosis not present

## 2015-10-29 DIAGNOSIS — I739 Peripheral vascular disease, unspecified: Secondary | ICD-10-CM | POA: Diagnosis not present

## 2015-10-29 DIAGNOSIS — N183 Chronic kidney disease, stage 3 (moderate): Secondary | ICD-10-CM | POA: Diagnosis not present

## 2015-10-29 DIAGNOSIS — I2510 Atherosclerotic heart disease of native coronary artery without angina pectoris: Secondary | ICD-10-CM | POA: Diagnosis not present

## 2015-10-29 DIAGNOSIS — F0390 Unspecified dementia without behavioral disturbance: Secondary | ICD-10-CM | POA: Diagnosis not present

## 2015-11-02 DIAGNOSIS — Z9181 History of falling: Secondary | ICD-10-CM | POA: Diagnosis not present

## 2015-11-02 DIAGNOSIS — F0390 Unspecified dementia without behavioral disturbance: Secondary | ICD-10-CM | POA: Diagnosis not present

## 2015-11-02 DIAGNOSIS — I2510 Atherosclerotic heart disease of native coronary artery without angina pectoris: Secondary | ICD-10-CM | POA: Diagnosis not present

## 2015-11-02 DIAGNOSIS — I739 Peripheral vascular disease, unspecified: Secondary | ICD-10-CM | POA: Diagnosis not present

## 2015-11-02 DIAGNOSIS — I129 Hypertensive chronic kidney disease with stage 1 through stage 4 chronic kidney disease, or unspecified chronic kidney disease: Secondary | ICD-10-CM | POA: Diagnosis not present

## 2015-11-02 DIAGNOSIS — N183 Chronic kidney disease, stage 3 (moderate): Secondary | ICD-10-CM | POA: Diagnosis not present

## 2015-11-03 DIAGNOSIS — I2510 Atherosclerotic heart disease of native coronary artery without angina pectoris: Secondary | ICD-10-CM | POA: Diagnosis not present

## 2015-11-03 DIAGNOSIS — Z9181 History of falling: Secondary | ICD-10-CM | POA: Diagnosis not present

## 2015-11-03 DIAGNOSIS — I739 Peripheral vascular disease, unspecified: Secondary | ICD-10-CM | POA: Diagnosis not present

## 2015-11-03 DIAGNOSIS — F0390 Unspecified dementia without behavioral disturbance: Secondary | ICD-10-CM | POA: Diagnosis not present

## 2015-11-03 DIAGNOSIS — I129 Hypertensive chronic kidney disease with stage 1 through stage 4 chronic kidney disease, or unspecified chronic kidney disease: Secondary | ICD-10-CM | POA: Diagnosis not present

## 2015-11-03 DIAGNOSIS — N183 Chronic kidney disease, stage 3 (moderate): Secondary | ICD-10-CM | POA: Diagnosis not present

## 2015-11-04 DIAGNOSIS — F0390 Unspecified dementia without behavioral disturbance: Secondary | ICD-10-CM | POA: Diagnosis not present

## 2015-11-04 DIAGNOSIS — N183 Chronic kidney disease, stage 3 (moderate): Secondary | ICD-10-CM | POA: Diagnosis not present

## 2015-11-04 DIAGNOSIS — I129 Hypertensive chronic kidney disease with stage 1 through stage 4 chronic kidney disease, or unspecified chronic kidney disease: Secondary | ICD-10-CM | POA: Diagnosis not present

## 2015-11-04 DIAGNOSIS — I2510 Atherosclerotic heart disease of native coronary artery without angina pectoris: Secondary | ICD-10-CM | POA: Diagnosis not present

## 2015-11-04 DIAGNOSIS — I739 Peripheral vascular disease, unspecified: Secondary | ICD-10-CM | POA: Diagnosis not present

## 2015-11-04 DIAGNOSIS — Z9181 History of falling: Secondary | ICD-10-CM | POA: Diagnosis not present

## 2015-11-05 ENCOUNTER — Telehealth: Payer: Self-pay | Admitting: Family Medicine

## 2015-11-05 NOTE — Telephone Encounter (Signed)
Yes thanks, may fax- I thought he was already on palliative care though- didn't we fax this before?

## 2015-11-05 NOTE — Telephone Encounter (Signed)
Pt daughter would like an order for her dad to start palliative call fax to [facility] attn tomie jacubowitz [phone]. Pt daughter still want dr Retail banker on board as her dad PCP

## 2015-11-08 ENCOUNTER — Emergency Department (HOSPITAL_COMMUNITY): Payer: Medicare Other

## 2015-11-08 ENCOUNTER — Emergency Department (HOSPITAL_COMMUNITY)
Admission: EM | Admit: 2015-11-08 | Discharge: 2015-11-08 | Disposition: A | Discharge status: Home / Self Care | Payer: Medicare Other | Attending: Emergency Medicine | Admitting: Emergency Medicine

## 2015-11-08 ENCOUNTER — Encounter (HOSPITAL_COMMUNITY): Payer: Self-pay | Admitting: Emergency Medicine

## 2015-11-08 DIAGNOSIS — Z85818 Personal history of malignant neoplasm of other sites of lip, oral cavity, and pharynx: Secondary | ICD-10-CM | POA: Insufficient documentation

## 2015-11-08 DIAGNOSIS — M25511 Pain in right shoulder: Secondary | ICD-10-CM | POA: Insufficient documentation

## 2015-11-08 DIAGNOSIS — Y92129 Unspecified place in nursing home as the place of occurrence of the external cause: Secondary | ICD-10-CM | POA: Diagnosis not present

## 2015-11-08 DIAGNOSIS — S0001XA Abrasion of scalp, initial encounter: Secondary | ICD-10-CM | POA: Insufficient documentation

## 2015-11-08 DIAGNOSIS — I2510 Atherosclerotic heart disease of native coronary artery without angina pectoris: Secondary | ICD-10-CM | POA: Insufficient documentation

## 2015-11-08 DIAGNOSIS — Y939 Activity, unspecified: Secondary | ICD-10-CM | POA: Insufficient documentation

## 2015-11-08 DIAGNOSIS — N183 Chronic kidney disease, stage 3 (moderate): Secondary | ICD-10-CM | POA: Diagnosis not present

## 2015-11-08 DIAGNOSIS — Z7982 Long term (current) use of aspirin: Secondary | ICD-10-CM | POA: Insufficient documentation

## 2015-11-08 DIAGNOSIS — Z87891 Personal history of nicotine dependence: Secondary | ICD-10-CM | POA: Insufficient documentation

## 2015-11-08 DIAGNOSIS — Z951 Presence of aortocoronary bypass graft: Secondary | ICD-10-CM | POA: Diagnosis not present

## 2015-11-08 DIAGNOSIS — S0990XA Unspecified injury of head, initial encounter: Secondary | ICD-10-CM | POA: Diagnosis not present

## 2015-11-08 DIAGNOSIS — Y999 Unspecified external cause status: Secondary | ICD-10-CM | POA: Diagnosis not present

## 2015-11-08 DIAGNOSIS — Z79899 Other long term (current) drug therapy: Secondary | ICD-10-CM | POA: Diagnosis not present

## 2015-11-08 DIAGNOSIS — I129 Hypertensive chronic kidney disease with stage 1 through stage 4 chronic kidney disease, or unspecified chronic kidney disease: Secondary | ICD-10-CM | POA: Diagnosis not present

## 2015-11-08 DIAGNOSIS — W19XXXA Unspecified fall, initial encounter: Secondary | ICD-10-CM | POA: Insufficient documentation

## 2015-11-08 DIAGNOSIS — S199XXA Unspecified injury of neck, initial encounter: Secondary | ICD-10-CM | POA: Diagnosis not present

## 2015-11-08 DIAGNOSIS — M25519 Pain in unspecified shoulder: Secondary | ICD-10-CM | POA: Diagnosis not present

## 2015-11-08 DIAGNOSIS — R259 Unspecified abnormal involuntary movements: Secondary | ICD-10-CM | POA: Diagnosis not present

## 2015-11-08 NOTE — ED Provider Notes (Signed)
[facility] DEPT Provider Note   CSN: [id number] Arrival date & time: 11/08/15  1035     History   Chief Complaint Chief Complaint  Patient presents with  . Fall    HPI [name] is a 82 y.o. male.  82 year old male with past medical history below including dementia who presents with fall. Just prior to arrival, the patient was at his nursing facility they found him on the floor, presumably from a fall. According to daughter he has complained of some right shoulder pain but has not complained of any other pain. He is up-to-date on tetanus vaccination. No anticoagulant use. He is at his neurologic baseline.  LEVEL 5 CAVEAT DUE TO AMS   The history is provided by a relative and the nursing home.    Past Medical History:  Diagnosis Date  . Abdominal aortic aneurysm ([place])    a. 07/2014 Abd U/S: 3.61 cm, RCIA >50d.  Marland Kitchen Aphthous ulcer   . BPH (benign prostatic hypertrophy)   . CAD (coronary artery disease)    a. s/p CABG 1993;  b. cath 10/10: severe native 3v CAD, S-Dx ok, S-OM1/OM2 ok, S-PDA ok, L-LAD ok, EF 60%.  . Carotid stenosis    a. s/p Left CEA, followed by Dr. Donnetta Hutching;  b. 07/2014 u/s: RICA < 20, LICA patent.  . Cervical spondylosis without myelopathy   . CKD (chronic kidney disease), stage III   . GERD (gastroesophageal reflux disease)   . History of nephrolithiasis   . HOH (hard of hearing)    hearing aids  . Hyperlipidemia   . Memory deficits 06/19/2013  . Pre-syncope   . Radicular low back pain   . Throat cancer ([place])   . 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 Unspecified adverse effect of unspecified drug, medicinal and biological substance     Patient Active Problem List   Diagnosis Date Noted  . UTI (lower urinary tract infection) 09/23/2015  . Acute encephalopathy 09/23/2015  . Depression 10/24/2013  . Postoperative hematoma 10/01/2013  . CKD (chronic kidney disease), stage III 09/28/2013  . Right inguinal hernia 09/26/2013  . AAA  (abdominal aortic aneurysm) without rupture ([place]) 07/22/2013  . Cervical spondylosis without myelopathy 06/19/2013  . Memory deficits 06/19/2013  . PVD- hx CEA,patent carotids by doppler July 2015 09/26/2010  . 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 Essential hypertension 03/16/2009  . Low back pain potentially associated with radiculopathy 03/19/2008  . BPH (benign prostatic hyperplasia) 11/25/2007  . GERD 10/24/2006  . Hyperlipidemia 09/14/2006  . CAD (coronary artery disease) s/p bypass graft 09/14/2006  . NEPHROLITHIASIS, HX OF 09/14/2006    Past Surgical History:  Procedure Laterality Date  . BACK SURGERY    . CARDIAC CATHETERIZATION    . CAROTID ENDARTERECTOMY Left   . CHOLECYSTECTOMY    . CORONARY ARTERY BYPASS GRAFT    . CORONARY ARTERY BYPASS GRAFT  1998  . HERNIA REPAIR     unclear which side  . INGUINAL HERNIA REPAIR Right 09/29/2013   Procedure: HERNIA REPAIR INGUINAL ADULT;  Surgeon: Rolm Bookbinder, MD;  Location: [facility];  Service: General;  Laterality: Right;  . INSERTION OF MESH Right 09/29/2013   Procedure: INSERTION OF MESH;  Surgeon: Rolm Bookbinder, MD;  Location: [facility];  Service: General;  Laterality: Right;  . PROSTATE SURGERY    . renal stone surgery     3 times  . THROAT SURGERY         Home Medications  Prior to Admission medications   Medication Sig Start Date End Date Taking? Authorizing Provider  acetaminophen (TYLENOL) 500 MG tablet Take 2 tablets (1,000 mg total) by mouth every 8 (eight) hours. 09/29/15  Yes Acquanetta Chain, DO  ALPHAGAN P 0.1 % SOLN Place 1 drop into both eyes at bedtime.  04/24/13  Yes Historical Provider, MD  aspirin 81 MG tablet Take 1 tablet (81 mg total) by mouth daily. Pt takes 1 tablet by mouth on Mon and Fri. 09/29/15  Yes Acquanetta Chain, DO  celecoxib (CELEBREX) 100 MG capsule Take 1 capsule (100 mg total) by mouth 2 (two) times daily. 09/29/15  Yes Acquanetta Chain, DO  dutasteride (AVODART) 0.5 MG  capsule Take 1 capsule (0.5 mg total) by mouth daily. 09/29/15  Yes Acquanetta Chain, DO  escitalopram (LEXAPRO) 20 MG tablet TAKE ONE TABLET BY MOUTH ONCE DAILY Patient taking differently: TAKE ONE TABLET (20 mg) BY MOUTH ONCE DAILY 08/30/15  Yes Marin Olp, MD  levothyroxine (SYNTHROID, LEVOTHROID) 50 MCG tablet Take 50 mcg by mouth daily before breakfast.   Yes Historical Provider, MD  LORazepam (ATIVAN) 1 MG tablet Take 1 tablet (1 mg total) by mouth every 6 (six) hours as needed (agitation, sundowning, may be given in addition to scheduled dose). 09/29/15  Yes Shanker Kristeen Mans, MD  OLANZapine (ZYPREXA) 5 MG tablet Take 1 tablet (5 mg total) by mouth at bedtime. 09/29/15  Yes Acquanetta Chain, DO  polyethylene glycol (MIRALAX / GLYCOLAX) packet Take 17 g by mouth 2 (two) times daily. 09/29/15  Yes Acquanetta Chain, DO  Vitamin D, Ergocalciferol, (DRISDOL) 50000 units CAPS capsule Take 50,000 Units by mouth See admin instructions. Patient takes Tuesday and Saturday   Yes Historical Provider, MD  LORazepam (ATIVAN) 1 MG tablet Take 1 tablet (1 mg total) by mouth at bedtime. Patient not taking: Reported on 11/08/2015 09/29/15   Jonetta Osgood, MD    Family History Family History  Problem Relation Age of Onset  . Heart disease Father   . Stroke Father   . AAA (abdominal aortic aneurysm) Sister   . AAA (abdominal aortic aneurysm) Brother   . Stroke Brother   . Coronary artery disease Brother   . Heart disease Brother   . Coronary artery disease      Social History Social History  Substance Use Topics  . Smoking status: Former Smoker    Packs/day: 1.00    Years: 30.00    Types: Cigarettes    Quit date: 01/17/1972  . Smokeless tobacco: Current User    Types: Chew  . Alcohol use No     Allergies   Avinza [morphine sulfate]; Doxycycline calcium; Vibramycin [doxycycline hyclate]; Flomax [tamsulosin hcl]; Statins; Codeine sulfate; and Oxycodone   Review of  Systems Review of Systems  Unable to perform ROS: Dementia     Physical Exam Updated Vital Signs BP 138/80   Pulse 61   Temp 97.5 F (36.4 C) (Oral)   Resp 14   SpO2 94%   Physical Exam  Constitutional: He appears well-developed and well-nourished. No distress.  Frail elderly man awake and alert  HENT:  Head: Normocephalic.  Abrasion top of scalp Moist mucous membranes  Eyes: Conjunctivae are normal. Pupils are equal, round, and reactive to light.  Neck: Normal range of motion. Neck supple.  Cardiovascular: Normal rate, regular rhythm and normal heart sounds.   No murmur heard. Pulmonary/Chest: Effort normal and breath sounds normal. He exhibits no  tenderness.  Abdominal: Soft. Bowel sounds are normal. He exhibits no distension. There is no tenderness.  Musculoskeletal: He exhibits no edema or deformity.  No obvious shoulder deformity  Neurological: He is alert.  Moving all 4 extremities  Skin: Skin is warm and dry.  Psychiatric: He has a normal mood and affect. Judgment normal.  Nursing note and vitals reviewed.    ED Treatments / Results  Labs (all labs ordered are listed, but only abnormal results are displayed) Labs Reviewed - No data to display  EKG  EKG Interpretation  Date/Time:  Monday November 08 2015 11:41:31 EDT Ventricular Rate:  63 PR Interval:    QRS Duration: 94 QT Interval:  432 QTC Calculation: 443 R Axis:   -3 Text Interpretation:  Sinus rhythm Supraventricular bigeminy Low voltage, precordial leads Borderline T abnormalities, diffuse leads No significant change since last tracing Confirmed by Anastasiya Gowin MD, Yvonne Petite [phone]) on 11/08/2015 11:45:30 AM       Radiology Dg Shoulder Right  Result Date: 11/08/2015 CLINICAL DATA:  Unwitnessed fall this morning. Right shoulder pain. Initial encounter. EXAM: RIGHT SHOULDER - 2+ VIEW COMPARISON:  Right shoulder MRI 04/15/2008 FINDINGS: The patient was unable to tolerate positioning for an axillary view.  There is no evidence of acute fracture or dislocation. Moderate AC joint arthrosis is noted. No focal osseous lesion or soft tissue abnormality is seen. IMPRESSION: 1. No evidence of acute osseous abnormality. 2. AC joint osteoarthrosis. Electronically Signed   By: Logan Bores M.D.   On: 11/08/2015 15:01   Ct Head Wo Contrast  Result Date: 11/08/2015 CLINICAL DATA:  Unwitnessed fall with head injury. Initial encounter. EXAM: CT HEAD WITHOUT CONTRAST CT CERVICAL SPINE WITHOUT CONTRAST TECHNIQUE: Multidetector CT imaging of the head and cervical spine was performed following the standard protocol without intravenous contrast. Multiplanar CT image reconstructions of the cervical spine were also generated. COMPARISON:  Head CT 09/23/2015 and MRI 09/24/2015. Cervical spine MRI 04/08/2013. Cervical spine CT myelogram 05/19/2010. FINDINGS: CT HEAD FINDINGS Brain: Moderate enlargement of the lateral and third ventricles is unchanged and favored to reflect central predominant cerebral atrophy. There is no evidence of acute cortical infarct, intracranial hemorrhage, mass, midline shift, or extra-axial fluid collection. Periventricular white matter hypodensities are unchanged and compatible with mild chronic small vessel ischemic disease. Vascular: Calcified atherosclerosis at the skullbase. Skull: Chronic 2.4 cm right frontal skull lesion, unchanged. No acute fracture identified. Right greater than left TMJ arthropathy. Sinuses/Orbits: Unremarkable orbits. Visualized paranasal sinuses and mastoid air cells are clear. Other: None. CT CERVICAL SPINE FINDINGS Alignment: Minimal facet mediated anterolisthesis of C2 on C3, unchanged. Skull base and vertebrae: C4-C7 ACDF with evidence of solid osseous fusion at each level. Bilateral facet ankylosis at C4-5. No acute fracture identified. Soft tissues and spinal canal: No prevertebral fluid or swelling. No visible canal hematoma. Disc levels: Moderate disc space narrowing and  spurring at C3-4 and C7-T1. Broad-based posterior disc osteophyte complex at C3-4 results in mild-to-moderate spinal stenosis, similar to the prior MRI. Multilevel neural foraminal stenosis, moderate to severe on the right at C3-4 and bilaterally at C6-7. Moderate facet arthrosis bilaterally at C2-3 and C3-4 and on the right at C7-T1. Upper chest: Unremarkable. Other: Surgical clips in the anterior left neck. 3 mm calculus in the right submandibular gland without evidence of acute inflammation. IMPRESSION: 1. No evidence of acute intracranial abnormality. 2. Moderate cerebral atrophy and mild chronic small vessel ischemic disease. 3. No evidence of acute cervical spine injury. 4. C4-C7 ACDF.  Moderate disc and facet degeneration. Electronically Signed   By: Logan Bores M.D.   On: 11/08/2015 13:11   Ct Cervical Spine Wo Contrast  Result Date: 11/08/2015 CLINICAL DATA:  Unwitnessed fall with head injury. Initial encounter. EXAM: CT HEAD WITHOUT CONTRAST CT CERVICAL SPINE WITHOUT CONTRAST TECHNIQUE: Multidetector CT imaging of the head and cervical spine was performed following the standard protocol without intravenous contrast. Multiplanar CT image reconstructions of the cervical spine were also generated. COMPARISON:  Head CT 09/23/2015 and MRI 09/24/2015. Cervical spine MRI 04/08/2013. Cervical spine CT myelogram 05/19/2010. FINDINGS: CT HEAD FINDINGS Brain: Moderate enlargement of the lateral and third ventricles is unchanged and favored to reflect central predominant cerebral atrophy. There is no evidence of acute cortical infarct, intracranial hemorrhage, mass, midline shift, or extra-axial fluid collection. Periventricular white matter hypodensities are unchanged and compatible with mild chronic small vessel ischemic disease. Vascular: Calcified atherosclerosis at the skullbase. Skull: Chronic 2.4 cm right frontal skull lesion, unchanged. No acute fracture identified. Right greater than left TMJ  arthropathy. Sinuses/Orbits: Unremarkable orbits. Visualized paranasal sinuses and mastoid air cells are clear. Other: None. CT CERVICAL SPINE FINDINGS Alignment: Minimal facet mediated anterolisthesis of C2 on C3, unchanged. Skull base and vertebrae: C4-C7 ACDF with evidence of solid osseous fusion at each level. Bilateral facet ankylosis at C4-5. No acute fracture identified. Soft tissues and spinal canal: No prevertebral fluid or swelling. No visible canal hematoma. Disc levels: Moderate disc space narrowing and spurring at C3-4 and C7-T1. Broad-based posterior disc osteophyte complex at C3-4 results in mild-to-moderate spinal stenosis, similar to the prior MRI. Multilevel neural foraminal stenosis, moderate to severe on the right at C3-4 and bilaterally at C6-7. Moderate facet arthrosis bilaterally at C2-3 and C3-4 and on the right at C7-T1. Upper chest: Unremarkable. Other: Surgical clips in the anterior left neck. 3 mm calculus in the right submandibular gland without evidence of acute inflammation. IMPRESSION: 1. No evidence of acute intracranial abnormality. 2. Moderate cerebral atrophy and mild chronic small vessel ischemic disease. 3. No evidence of acute cervical spine injury. 4. C4-C7 ACDF.  Moderate disc and facet degeneration. Electronically Signed   By: Logan Bores M.D.   On: 11/08/2015 13:11    Procedures Procedures (including critical care time)  Medications Ordered in ED Medications - No data to display   Initial Impression / Assessment and Plan / ED Course  I have reviewed the triage vital signs and the nursing notes.  Pertinent  imaging results that were available during my care of the patient were reviewed by me and considered in my medical decision making (see chart for details).  Clinical Course    Pt with dementia presenting after unwitnessed fall, awake and at neurologic baseline with reassuring vital signs. Abrasion to scalp noted. He has mentioned shoulder pain, no  obvious deformity. CT of head and C-spine negative for acute injury. Plain film of shoulder negative acute. EKG without acute changes. Patient has been at his baseline mental status here. Discussed supportive care for her wound and reviewed return precautions including any neurologic symptoms. Family voiced understanding and patient discharged in satisfactory condition.  Final Clinical Impressions(s) / ED Diagnoses   Final diagnoses:  Abrasion of scalp, initial encounter  Fall, initial encounter  Acute pain of right shoulder    New Prescriptions New Prescriptions   No medications on file     Sharlett Iles, MD 11/08/15 [phone]

## 2015-11-08 NOTE — ED Notes (Signed)
Pt and family made aware of discharge instructions. Family stated understanding of reasons for return and follow up care.

## 2015-11-08 NOTE — Discharge Instructions (Signed)
Please seek immediate medical attention if the patient has any change in his baseline of behavior/mental status, lethargy, vomiting, or extremity weakness. Keep scalp wound clean and dry, apply neosporin/bacitracin once daily and watch for signs of infection such as redness, warmth, or drainage. His shoulder x-ray was negative but if he continues to complain of pain, follow-up with primary care provider in one week.

## 2015-11-08 NOTE — ED Notes (Signed)
Pt returned from CT °

## 2015-11-08 NOTE — ED Triage Notes (Signed)
Pt comes from [facility] unit via [facility] with a fall. Pt fell approx 9am this morning. Pt has abrasion to right front of head and c/o right shoulder pain. No obvious deformities noted.

## 2015-11-08 NOTE — ED Notes (Signed)
Applied bacitracin to head abrasion per physician request.

## 2015-11-09 ENCOUNTER — Other Ambulatory Visit: Payer: Self-pay

## 2015-11-09 DIAGNOSIS — N183 Chronic kidney disease, stage 3 (moderate): Secondary | ICD-10-CM | POA: Diagnosis not present

## 2015-11-09 DIAGNOSIS — I739 Peripheral vascular disease, unspecified: Secondary | ICD-10-CM | POA: Diagnosis not present

## 2015-11-09 DIAGNOSIS — Z9181 History of falling: Secondary | ICD-10-CM | POA: Diagnosis not present

## 2015-11-09 DIAGNOSIS — I129 Hypertensive chronic kidney disease with stage 1 through stage 4 chronic kidney disease, or unspecified chronic kidney disease: Secondary | ICD-10-CM | POA: Diagnosis not present

## 2015-11-09 DIAGNOSIS — F0390 Unspecified dementia without behavioral disturbance: Secondary | ICD-10-CM | POA: Diagnosis not present

## 2015-11-09 DIAGNOSIS — R413 Other amnesia: Secondary | ICD-10-CM

## 2015-11-09 DIAGNOSIS — I2510 Atherosclerotic heart disease of native coronary artery without angina pectoris: Secondary | ICD-10-CM | POA: Diagnosis not present

## 2015-11-09 NOTE — Telephone Encounter (Signed)
Thanks for entering referral Jamie_ I have not received ativan request.

## 2015-11-09 NOTE — Telephone Encounter (Signed)
Pts daughter Alfie Everts) calling to see if the order has been sent to palliative care for a hospital bed for pt due to the pt has falling again and hit his head.

## 2015-11-09 NOTE — Telephone Encounter (Signed)
This order was faxed to daughter as per the instructions received on 10/25/2015.  [facility] and needs order for hospital bed. Pt fax order to pt daughter Melody Comas [phone]  Fax was sent to daughter Loletha Carrow on 10/26/15. Fax confirmation was received.  I will call [name] and confirm she received.

## 2015-11-09 NOTE — Telephone Encounter (Signed)
When I spoke to [name] she stated she was calling Dr. Hilma Favors to discuss the Avodart since that's who prescribed it. She did ask for a Palliative order and that Palliative would take care of the hospital bed. She also ask that the Ativan to be increased to BID. She stated [facility] told her they would fax over the request. I am entering a Palliative order.

## 2015-11-09 NOTE — Telephone Encounter (Signed)
I spoke with [name] about the RX that was faxed and she explained to me that you were going to get with [facility] about setting up care and they would order the bed.  She also mentioned that pt was placed on Avodart back in September while in the hospital but now insurance does not want to pay when they tried to refill it. Pt has one pill left for tomorrow and medication is helping him not use the restroom every 5- 10 minutes. Maybe we can shoot Clarise Cruz a message to started a PA (?). He has tried and failed Flomax with s/e of dizziness. If Avodart is not an option even after PA they said Finasteride would work if it did react to his other medications. Please review and advise [name]. Thanks.

## 2015-11-10 NOTE — Telephone Encounter (Signed)
Yvette with [facility] received a referral for the pt and would like to know if it is for Hospice or Palliative.

## 2015-11-10 NOTE — Telephone Encounter (Signed)
Waiting for request.

## 2015-11-11 ENCOUNTER — Other Ambulatory Visit: Payer: Self-pay | Admitting: Internal Medicine

## 2015-11-11 DIAGNOSIS — I129 Hypertensive chronic kidney disease with stage 1 through stage 4 chronic kidney disease, or unspecified chronic kidney disease: Secondary | ICD-10-CM | POA: Diagnosis not present

## 2015-11-11 DIAGNOSIS — Z9181 History of falling: Secondary | ICD-10-CM | POA: Diagnosis not present

## 2015-11-11 DIAGNOSIS — I2510 Atherosclerotic heart disease of native coronary artery without angina pectoris: Secondary | ICD-10-CM | POA: Diagnosis not present

## 2015-11-11 DIAGNOSIS — N183 Chronic kidney disease, stage 3 (moderate): Secondary | ICD-10-CM | POA: Diagnosis not present

## 2015-11-11 DIAGNOSIS — R296 Repeated falls: Secondary | ICD-10-CM

## 2015-11-11 DIAGNOSIS — R451 Restlessness and agitation: Secondary | ICD-10-CM | POA: Diagnosis not present

## 2015-11-11 DIAGNOSIS — I739 Peripheral vascular disease, unspecified: Secondary | ICD-10-CM | POA: Diagnosis not present

## 2015-11-11 DIAGNOSIS — F0390 Unspecified dementia without behavioral disturbance: Secondary | ICD-10-CM | POA: Diagnosis not present

## 2015-11-12 ENCOUNTER — Other Ambulatory Visit: Payer: Self-pay | Admitting: Family Medicine

## 2015-11-12 ENCOUNTER — Telehealth: Payer: Self-pay

## 2015-11-12 NOTE — Telephone Encounter (Signed)
Medication Refill Request Lorazepam 0.5mg  tablet Last RF 09-29-2015 #10 Last OV 09-13-2015

## 2015-11-12 NOTE — Telephone Encounter (Signed)
Prescription was sent to the pharmacy

## 2015-11-12 NOTE — Telephone Encounter (Signed)
Yes thanks, #60 with 5 refills

## 2015-11-15 ENCOUNTER — Emergency Department (HOSPITAL_COMMUNITY)
Admission: EM | Admit: 2015-11-15 | Discharge: 2015-11-16 | Payer: Medicare Other | Attending: Emergency Medicine | Admitting: Emergency Medicine

## 2015-11-15 ENCOUNTER — Encounter (HOSPITAL_COMMUNITY): Payer: Self-pay | Admitting: Emergency Medicine

## 2015-11-15 ENCOUNTER — Emergency Department (HOSPITAL_COMMUNITY): Payer: Medicare Other

## 2015-11-15 DIAGNOSIS — R4689 Other symptoms and signs involving appearance and behavior: Secondary | ICD-10-CM

## 2015-11-15 DIAGNOSIS — Z955 Presence of coronary angioplasty implant and graft: Secondary | ICD-10-CM | POA: Insufficient documentation

## 2015-11-15 DIAGNOSIS — R4182 Altered mental status, unspecified: Secondary | ICD-10-CM | POA: Diagnosis not present

## 2015-11-15 DIAGNOSIS — F17220 Nicotine dependence, chewing tobacco, uncomplicated: Secondary | ICD-10-CM | POA: Diagnosis not present

## 2015-11-15 DIAGNOSIS — F0390 Unspecified dementia without behavioral disturbance: Secondary | ICD-10-CM | POA: Insufficient documentation

## 2015-11-15 DIAGNOSIS — Z515 Encounter for palliative care: Secondary | ICD-10-CM | POA: Diagnosis not present

## 2015-11-15 DIAGNOSIS — Z8249 Family history of ischemic heart disease and other diseases of the circulatory system: Secondary | ICD-10-CM | POA: Diagnosis not present

## 2015-11-15 DIAGNOSIS — I129 Hypertensive chronic kidney disease with stage 1 through stage 4 chronic kidney disease, or unspecified chronic kidney disease: Secondary | ICD-10-CM | POA: Insufficient documentation

## 2015-11-15 DIAGNOSIS — Z7982 Long term (current) use of aspirin: Secondary | ICD-10-CM | POA: Insufficient documentation

## 2015-11-15 DIAGNOSIS — N183 Chronic kidney disease, stage 3 (moderate): Secondary | ICD-10-CM | POA: Diagnosis not present

## 2015-11-15 DIAGNOSIS — F918 Other conduct disorders: Secondary | ICD-10-CM | POA: Insufficient documentation

## 2015-11-15 DIAGNOSIS — Z79899 Other long term (current) drug therapy: Secondary | ICD-10-CM | POA: Insufficient documentation

## 2015-11-15 DIAGNOSIS — F29 Unspecified psychosis not due to a substance or known physiological condition: Secondary | ICD-10-CM | POA: Diagnosis not present

## 2015-11-15 DIAGNOSIS — I714 Abdominal aortic aneurysm, without rupture: Secondary | ICD-10-CM | POA: Diagnosis not present

## 2015-11-15 DIAGNOSIS — Z9889 Other specified postprocedural states: Secondary | ICD-10-CM | POA: Diagnosis not present

## 2015-11-15 DIAGNOSIS — I2510 Atherosclerotic heart disease of native coronary artery without angina pectoris: Secondary | ICD-10-CM | POA: Insufficient documentation

## 2015-11-15 DIAGNOSIS — F01518 Vascular dementia, unspecified severity, with other behavioral disturbance: Secondary | ICD-10-CM | POA: Diagnosis present

## 2015-11-15 DIAGNOSIS — Z85819 Personal history of malignant neoplasm of unspecified site of lip, oral cavity, and pharynx: Secondary | ICD-10-CM | POA: Insufficient documentation

## 2015-11-15 DIAGNOSIS — F0391 Unspecified dementia with behavioral disturbance: Secondary | ICD-10-CM | POA: Diagnosis not present

## 2015-11-15 DIAGNOSIS — F0151 Vascular dementia with behavioral disturbance: Secondary | ICD-10-CM | POA: Diagnosis not present

## 2015-11-15 LAB — CBC WITH DIFFERENTIAL/PLATELET
Basophils Absolute: 0 10*3/uL (ref 0.0–0.1)
Basophils Relative: 0 %
EOS ABS: 0.3 10*3/uL (ref 0.0–0.7)
Eosinophils Relative: 5 %
HEMATOCRIT: 42.9 % (ref 39.0–52.0)
HEMOGLOBIN: 14 g/dL (ref 13.0–17.0)
LYMPHS ABS: 1.3 10*3/uL (ref 0.7–4.0)
LYMPHS PCT: 23 %
MCH: 31.4 pg (ref 26.0–34.0)
MCHC: 32.6 g/dL (ref 30.0–36.0)
MCV: 96.2 fL (ref 78.0–100.0)
MONOS PCT: 10 %
Monocytes Absolute: 0.6 10*3/uL (ref 0.1–1.0)
NEUTROS PCT: 62 %
Neutro Abs: 3.5 10*3/uL (ref 1.7–7.7)
Platelets: 194 10*3/uL (ref 150–400)
RBC: 4.46 MIL/uL (ref 4.22–5.81)
RDW: 13.8 % (ref 11.5–15.5)
WBC: 5.7 10*3/uL (ref 4.0–10.5)

## 2015-11-15 LAB — COMPREHENSIVE METABOLIC PANEL
ALK PHOS: 71 U/L (ref 38–126)
ALT: 16 U/L — AB (ref 17–63)
ANION GAP: 7 (ref 5–15)
AST: 21 U/L (ref 15–41)
Albumin: 4 g/dL (ref 3.5–5.0)
BILIRUBIN TOTAL: 1.8 mg/dL — AB (ref 0.3–1.2)
BUN: 16 mg/dL (ref 6–20)
CALCIUM: 8.9 mg/dL (ref 8.9–10.3)
CO2: 23 mmol/L (ref 22–32)
CREATININE: 1.14 mg/dL (ref 0.61–1.24)
Chloride: 113 mmol/L — ABNORMAL HIGH (ref 101–111)
GFR, EST NON AFRICAN AMERICAN: 58 mL/min — AB (ref >60)
Glucose, Bld: 80 mg/dL (ref 65–99)
Potassium: 3.3 mmol/L — ABNORMAL LOW (ref 3.5–5.1)
SODIUM: 143 mmol/L (ref 135–145)
TOTAL PROTEIN: 6.5 g/dL (ref 6.5–8.1)

## 2015-11-15 LAB — RAPID URINE DRUG SCREEN, HOSP PERFORMED
Amphetamines: NOT DETECTED
BARBITURATES: NOT DETECTED
BENZODIAZEPINES: POSITIVE — AB
COCAINE: NOT DETECTED
Opiates: NOT DETECTED
Tetrahydrocannabinol: NOT DETECTED

## 2015-11-15 LAB — URINALYSIS, ROUTINE W REFLEX MICROSCOPIC
Glucose, UA: NEGATIVE mg/dL
KETONES UR: NEGATIVE mg/dL
NITRITE: NEGATIVE
PH: 5.5 (ref 5.0–8.0)
PROTEIN: NEGATIVE mg/dL
Specific Gravity, Urine: 1.024 (ref 1.005–1.030)

## 2015-11-15 LAB — URINE MICROSCOPIC-ADD ON

## 2015-11-15 LAB — ETHANOL: Alcohol, Ethyl (B): <5 mg/dL (ref <5)

## 2015-11-15 LAB — TSH: TSH: 1.908 u[IU]/mL (ref 0.350–4.500)

## 2015-11-15 MED ORDER — OLANZAPINE 5 MG PO TABS
5.0000 mg | ORAL_TABLET | Freq: Every day | ORAL | Status: DC
  Administered 2015-11-15: 5 mg via ORAL
  Filled 2015-11-15: qty 1

## 2015-11-15 MED ORDER — SENNA 8.6 MG PO TABS: 1.0000 | ORAL_TABLET | Freq: Every day | ORAL | Status: DC

## 2015-11-15 MED ORDER — POTASSIUM CHLORIDE CRYS ER 20 MEQ PO TBCR
40.0000 meq | EXTENDED_RELEASE_TABLET | Freq: Once | ORAL | Status: AC
  Administered 2015-11-15: 40 meq via ORAL
  Filled 2015-11-15: qty 2

## 2015-11-15 MED ORDER — CELECOXIB 100 MG PO CAPS
100.0000 mg | ORAL_CAPSULE | Freq: Two times a day (BID) | ORAL | Status: DC
  Administered 2015-11-15 – 2015-11-16 (×3): 100 mg via ORAL
  Filled 2015-11-15 (×4): qty 1

## 2015-11-15 MED ORDER — LORAZEPAM 1 MG PO TABS: 1.0000 mg | ORAL_TABLET | Freq: Every day | ORAL | Status: DC

## 2015-11-15 MED ORDER — ACETAMINOPHEN 325 MG PO TABS: 650.0000 mg | ORAL_TABLET | ORAL | Status: DC | PRN

## 2015-11-15 MED ORDER — ESCITALOPRAM OXALATE 10 MG PO TABS
20.0000 mg | ORAL_TABLET | Freq: Every day | ORAL | Status: DC
  Administered 2015-11-15: 10 mg via ORAL
  Filled 2015-11-15: qty 2

## 2015-11-15 MED ORDER — CITALOPRAM HYDROBROMIDE 10 MG PO TABS
10.0000 mg | ORAL_TABLET | Freq: Every day | ORAL | Status: DC
  Administered 2015-11-16: 10 mg via ORAL
  Filled 2015-11-15: qty 1

## 2015-11-15 MED ORDER — ONDANSETRON HCL 4 MG PO TABS: 4.0000 mg | ORAL_TABLET | Freq: Three times a day (TID) | ORAL | Status: DC | PRN

## 2015-11-15 MED ORDER — LORAZEPAM 0.5 MG PO TABS
0.5000 mg | ORAL_TABLET | Freq: Two times a day (BID) | ORAL | Status: DC
  Administered 2015-11-15 (×2): 0.5 mg via ORAL
  Filled 2015-11-15 (×2): qty 1

## 2015-11-15 MED ORDER — LEVOTHYROXINE SODIUM 50 MCG PO TABS
50.0000 ug | ORAL_TABLET | Freq: Every day | ORAL | Status: DC
  Administered 2015-11-15 – 2015-11-16 (×2): 50 ug via ORAL
  Filled 2015-11-15 (×3): qty 1

## 2015-11-15 MED ORDER — HYDROMORPHONE HCL 1 MG/ML IJ SOLN: 0.2500 mg | INTRAMUSCULAR | Status: DC | PRN

## 2015-11-15 MED ORDER — BRIMONIDINE TARTRATE 0.15 % OP SOLN
1.0000 [drp] | Freq: Every day | OPHTHALMIC | Status: DC
  Administered 2015-11-15: 1 [drp] via OPHTHALMIC
  Filled 2015-11-15 (×2): qty 5

## 2015-11-15 MED ORDER — ASPIRIN 81 MG PO CHEW
81.0000 mg | CHEWABLE_TABLET | Freq: Every day | ORAL | Status: DC
  Administered 2015-11-15 – 2015-11-16 (×2): 81 mg via ORAL
  Filled 2015-11-15 (×2): qty 1

## 2015-11-15 MED ORDER — LORAZEPAM 2 MG/ML IJ SOLN
1.0000 mg | Freq: Once | INTRAMUSCULAR | Status: AC
  Administered 2015-11-15: 1 mg via INTRAVENOUS
  Filled 2015-11-15: qty 1

## 2015-11-15 MED ORDER — LORAZEPAM 0.5 MG PO TABS: 0.5000 mg | ORAL_TABLET | Freq: Two times a day (BID) | ORAL | Status: DC | PRN

## 2015-11-15 MED ORDER — LORAZEPAM 2 MG/ML IJ SOLN: 1.0000 mg | Freq: Four times a day (QID) | INTRAMUSCULAR | Status: DC | PRN

## 2015-11-15 MED ORDER — FINASTERIDE 5 MG PO TABS
5.0000 mg | ORAL_TABLET | Freq: Every day | ORAL | Status: DC
  Administered 2015-11-15 – 2015-11-16 (×2): 5 mg via ORAL
  Filled 2015-11-15 (×2): qty 1

## 2015-11-15 MED ORDER — ACETAMINOPHEN 500 MG PO TABS
1000.0000 mg | ORAL_TABLET | Freq: Three times a day (TID) | ORAL | Status: DC
  Administered 2015-11-16: 1000 mg via ORAL
  Filled 2015-11-15: qty 2

## 2015-11-15 MED ORDER — GABAPENTIN 100 MG PO CAPS
200.0000 mg | ORAL_CAPSULE | Freq: Two times a day (BID) | ORAL | Status: DC
  Administered 2015-11-16: 200 mg via ORAL
  Filled 2015-11-15: qty 2

## 2015-11-15 NOTE — Progress Notes (Addendum)
CSW informed by Psychiatrist that patient is psychiatrically cleared and ready for discharge.   9:43am- CSW left message with name and contact with Suanne Marker at [facility] [phone] to give to the Admissions Coordinator regarding patient.  10:50am- CSW called back to speak with Admissions Coordinator and was informed by Suanne Marker that she was given the message to call CSW. She stated their is no direct line to leave message for the Admissions Coordinator.    Genice Rouge [id number] ED CSW 11/15/2015 9:49 AM

## 2015-11-15 NOTE — Consult Note (Signed)
[name] is followed by [facility]. We have been trying to coordinate his medications and as he is new resident there and the transition has been difficult. He has had multiple falls- He has vascular dementia progression with gait disorder and severe sundowning and periods of agitated behavior.   Recommend the following:  1. Given acute situation benzodiazapines in higher than usual dose have helped in the past- ideally would be nice to have in a safe environment while these meds could be tirtrated and eventually get him off the benzodiazapines. Consider [facility].  2. He has chronic pain- low back radiculopathy0- this may be driving delirium but he cannot express that- started scheduled Tylenol high dose and neurontin which will also help with mood stabilization- can also add on Depakote as a good option.  3. Added bowel regimen  4. Changed lexapro to Citalapram.  5. Continue Xyprexa at 5 -in past 10 has over sedated him.   Will see him in AM. Updated his daughter by phone.   Thank you for allowing [place] to help with his complex symptom management- I suspect pain may be a significant driver- he has agitation with oxycodone -so I have been hesitatant to use opiates. However in the acute setting a low dose of hydromorphone may be reasonable.    Lane Hacker, DO Palliative Medicine [phone]

## 2015-11-15 NOTE — ED Notes (Signed)
Notified pharmacy that medicine, [facility], is not in the patient bin. Unable to administer at this time.

## 2015-11-15 NOTE — ED Notes (Addendum)
[name] called by this RN to update plan to discharge with medication changes. [name] request psychiatry to call [name] related to medication changes prior to discharge. This RN handed psychiatry [name] number face to face. [name] verbalizes will call [name].

## 2015-11-15 NOTE — Progress Notes (Signed)
CSW was informed by NP that patient is now for inpatient treatment at this time. NP notes if patient stabilizes before being accepted at a facility, patient will be discharged back to [facility].   CSW spoke with Dedra Skeens, Memory Care Director at [facility] [phone] to inform her that patient is now for inpatient treatment at this time. CSW informed her should patient stabilize in the ED before being accepted at a facility, he would return to [facility]. She stated she could be contacted at the same number.   Genice Rouge [id number] ED CSW 11/15/2015 11:33 AM

## 2015-11-15 NOTE — ED Notes (Addendum)
Edmond (daughter) [phone] Gwynn Bernstein (daughter)

## 2015-11-15 NOTE — ED Notes (Signed)
Awaiting meds from pharmacy to give all morning meds together. Pt still sleeping.

## 2015-11-15 NOTE — BH Assessment (Addendum)
Assessment Note  [name] is an 82 y.o. male. He presents to [facility] from [facility] for a evaluation of aggressive behaviors. Per [facility] staff, patient dx's with Dementia and his room at the facility is on the Dementia Unit. Patient living at the facility x2 weeks. Writer tried to wake patient up for his assessment but he was not able to be aroused. Writer contacted [facility] staff for collateral information. Spoke to Camera operator, Carma Leaven.  Sts that ast night he became extremely agitated. Patient was reportedly difficult to redirect telling staff at [facility] that he had to go to work. He wanders in other patient's rooms. Staff witnessed patient pulling pictures off the walls and playing with hand sanitizer machines on the wall. Patient has a history of touching people inappropriately and this behavior has continued since living at the facility. Writer unable to confirm or deny SI, HI, and AVH's. Nursing home staff state that this patient does not have a psychiatric history. No history of INPT psychiatric hospitalizations. No therapist or psychiatrist. Patient is compliant with medication regiments. 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  Diagnosis: Dementia with Behavioral Disturbance  Past Medical History:  Past Medical History:  Diagnosis Date  . Abdominal aortic aneurysm ([place])    a. 07/2014 Abd U/S: 3.61 cm, RCIA >50d.  Marland Kitchen Aphthous ulcer   . BPH (benign prostatic hypertrophy)   . CAD (coronary artery disease)    a. s/p CABG 1993;  b. cath 10/10: severe native 3v CAD, S-Dx ok, S-OM1/OM2 ok, S-PDA ok, L-LAD ok, EF 60%.  . Carotid stenosis    a. s/p Left CEA, followed by Dr. Donnetta Hutching;  b. 07/2014 u/s: RICA < 20, LICA patent.  . Cervical spondylosis without myelopathy   . CKD (chronic kidney disease), stage III   . GERD (gastroesophageal reflux disease)   . History of nephrolithiasis   . HOH (hard of hearing)    hearing aids  . Hyperlipidemia    . Memory deficits 06/19/2013  . Pre-syncope   . Radicular low back pain   . Throat cancer ([place])   . 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 Unspecified adverse effect of unspecified drug, medicinal and biological substance     Past Surgical History:  Procedure Laterality Date  . BACK SURGERY    . CARDIAC CATHETERIZATION    . CAROTID ENDARTERECTOMY Left   . CHOLECYSTECTOMY    . CORONARY ARTERY BYPASS GRAFT    . CORONARY ARTERY BYPASS GRAFT  1998  . HERNIA REPAIR     unclear which side  . INGUINAL HERNIA REPAIR Right 09/29/2013   Procedure: HERNIA REPAIR INGUINAL ADULT;  Surgeon: Rolm Bookbinder, MD;  Location: [facility];  Service: General;  Laterality: Right;  . INSERTION OF MESH Right 09/29/2013   Procedure: INSERTION OF MESH;  Surgeon: Rolm Bookbinder, MD;  Location: [facility];  Service: General;  Laterality: Right;  . PROSTATE SURGERY    . renal stone surgery     3 times  . THROAT SURGERY      Family History:  Family History  Problem Relation Age of Onset  . Heart disease Father   . Stroke Father   . AAA (abdominal aortic aneurysm) Sister   . AAA (abdominal aortic aneurysm) Brother   . Stroke Brother   . Coronary artery disease Brother   . Heart disease Brother   . Coronary artery disease      Social History:  reports  that he quit smoking about 43 years ago. His smoking use included Cigarettes. He has a 30.00 pack-year smoking history. His smokeless tobacco use includes Chew. He reports that he does not drink alcohol or use drugs.  Additional Social History:  Alcohol / Drug Use Pain Medications: SEE MAR Prescriptions: SEE MAR Over the Counter: SEE MAR History of alcohol / drug use?: No history of alcohol / drug abuse  CIWA: CIWA-Ar BP: 132/74 Pulse Rate: 60 COWS:    Allergies:  Allergies  Allergen Reactions  . Avinza [Morphine Sulfate] Other (See Comments)    Altered mental status. "about killed him"  . Doxycycline Calcium Other (See Comments)     Causes patient chronic colitis; was hospitalized two weeks due to med.  . Vibramycin [Doxycycline Hyclate] Other (See Comments)    Causes patient colitis; was hospitalized two weeks due to med  . Flomax [Tamsulosin Hcl] Other (See Comments)    Dizziness  . Statins Other (See Comments)    Muscle pain; can tolerate crestor 10 mg every other day  . Codeine Sulfate Nausea And Vomiting  . Oxycodone Other (See Comments)    Angry; altered mental status; "wild and crazy"    Home Medications:  (Not in a hospital admission)  OB/GYN Status:  No LMP for male patient.  General Assessment Data [facility] of Assessment: [facility] TTS Assessment: In system Is this a Tele or Face-to-Face Assessment?: Face-to-Face Is this an Initial Assessment or a Re-assessment for this encounter?: Initial Assessment Marital status: Widowed [place] name:  (n/a) Is patient pregnant?: No Pregnancy Status: No Living Arrangements: Other (Comment) ([facility]ementia [facility]) Can pt return to current living arrangement?: Yes Admission Status: Voluntary Is patient capable of signing voluntary admission?: Yes Referral Source: Self/Family/Friend Insurance type:  (Medicare and Administrator, Civil Service)  Medical Screening Exam ([place]) Medical Exam completed: No Reason for MSE not completed: Other:  Crisis Care Plan Living Arrangements: Other (Comment) ([facility]) Legal Guardian:  (medical POA Mykal Bednarczyk [phone]) Name of Psychiatrist:  (no psychiatrist) Name of Therapist:  (no therapist )  Education Status Is patient currently in school?: No Current Grade:  (n/a) Highest grade of school patient has completed:  (unk) Name of school:  (n/a) Contact person:  (n/a)  Risk to self with the past 6 months Suicidal Ideation:  (PT unable to confirm or deny; per NH staff no issues with SI) Has patient been a risk to self within the past 6 months prior to admission? : No (no according to NH staff  ) Suicidal Intent: No (no according to NH staff ) Has patient had any suicidal intent within the past 6 months prior to admission? : No Is patient at risk for suicide?: No Suicidal Plan?: No (No according to NH staff) Has patient had any suicidal plan within the past 6 months prior to admission? : No Access to Means:  (pictures on wall at [facility] facility??) What has been your use of drugs/alcohol within the last 12 months?: denies  (nursing home staff unaware of substance abuse history ) Previous Attempts/Gestures: No (no psychiatric history per nursing home staff) How many times?:  (0) Other Self Harm Risks:  (denies ) Triggers for Past Attempts:  (no previous attempts or gestures) Intentional Self Injurious Behavior: None Family Suicide History: Unknown Recent stressful life event(s): Other (Comment) (no stressors reported) Persecutory voices/beliefs?: No Depression:  (unk; no stressors reported) Depression Symptoms:  (unk) Substance abuse history and/or treatment for substance abuse?: No Suicide prevention  information given to non-admitted patients: Not applicable  Risk to Others within the past 6 months Homicidal Ideation:  (Pt did not confirm or deny; Staff at NH stated no HI) Does patient have any lifetime risk of violence toward others beyond the six months prior to admission? : No Thoughts of Harm to Others: No Current Homicidal Intent: No Current Homicidal Plan: No Access to Homicidal Means: No Identified Victim:  (n/a) History of harm to others?:  (per NH staff patient touches people inappropriately) Assessment of Violence: None Noted Violent Behavior Description:  (confused; not following simple confused) Does patient have access to weapons?: No (taking pictures off wall at [facility]) Criminal Charges Pending?: No Does patient have a court date: No Is patient on probation?: No  Psychosis Hallucinations:  (increasing confused; NH staff do no think pt is hearing voic) Delusions:  Unspecified (unable to confirm or deny)  Mental Status Report Appearance/Hygiene: In hospital gown Eye Contact: Unable to Assess Motor Activity: Unable to assess Speech: Unable to assess Level of Consciousness: Unable to assess Mood:  (UTA) Affect: Unable to Assess Anxiety Level:  (UTA) Thought Processes: Unable to Assess Judgement: Unable to Assess Orientation: Unable to assess Obsessive Compulsive Thoughts/Behaviors: Unable to Assess  Cognitive Functioning Concentration: Unable to Assess Memory: Unable to Assess IQ: Average Insight: Unable to Assess Impulse Control: Unable to Assess Appetite: Good Weight Loss:  (none reported) Weight Gain:  (none reported) Sleep: No Change Total Hours of Sleep:  (patient sleeps 6 to 8 hrs at night) Vegetative Symptoms: Unable to Assess  ADLScreening [facility] Assessment Services) Patient's cognitive ability adequate to safely complete daily activities?: Yes Patient able to express need for assistance with ADLs?: Yes Independently performs ADLs?: No  Prior Inpatient Therapy Prior Inpatient Therapy: No Prior Therapy Dates:  (n/a) Prior Therapy Facilty/Provider(s):  (n/a) Reason for Treatment:  (n/a)  Prior Outpatient Therapy Prior Outpatient Therapy: No Prior Therapy Dates:  (n/a) Prior Therapy Facilty/Provider(s):  (n/a) Reason for Treatment:  (n/a) Does patient have an ACCT team?: No Does patient have Intensive In-House Services?  : No Does patient have [facility] services? : No Does patient have P4CC services?: No  ADL Screening (condition at time of admission) Patient's cognitive ability adequate to safely complete daily activities?: Yes Patient able to express need for assistance with ADLs?: Yes Independently performs ADLs?: No             Advance Directives (For Healthcare) Does patient have an advance directive?: No Would patient like information on creating an advanced directive?: No - patient declined  information Nutrition Screen- [facility] Adult/[facility] Patient's home diet: Regular             Disposition:   Disposition Initial Assessment Completed for this Encounter: Yes  On Site Evaluation by:   Reviewed with Physician:  Dr. Darleene Cleaver and Waylan Boga, DNP [facility] 11/15/2015 8:37 AM

## 2015-11-15 NOTE — ED Notes (Signed)
Bed: [id number] Expected date:  Expected time:  Means of arrival:  Comments: 82 yo M/ Aggression

## 2015-11-15 NOTE — Discharge Instructions (Signed)

## 2015-11-15 NOTE — ED Notes (Signed)
Perry at bedside.

## 2015-11-15 NOTE — ED Provider Notes (Signed)
[place] DEPT Provider Note   CSN: [id number] Arrival date & time: 11/15/15  0502     History   Chief Complaint Chief Complaint  Patient presents with  . Aggressive Behavior    HPI [name] is a 82 y.o. male.  Patient presents to the emergency department from facility for evaluation of aggressive behavior. Patient reportedly became extremely agitated earlier and was throwing objects and destroying property. He has been wandering, refusing medication and trying to break into other residents rooms. At arrival to the ER patient is pleasant and agreeable. Clearly has dementia, is not answering questions appropriately. Level V Caveat due to  Dementia.      Past Medical History:  Diagnosis Date  . Abdominal aortic aneurysm ([place])    a. 07/2014 Abd U/S: 3.61 cm, RCIA >50d.  Marland Kitchen Aphthous ulcer   . BPH (benign prostatic hypertrophy)   . CAD (coronary artery disease)    a. s/p CABG 1993;  b. cath 10/10: severe native 3v CAD, S-Dx ok, S-OM1/OM2 ok, S-PDA ok, L-LAD ok, EF 60%.  . Carotid stenosis    a. s/p Left CEA, followed by Dr. Donnetta Hutching;  b. 07/2014 u/s: RICA < 20, LICA patent.  . Cervical spondylosis without myelopathy   . CKD (chronic kidney disease), stage III   . GERD (gastroesophageal reflux disease)   . History of nephrolithiasis   . HOH (hard of hearing)    hearing aids  . Hyperlipidemia   . Memory deficits 06/19/2013  . Pre-syncope   . Radicular low back pain   . Throat cancer ([place])   . 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 Unspecified adverse effect of unspecified drug, medicinal and biological substance     Patient Active Problem List   Diagnosis Date Noted  . UTI (lower urinary tract infection) 09/23/2015  . Acute encephalopathy 09/23/2015  . Depression 10/24/2013  . Postoperative hematoma 10/01/2013  . CKD (chronic kidney disease), stage III 09/28/2013  . Right inguinal hernia 09/26/2013  . AAA (abdominal aortic aneurysm) without rupture  ([place]) 07/22/2013  . Cervical spondylosis without myelopathy 06/19/2013  . Memory deficits 06/19/2013  . PVD- hx CEA,patent carotids by doppler July 2015 09/26/2010  . 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 Essential hypertension 03/16/2009  . Low back pain potentially associated with radiculopathy 03/19/2008  . BPH (benign prostatic hyperplasia) 11/25/2007  . GERD 10/24/2006  . Hyperlipidemia 09/14/2006  . CAD (coronary artery disease) s/p bypass graft 09/14/2006  . NEPHROLITHIASIS, HX OF 09/14/2006    Past Surgical History:  Procedure Laterality Date  . BACK SURGERY    . CARDIAC CATHETERIZATION    . CAROTID ENDARTERECTOMY Left   . CHOLECYSTECTOMY    . CORONARY ARTERY BYPASS GRAFT    . CORONARY ARTERY BYPASS GRAFT  1998  . HERNIA REPAIR     unclear which side  . INGUINAL HERNIA REPAIR Right 09/29/2013   Procedure: HERNIA REPAIR INGUINAL ADULT;  Surgeon: Rolm Bookbinder, MD;  Location: [place];  Service: General;  Laterality: Right;  . INSERTION OF MESH Right 09/29/2013   Procedure: INSERTION OF MESH;  Surgeon: Rolm Bookbinder, MD;  Location: [place];  Service: General;  Laterality: Right;  . PROSTATE SURGERY    . renal stone surgery     3 times  . THROAT SURGERY         Home Medications    Prior to Admission medications   Medication Sig Start Date End Date Taking? Authorizing Provider  acetaminophen (  TYLENOL) 500 MG tablet Take 2 tablets (1,000 mg total) by mouth every 8 (eight) hours. 09/29/15  Yes Acquanetta Chain, DO  ALPHAGAN P 0.1 % SOLN Place 1 drop into both eyes at bedtime.  04/24/13  Yes Historical Provider, MD  aspirin 81 MG tablet Take 1 tablet (81 mg total) by mouth daily. Pt takes 1 tablet by mouth on Mon and Fri. 09/29/15  Yes Acquanetta Chain, DO  celecoxib (CELEBREX) 100 MG capsule Take 1 capsule (100 mg total) by mouth 2 (two) times daily. 09/29/15  Yes Acquanetta Chain, DO  escitalopram (LEXAPRO) 20 MG tablet TAKE ONE TABLET BY MOUTH ONCE  DAILY Patient taking differently: TAKE ONE TABLET (20 mg) BY MOUTH ONCE DAILY 08/30/15  Yes Marin Olp, MD  finasteride (PROSCAR) 5 MG tablet Take 5 mg by mouth daily. 11/12/15  Yes Historical Provider, MD  levothyroxine (SYNTHROID, LEVOTHROID) 50 MCG tablet Take 50 mcg by mouth daily before breakfast.   Yes Historical Provider, MD  LORazepam (ATIVAN) 0.5 MG tablet TAKE ONE TABLET BY MOUTH TWICE DAILY AS NEEDED FOR ANXIETY. Patient taking differently: TAKE ONE TABLET BY MOUTH TWICE DAILY. 11/12/15  Yes Marin Olp, MD  LORazepam (ATIVAN) 1 MG tablet Take 1 tablet (1 mg total) by mouth at bedtime. 09/29/15  Yes Shanker Kristeen Mans, MD  LORazepam (ATIVAN) 1 MG tablet Take 1 tablet (1 mg total) by mouth every 6 (six) hours as needed (agitation, sundowning, may be given in addition to scheduled dose). Patient taking differently: Take 0.5 mg by mouth daily.  09/29/15  Yes Shanker Kristeen Mans, MD  OLANZapine (ZYPREXA) 5 MG tablet Take 1 tablet (5 mg total) by mouth at bedtime. 09/29/15  Yes Acquanetta Chain, DO  polyethylene glycol (MIRALAX / GLYCOLAX) packet Take 17 g by mouth 2 (two) times daily. 09/29/15  Yes Acquanetta Chain, DO  Vitamin D, Ergocalciferol, (DRISDOL) 50000 units CAPS capsule Take 50,000 Units by mouth See admin instructions. Patient takes Tuesday and Saturday   Yes Historical Provider, MD  dutasteride (AVODART) 0.5 MG capsule Take 1 capsule (0.5 mg total) by mouth daily. 09/29/15   Acquanetta Chain, DO    Family History Family History  Problem Relation Age of Onset  . Heart disease Father   . Stroke Father   . AAA (abdominal aortic aneurysm) Sister   . AAA (abdominal aortic aneurysm) Brother   . Stroke Brother   . Coronary artery disease Brother   . Heart disease Brother   . Coronary artery disease      Social History Social History  Substance Use Topics  . Smoking status: Former Smoker    Packs/day: 1.00    Years: 30.00    Types: Cigarettes    Quit date:  01/17/1972  . Smokeless tobacco: Current User    Types: Chew  . Alcohol use No     Allergies   Avinza [morphine sulfate]; Doxycycline calcium; Vibramycin [doxycycline hyclate]; Flomax [tamsulosin hcl]; Statins; Codeine sulfate; and Oxycodone   Review of Systems Review of Systems  Unable to perform ROS: Dementia     Physical Exam Updated Vital Signs BP 130/76   Pulse 61   Temp 97.3 F (36.3 C) (Oral)   Resp 14   SpO2 95%   Physical Exam  Constitutional: He appears well-developed and well-nourished. No distress.  HENT:  Head: Normocephalic and atraumatic.  Right Ear: Hearing normal.  Left Ear: Hearing normal.  Nose: Nose normal.  Mouth/Throat: Oropharynx is clear and  moist and mucous membranes are normal.  Eyes: Conjunctivae and EOM are normal. Pupils are equal, round, and reactive to light.  Neck: Normal range of motion. Neck supple.  Cardiovascular: Regular rhythm, S1 normal and S2 normal.  Exam reveals no gallop and no friction rub.   No murmur heard. Pulmonary/Chest: Effort normal and breath sounds normal. No respiratory distress. He exhibits no tenderness.  Abdominal: Soft. Normal appearance and bowel sounds are normal. There is no hepatosplenomegaly. There is no tenderness. There is no rebound, no guarding, no tenderness at McBurney's point and negative Murphy's sign. No hernia.  Musculoskeletal: Normal range of motion.  Neurological: He is alert. He has normal strength. He is disoriented. No cranial nerve deficit or sensory deficit. Coordination normal. GCS eye subscore is 4. GCS verbal subscore is 4. GCS motor subscore is 6.  Skin: Skin is warm, dry and intact. No rash noted. No cyanosis.  Psychiatric: He has a normal mood and affect. His speech is normal and behavior is normal. Thought content normal.  Nursing note and vitals reviewed.    ED Treatments / Results  Labs (all labs ordered are listed, but only abnormal results are displayed) Labs Reviewed   COMPREHENSIVE METABOLIC PANEL - Abnormal; Notable for the following:       Result Value   Potassium 3.3 (*)    Chloride 113 (*)    ALT 16 (*)    Total Bilirubin 1.8 (*)    GFR calc non Af Amer 58 (*)    All other components within normal limits  URINALYSIS, ROUTINE W REFLEX MICROSCOPIC (NOT AT [facility]) - Abnormal; Notable for the following:    Color, Urine AMBER (*)    APPearance CLOUDY (*)    Hgb urine dipstick LARGE (*)    Bilirubin Urine SMALL (*)    Leukocytes, UA SMALL (*)    All other components within normal limits  RAPID URINE DRUG SCREEN, HOSP PERFORMED - Abnormal; Notable for the following:    Benzodiazepines POSITIVE (*)    All other components within normal limits  URINE MICROSCOPIC-ADD ON - Abnormal; Notable for the following:    Squamous Epithelial / LPF 0-5 (*)    Bacteria, UA RARE (*)    All other components within normal limits  CBC WITH DIFFERENTIAL/PLATELET  ETHANOL    EKG  EKG Interpretation  Date/Time:  Monday November 15 2015 05:24:40 EDT Ventricular Rate:  57 PR Interval:    QRS Duration: 96 QT Interval:  522 QTC Calculation: 509 R Axis:   2 Text Interpretation:  Sinus rhythm Supraventricular bigeminy Borderline T wave abnormalities Prolonged QT interval No significant change since last tracing Confirmed by Christe Tellez  MD, Barlow Harrison [phone]) on 11/15/2015 6:07:00 AM       Radiology Ct Head Wo Contrast  Result Date: 11/15/2015 CLINICAL DATA:  Aggressive behavior.  Altered mental status. EXAM: CT HEAD WITHOUT CONTRAST TECHNIQUE: Contiguous axial images were obtained from the base of the skull through the vertex without intravenous contrast. COMPARISON:  11/08/2015 FINDINGS: Brain: No evidence of acute infarction, hemorrhage, hydrocephalus, extra-axial collection or mass lesion/mass effect. There is mild to moderate generalized atrophy. There is mild periventricular white matter hypodensity which may represent chronic small vessel ischemic disease.  Vascular: No hyperdense vessel or unexpected calcification. Skull: Normal. Negative for fracture or focal lesion. Sinuses/Orbits: No acute finding. Other: None. IMPRESSION: No acute intracranial findings. There is mild generalized atrophy and chronic appearing white matter hypodensities which likely represent small vessel ischemic disease. Electronically Signed  By: Andreas Newport M.D.   On: 11/15/2015 06:03   Dg Chest Port 1 View  Result Date: 11/15/2015 CLINICAL DATA:  Altered mental status. EXAM: PORTABLE CHEST 1 VIEW COMPARISON:  09/24/2015 FINDINGS: A single AP portable view of the chest demonstrates no focal airspace consolidation or alveolar edema. The lungs are grossly clear. There is no large effusion or pneumothorax. There is unchanged cardiomegaly. Cardiac and mediastinal contours are otherwise unremarkable. IMPRESSION: Stable mild cardiomegaly.  No acute findings. Electronically Signed   By: Andreas Newport M.D.   On: 11/15/2015 05:36    Procedures Procedures (including critical care time)  Medications Ordered in ED Medications  potassium chloride SA (K-DUR,KLOR-CON) CR tablet 40 mEq (40 mEq Oral Given 11/15/15 [id number])     Initial Impression / Assessment and Plan / ED Course  I have reviewed the triage vital signs and the nursing notes.  Pertinent labs & imaging results that were available during my care of the patient were reviewed by me and considered in my medical decision making (see chart for details).  Clinical Course    Patient sent to the emergency room for evaluation of aggressive behavior. Patient has reportedly been quite aggressive at the facility where he resides, however is cooperative here in the emergency department. His medical workup has been negative. He will require psychiatric evaluation to determine if he needs medication adjustments for behavior purposes. Patient is medically clear for evaluation.  Final Clinical Impressions(s) / ED Diagnoses    Final diagnoses:  Aggressive behavior    New Prescriptions New Prescriptions   No medications on file     Orpah Greek, MD 11/15/15 [phone]

## 2015-11-15 NOTE — ED Triage Notes (Signed)
Brought in by EMS from [facility] NH facility with c/o aggressive behavior.  Pt has been uncooperative and difficult to redirect----- has been pulling things off the wall in the dining room, entering other resident's rooms, refusing incontinence care.  Pt has dementia.  Pt arrived to ED quiet and cooperative and able to follow sinple commands.

## 2015-11-15 NOTE — BH Assessment (Signed)
[facility] Assessment Progress Note  Per Corena Pilgrim, MD, this pt requires psychiatric hospitalization at this time.  The following facilities have been contacted to seek placement for this pt, with results as noted:  Beds available, information sent, decision pending:  [place]:  [facility] [place]   Not suitable for the following programs due to dementia:  [place], [place] Triage Specialist [phone]

## 2015-11-15 NOTE — ED Notes (Signed)
Unable to collect labs at this time TTS is in the room talking with patient

## 2015-11-15 NOTE — ED Notes (Signed)
Psychiatry team calling [facility] to decide plan of care: attempt to place or discharge.

## 2015-11-15 NOTE — ED Notes (Signed)
Dr. Lane Hacker called to ask for an order for pallative care so she could care for this patient since she had been seeing him at the facility where he resides. Notified Dr. Ivar Bury of Dr. Douglass Rivers request. Called back Dr. Hilma Favors and she gave a verbal order and reports she will place additional orders for tonight. Please call back in one hour if he is not settled at [phone].

## 2015-11-15 NOTE — ED Notes (Signed)
Pt is sleeping

## 2015-11-16 ENCOUNTER — Emergency Department (HOSPITAL_COMMUNITY): Payer: Medicare Other

## 2015-11-16 DIAGNOSIS — Z515 Encounter for palliative care: Secondary | ICD-10-CM

## 2015-11-16 DIAGNOSIS — M1991 Primary osteoarthritis, unspecified site: Secondary | ICD-10-CM | POA: Diagnosis present

## 2015-11-16 DIAGNOSIS — Z87891 Personal history of nicotine dependence: Secondary | ICD-10-CM

## 2015-11-16 DIAGNOSIS — F919 Conduct disorder, unspecified: Secondary | ICD-10-CM | POA: Diagnosis not present

## 2015-11-16 DIAGNOSIS — Z885 Allergy status to narcotic agent status: Secondary | ICD-10-CM | POA: Diagnosis not present

## 2015-11-16 DIAGNOSIS — K5901 Slow transit constipation: Secondary | ICD-10-CM | POA: Diagnosis not present

## 2015-11-16 DIAGNOSIS — I129 Hypertensive chronic kidney disease with stage 1 through stage 4 chronic kidney disease, or unspecified chronic kidney disease: Secondary | ICD-10-CM | POA: Diagnosis present

## 2015-11-16 DIAGNOSIS — Z823 Family history of stroke: Secondary | ICD-10-CM

## 2015-11-16 DIAGNOSIS — R0989 Other specified symptoms and signs involving the circulatory and respiratory systems: Secondary | ICD-10-CM | POA: Diagnosis not present

## 2015-11-16 DIAGNOSIS — I714 Abdominal aortic aneurysm, without rupture: Secondary | ICD-10-CM | POA: Diagnosis present

## 2015-11-16 DIAGNOSIS — R4182 Altered mental status, unspecified: Secondary | ICD-10-CM | POA: Diagnosis not present

## 2015-11-16 DIAGNOSIS — N183 Chronic kidney disease, stage 3 (moderate): Secondary | ICD-10-CM | POA: Diagnosis present

## 2015-11-16 DIAGNOSIS — K219 Gastro-esophageal reflux disease without esophagitis: Secondary | ICD-10-CM | POA: Diagnosis not present

## 2015-11-16 DIAGNOSIS — F0391 Unspecified dementia with behavioral disturbance: Secondary | ICD-10-CM | POA: Diagnosis present

## 2015-11-16 DIAGNOSIS — E785 Hyperlipidemia, unspecified: Secondary | ICD-10-CM | POA: Diagnosis present

## 2015-11-16 DIAGNOSIS — E039 Hypothyroidism, unspecified: Secondary | ICD-10-CM | POA: Diagnosis present

## 2015-11-16 DIAGNOSIS — E876 Hypokalemia: Secondary | ICD-10-CM | POA: Diagnosis present

## 2015-11-16 DIAGNOSIS — F29 Unspecified psychosis not due to a substance or known physiological condition: Secondary | ICD-10-CM | POA: Diagnosis not present

## 2015-11-16 DIAGNOSIS — F0390 Unspecified dementia without behavioral disturbance: Secondary | ICD-10-CM | POA: Diagnosis not present

## 2015-11-16 DIAGNOSIS — Z9889 Other specified postprocedural states: Secondary | ICD-10-CM | POA: Diagnosis not present

## 2015-11-16 DIAGNOSIS — N289 Disorder of kidney and ureter, unspecified: Secondary | ICD-10-CM | POA: Diagnosis present

## 2015-11-16 DIAGNOSIS — F329 Major depressive disorder, single episode, unspecified: Secondary | ICD-10-CM | POA: Diagnosis present

## 2015-11-16 DIAGNOSIS — Z9183 Wandering in diseases classified elsewhere: Secondary | ICD-10-CM | POA: Diagnosis not present

## 2015-11-16 DIAGNOSIS — F0151 Vascular dementia with behavioral disturbance: Secondary | ICD-10-CM | POA: Diagnosis not present

## 2015-11-16 DIAGNOSIS — F918 Other conduct disorders: Secondary | ICD-10-CM | POA: Diagnosis not present

## 2015-11-16 DIAGNOSIS — Z7982 Long term (current) use of aspirin: Secondary | ICD-10-CM | POA: Diagnosis not present

## 2015-11-16 DIAGNOSIS — Z888 Allergy status to other drugs, medicaments and biological substances status: Secondary | ICD-10-CM | POA: Diagnosis not present

## 2015-11-16 DIAGNOSIS — Z881 Allergy status to other antibiotic agents status: Secondary | ICD-10-CM | POA: Diagnosis not present

## 2015-11-16 DIAGNOSIS — Z79899 Other long term (current) drug therapy: Secondary | ICD-10-CM | POA: Diagnosis not present

## 2015-11-16 DIAGNOSIS — Z8589 Personal history of malignant neoplasm of other organs and systems: Secondary | ICD-10-CM | POA: Diagnosis not present

## 2015-11-16 DIAGNOSIS — Z85818 Personal history of malignant neoplasm of other sites of lip, oral cavity, and pharynx: Secondary | ICD-10-CM | POA: Diagnosis not present

## 2015-11-16 DIAGNOSIS — N400 Enlarged prostate without lower urinary tract symptoms: Secondary | ICD-10-CM | POA: Diagnosis present

## 2015-11-16 DIAGNOSIS — I4581 Long QT syndrome: Secondary | ICD-10-CM | POA: Diagnosis present

## 2015-11-16 DIAGNOSIS — Z8249 Family history of ischemic heart disease and other diseases of the circulatory system: Secondary | ICD-10-CM | POA: Diagnosis not present

## 2015-11-16 DIAGNOSIS — Z8489 Family history of other specified conditions: Secondary | ICD-10-CM

## 2015-11-16 DIAGNOSIS — Z951 Presence of aortocoronary bypass graft: Secondary | ICD-10-CM | POA: Diagnosis not present

## 2015-11-16 DIAGNOSIS — I739 Peripheral vascular disease, unspecified: Secondary | ICD-10-CM | POA: Diagnosis present

## 2015-11-16 DIAGNOSIS — Z66 Do not resuscitate: Secondary | ICD-10-CM | POA: Diagnosis present

## 2015-11-16 DIAGNOSIS — Z9181 History of falling: Secondary | ICD-10-CM | POA: Diagnosis not present

## 2015-11-16 DIAGNOSIS — M150 Primary generalized (osteo)arthritis: Secondary | ICD-10-CM | POA: Diagnosis not present

## 2015-11-16 DIAGNOSIS — I2510 Atherosclerotic heart disease of native coronary artery without angina pectoris: Secondary | ICD-10-CM | POA: Diagnosis present

## 2015-11-16 DIAGNOSIS — R456 Violent behavior: Secondary | ICD-10-CM | POA: Diagnosis not present

## 2015-11-16 MED ORDER — POTASSIUM CHLORIDE 20 MEQ/15ML (10%) PO SOLN
40.0000 meq | Freq: Once | ORAL | Status: AC
  Administered 2015-11-16: 40 meq via ORAL
  Filled 2015-11-16: qty 30

## 2015-11-16 NOTE — BH Assessment (Signed)
[facility] Assessment Progress Note  Per Corena Pilgrim, MD, this pt continues to require psychiatric hospitalization.  At 14:49 this Probation officer spoke to [place] at [facility].  Pt has been accepted to their facility by Dr Alonna Minium.  Shirlee Limerick agrees that pt's daughter, who is his health care power of attorney, will sign the [facility] consent for admission form.  Pt's daughter, Jaquavion Leftridge, identified on pt's health care power of attorney documents as health care power of attorney, has signed form, which I have faxed back to [facility].  Serena Colonel, NP, concurs with decision to transfer pt.  Pt's nurse has been notified, and agrees to call report to [phone], and to send pt via [place], along with original consent form.  Jalene Mullet, [place] Triage Specialist [phone]

## 2015-11-16 NOTE — Consult Note (Signed)
[facility] Psych ED Progress Note  11/16/2015 11:31 AM [name]  MRN:  [id number] Subjective:  82 year old man with history of Vascular dementia and other numerous medical issues who was brought to [facility] from his memory care home for evaluation of agitation and aggressive behavior. Patient is seen today and interviewed. He appears pleasant and calm but his behavior could be unpredictable due to occasional sun downing from Dementia.   Principal Problem: Vascular dementia with behavior disturbance Diagnosis:   Patient Active Problem List   Diagnosis Date Noted  . Vascular dementia with behavior disturbance [F01.51] 11/15/2015    Priority: High  . Palliative care patient [Z51.5] 11/15/2015  . UTI (lower urinary tract infection) [N39.0] 09/23/2015  . Acute encephalopathy [G93.40] 09/23/2015  . Depression [F32.9] 10/24/2013  . Postoperative hematoma [IMO0002] 10/01/2013  . CKD (chronic kidney disease), stage III [N18.3] 09/28/2013  . Right inguinal hernia [K40.90] 09/26/2013  . AAA (abdominal aortic aneurysm) without rupture ([place]) [I71.4] 07/22/2013  . Cervical spondylosis without myelopathy [M47.812] 06/19/2013  . Memory deficits [R41.3] 06/19/2013  . PVD- hx CEA,patent carotids by doppler July 2015 [I73.9] 09/26/2010  . 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 Essential hypertension [I10] 03/16/2009  . Low back pain potentially associated with radiculopathy [M54.5] 03/19/2008  . BPH (benign prostatic hyperplasia) [N40.0] 11/25/2007  . GERD [K21.9] 10/24/2006  . Hyperlipidemia [E78.5] 09/14/2006  . CAD (coronary artery disease) s/p bypass graft [I25.10] 09/14/2006  . NEPHROLITHIASIS, HX OF [Z87.442] 09/14/2006   Total Time spent with patient: 25 minutes  Past Psychiatric History: as above  Past Medical History:  Past Medical History:  Diagnosis Date  . Abdominal aortic aneurysm ([place])    a. 07/2014 Abd U/S: 3.61 cm, RCIA >50d.  Marland Kitchen Aphthous ulcer   . BPH (benign prostatic  hypertrophy)   . CAD (coronary artery disease)    a. s/p CABG 1993;  b. cath 10/10: severe native 3v CAD, S-Dx ok, S-OM1/OM2 ok, S-PDA ok, L-LAD ok, EF 60%.  . Carotid stenosis    a. s/p Left CEA, followed by Dr. Donnetta Hutching;  b. 07/2014 u/s: RICA < 20, LICA patent.  . Cervical spondylosis without myelopathy   . CKD (chronic kidney disease), stage III   . GERD (gastroesophageal reflux disease)   . History of nephrolithiasis   . HOH (hard of hearing)    hearing aids  . Hyperlipidemia   . Memory deficits 06/19/2013  . Pre-syncope   . Radicular low back pain   . Throat cancer ([place])   . 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 Unspecified adverse effect of unspecified drug, medicinal and biological substance     Past Surgical History:  Procedure Laterality Date  . BACK SURGERY    . CARDIAC CATHETERIZATION    . CAROTID ENDARTERECTOMY Left   . CHOLECYSTECTOMY    . CORONARY ARTERY BYPASS GRAFT    . CORONARY ARTERY BYPASS GRAFT  1998  . HERNIA REPAIR     unclear which side  . INGUINAL HERNIA REPAIR Right 09/29/2013   Procedure: HERNIA REPAIR INGUINAL ADULT;  Surgeon: Rolm Bookbinder, MD;  Location: [place];  Service: General;  Laterality: Right;  . INSERTION OF MESH Right 09/29/2013   Procedure: INSERTION OF MESH;  Surgeon: Rolm Bookbinder, MD;  Location: [place];  Service: General;  Laterality: Right;  . PROSTATE SURGERY    . renal stone surgery     3 times  . THROAT SURGERY     Family History:  Family History  Problem Relation Age of Onset  . Heart disease Father   . Stroke Father   . AAA (abdominal aortic aneurysm) Sister   . AAA (abdominal aortic aneurysm) Brother   . Stroke Brother   . Coronary artery disease Brother   . Heart disease Brother   . Coronary artery disease     Family Psychiatric  History:   Social History:  History  Alcohol Use No     History  Drug Use No    Social History   Social History  . Marital status: Widowed    Spouse name: Everlene Farrier    . Number of children: 3  . Years of education: 11th   Occupational History  . Retired    Social History Main Topics  . Smoking status: Former Smoker    Packs/day: 1.00    Years: 30.00    Types: Cigarettes    Quit date: 01/17/1972  . Smokeless tobacco: Current User    Types: Chew  . Alcohol use No  . Drug use: No  . Sexual activity: Not Currently   Other Topics Concern  . None   Social History Narrative   Married to Ms. Bradby who is patient of Dr. Yong Channel. Lives with wife. Married 1953       3 girls (1 is a patient here-[name]). 2 grandkids. Daughter Neoma Laming checks in most days. Other daughter Lynelle Smoke has also been to visits.       Retired from [employer]: mowing the yard, time with wife, church sparingly      Patient drinks about 4-5 sodas daily.   Patient is right handed.    Sleep: Fair  Appetite:  Fair  Current Medications: Current Facility-Administered Medications  Medication Dose Route Frequency Provider Last Rate Last Dose  . acetaminophen (TYLENOL) tablet 1,000 mg  1,000 mg Oral TID Acquanetta Chain, DO   1,000 mg at 11/16/15 1001  . acetaminophen (TYLENOL) tablet 650 mg  650 mg Oral Q4H PRN Orpah Greek, MD      . aspirin chewable tablet 81 mg  81 mg Oral Daily Orpah Greek, MD   81 mg at 11/16/15 1001  . brimonidine (ALPHAGAN) 0.15 % ophthalmic solution 1 drop  1 drop Both Eyes QHS Orpah Greek, MD   1 drop at 11/15/15 2220  . celecoxib (CELEBREX) capsule 100 mg  100 mg Oral BID Orpah Greek, MD   100 mg at 11/16/15 1005  . citalopram (CELEXA) tablet 10 mg  10 mg Oral Daily Acquanetta Chain, DO   10 mg at 11/16/15 1001  . finasteride (PROSCAR) tablet 5 mg  5 mg Oral Daily Orpah Greek, MD   5 mg at 11/16/15 1005  . gabapentin (NEURONTIN) capsule 200 mg  200 mg Oral BID Acquanetta Chain, DO   200 mg at 11/16/15 1001  . HYDROmorphone (DILAUDID) injection 0.25 mg  0.25 mg Intravenous Q4H PRN  Acquanetta Chain, DO      . levothyroxine (SYNTHROID, LEVOTHROID) tablet 50 mcg  50 mcg Oral QAC breakfast Orpah Greek, MD   50 mcg at 11/16/15 0808  . LORazepam (ATIVAN) injection 1-2 mg  1-2 mg Intravenous Q6H PRN Acquanetta Chain, DO      . LORazepam (ATIVAN) tablet 1 mg  1 mg Oral QHS Acquanetta Chain, DO      . OLANZapine (ZYPREXA) tablet 5 mg  5 mg Oral QHS Orpah Greek,  MD   5 mg at 11/15/15 2143  . ondansetron (ZOFRAN) tablet 4 mg  4 mg Oral Q8H PRN Orpah Greek, MD      . potassium chloride 20 MEQ/15ML (10%) solution 40 mEq  40 mEq Oral Once Lurena Nida, NP      . senna (SENOKOT) tablet 8.6 mg  1 tablet Oral QHS Acquanetta Chain, DO       Current Outpatient Prescriptions  Medication Sig Dispense Refill  . acetaminophen (TYLENOL) 500 MG tablet Take 2 tablets (1,000 mg total) by mouth every 8 (eight) hours. 30 tablet 0  . ALPHAGAN P 0.1 % SOLN Place 1 drop into both eyes at bedtime.     Marland Kitchen aspirin 81 MG tablet Take 1 tablet (81 mg total) by mouth daily. Pt takes 1 tablet by mouth on Mon and Fri. 30 tablet 3  . celecoxib (CELEBREX) 100 MG capsule Take 1 capsule (100 mg total) by mouth 2 (two) times daily.    Marland Kitchen escitalopram (LEXAPRO) 20 MG tablet TAKE ONE TABLET BY MOUTH ONCE DAILY (Patient taking differently: TAKE ONE TABLET (20 mg) BY MOUTH ONCE DAILY) 90 tablet 3  . finasteride (PROSCAR) 5 MG tablet Take 5 mg by mouth daily.    Marland Kitchen levothyroxine (SYNTHROID, LEVOTHROID) 50 MCG tablet Take 50 mcg by mouth daily before breakfast.    . LORazepam (ATIVAN) 0.5 MG tablet TAKE ONE TABLET BY MOUTH TWICE DAILY AS NEEDED FOR ANXIETY. (Patient taking differently: TAKE ONE TABLET BY MOUTH TWICE DAILY.) 60 tablet 1  . LORazepam (ATIVAN) 1 MG tablet Take 1 tablet (1 mg total) by mouth at bedtime. 10 tablet 0  . LORazepam (ATIVAN) 1 MG tablet Take 1 tablet (1 mg total) by mouth every 6 (six) hours as needed (agitation, sundowning, may be given in addition to  scheduled dose). (Patient taking differently: Take 0.5 mg by mouth daily. ) 10 tablet 0  . OLANZapine (ZYPREXA) 5 MG tablet Take 1 tablet (5 mg total) by mouth at bedtime. 30 tablet 3  . polyethylene glycol (MIRALAX / GLYCOLAX) packet Take 17 g by mouth 2 (two) times daily. 14 each 0  . Vitamin D, Ergocalciferol, (DRISDOL) 50000 units CAPS capsule Take 50,000 Units by mouth See admin instructions. Patient takes Tuesday and Saturday    . dutasteride (AVODART) 0.5 MG capsule Take 1 capsule (0.5 mg total) by mouth daily. 30 capsule 3    Lab Results:  Results for orders placed or performed during the hospital encounter of 11/15/15 (from the past 48 hour(s))  CBC with Differential/Platelet     Status: None   Collection Time: 11/15/15  5:30 AM  Result Value Ref Range   WBC 5.7 4.0 - 10.5 K/uL   RBC 4.46 4.22 - 5.81 MIL/uL   Hemoglobin 14.0 13.0 - 17.0 g/dL   HCT 42.9 39.0 - 52.0 %   MCV 96.2 78.0 - 100.0 fL   MCH 31.4 26.0 - 34.0 pg   MCHC 32.6 30.0 - 36.0 g/dL   RDW 13.8 11.5 - 15.5 %   Platelets 194 150 - 400 K/uL   Neutrophils Relative % 62 %   Neutro Abs 3.5 1.7 - 7.7 K/uL   Lymphocytes Relative 23 %   Lymphs Abs 1.3 0.7 - 4.0 K/uL   Monocytes Relative 10 %   Monocytes Absolute 0.6 0.1 - 1.0 K/uL   Eosinophils Relative 5 %   Eosinophils Absolute 0.3 0.0 - 0.7 K/uL   Basophils Relative 0 %  Basophils Absolute 0.0 0.0 - 0.1 K/uL  Comprehensive metabolic panel     Status: Abnormal   Collection Time: 11/15/15  5:30 AM  Result Value Ref Range   Sodium 143 135 - 145 mmol/L   Potassium 3.3 (L) 3.5 - 5.1 mmol/L   Chloride 113 (H) 101 - 111 mmol/L   CO2 23 22 - 32 mmol/L   Glucose, Bld 80 65 - 99 mg/dL   BUN 16 6 - 20 mg/dL   Creatinine, Ser 1.14 0.61 - 1.24 mg/dL   Calcium 8.9 8.9 - 10.3 mg/dL   Total Protein 6.5 6.5 - 8.1 g/dL   Albumin 4.0 3.5 - 5.0 g/dL   AST 21 15 - 41 U/L   ALT 16 (L) 17 - 63 U/L   Alkaline Phosphatase 71 38 - 126 U/L   Total Bilirubin 1.8 (H) 0.3 - 1.2  mg/dL   GFR calc non Af Amer 58 (L) >60 mL/min   GFR calc Af Amer >60 >60 mL/min    Comment: (NOTE) The eGFR has been calculated using the CKD EPI equation. This calculation has not been validated in all clinical situations. eGFR's persistently <60 mL/min signify possible Chronic Kidney Disease.    Anion gap 7 5 - 15  Ethanol     Status: None   Collection Time: 11/15/15  5:30 AM  Result Value Ref Range   Alcohol, Ethyl (B) <5 <5 mg/dL    Comment:        LOWEST DETECTABLE LIMIT FOR SERUM ALCOHOL IS 5 mg/dL FOR MEDICAL PURPOSES ONLY   Urinalysis, Routine w reflex microscopic (not at [facility])     Status: Abnormal   Collection Time: 11/15/15  6:05 AM  Result Value Ref Range   Color, Urine AMBER (A) YELLOW    Comment: BIOCHEMICALS MAY BE AFFECTED BY COLOR   APPearance CLOUDY (A) CLEAR   Specific Gravity, Urine 1.024 1.005 - 1.030   pH 5.5 5.0 - 8.0   Glucose, UA NEGATIVE NEGATIVE mg/dL   Hgb urine dipstick LARGE (A) NEGATIVE   Bilirubin Urine SMALL (A) NEGATIVE   Ketones, ur NEGATIVE NEGATIVE mg/dL   Protein, ur NEGATIVE NEGATIVE mg/dL   Nitrite NEGATIVE NEGATIVE   Leukocytes, UA SMALL (A) NEGATIVE  Rapid urine drug screen (hospital performed)     Status: Abnormal   Collection Time: 11/15/15  6:05 AM  Result Value Ref Range   Opiates NONE DETECTED NONE DETECTED   Cocaine NONE DETECTED NONE DETECTED   Benzodiazepines POSITIVE (A) NONE DETECTED   Amphetamines NONE DETECTED NONE DETECTED   Tetrahydrocannabinol NONE DETECTED NONE DETECTED   Barbiturates NONE DETECTED NONE DETECTED    Comment:        DRUG SCREEN FOR MEDICAL PURPOSES ONLY.  IF CONFIRMATION IS NEEDED FOR ANY PURPOSE, NOTIFY LAB WITHIN 5 DAYS.        LOWEST DETECTABLE LIMITS FOR URINE DRUG SCREEN Drug Class       Cutoff (ng/mL) Amphetamine      1000 Barbiturate      200 Benzodiazepine   161 Tricyclics       096 Opiates          300 Cocaine          300 THC              50   Urine microscopic-add on      Status: Abnormal   Collection Time: 11/15/15  6:05 AM  Result Value Ref Range   Squamous Epithelial / LPF  0-5 (A) NONE SEEN   WBC, UA 0-5 0 - 5 WBC/hpf   RBC / HPF 0-5 0 - 5 RBC/hpf   Bacteria, UA RARE (A) NONE SEEN  TSH     Status: None   Collection Time: 11/15/15 12:18 PM  Result Value Ref Range   TSH 1.908 0.350 - 4.500 uIU/mL    Comment: Performed by a 3rd Generation assay with a functional sensitivity of <=0.01 uIU/mL.    Blood Alcohol level:  Lab Results  Component Value Date   ETH <5 11/15/2015    Physical Findings: AIMS:  , ,  ,  ,    CIWA:    COWS:     Musculoskeletal: Strength & Muscle Tone: within normal limits Gait & Station: unsteady Patient leans: Front  Psychiatric Specialty Exam: Physical Exam  Psychiatric: His behavior is normal. Thought content normal. His mood appears anxious. His speech is delayed. Cognition and memory are impaired. He expresses impulsivity.    Review of Systems  Constitutional: Negative.   HENT: Negative.   Eyes: Negative.   Respiratory: Negative.   Cardiovascular: Negative.   Genitourinary: Negative.   Skin: Negative.   Psychiatric/Behavioral: Positive for memory loss. The patient is nervous/anxious.     Blood pressure 128/78, pulse 68, temperature 97.8 F (36.6 C), temperature source Axillary, resp. rate 16, SpO2 98 %.There is no height or weight on file to calculate BMI.  General Appearance: Casual  Eye Contact:  Minimal  Speech:  Clear and Coherent  Volume:  Normal  Mood:  Anxious  Affect:  Appropriate  Thought Process:  Disorganized  Orientation:  Other:  only to person  Thought Content:  Illogical  Suicidal Thoughts:  No  Homicidal Thoughts:  No  Memory:  Immediate;   Poor Recent;   Poor Remote;   Poor  Judgement:  Poor  Insight:  Lacking  Psychomotor Activity:  Psychomotor Retardation  Concentration:  Concentration: Fair and Attention Span: Fair  Recall:  Poor  Fund of Knowledge:  Poor  Language:  Good   Akathisia:  No  Handed:  Right  AIMS (if indicated):     Assets:  Agricultural consultant Social Support  ADL's:  Impaired  Cognition:  Impaired,  Moderate  Sleep:   fair      Treatment Plan Summary: Awaiting transfer to Geropsychiatric inpatient for stabilization Daily contact with patient to assess and evaluate symptoms and progress in treatment, Medication management  Continue Celexa 10 mg daily for agitation. Continue Zyprexa 5 mg qhs for aggression  Corena Pilgrim, MD 11/16/2015, 11:31 AM

## 2015-11-16 NOTE — Progress Notes (Signed)
10:45am- Call received from [place] with [facility] who states he will review patients information and call back.  Genice Rouge [id number] ED CSW 11/16/2015 1:50 PM

## 2015-11-16 NOTE — Progress Notes (Signed)
7:22am- Call received from [facility] to see if patient still needed a bed offer. CSW informed will speak with staff in ED to see if placement is still needed. CSW given number to call Cedric at [phone].   Attempted calls to [place] at [facility], however, unable to leave a message. Will continue to call for follow-up.  Genice Rouge [id number] ED CSW 11/16/2015 8:28 AM

## 2015-11-16 NOTE — Progress Notes (Signed)
Discussed at [place] Recommendations: To return to [facility] and have them to assist with placement Discussed in [facility] progression --MD states pt has POA daughter and Medical POA and goal is to continue to look for geripsych placement but if not d/c back to [facility] 11/17/15

## 2015-11-16 NOTE — ED Notes (Signed)
Dr. Lita Mains notified that patient needs ultrasound of aneurysm to determine placement at [facility].

## 2015-11-29 ENCOUNTER — Emergency Department (HOSPITAL_COMMUNITY)
Admission: EM | Admit: 2015-11-29 | Discharge: 2015-11-30 | Disposition: A | Discharge status: Home / Self Care | Payer: Medicare Other | Attending: Emergency Medicine | Admitting: Emergency Medicine

## 2015-11-29 ENCOUNTER — Encounter (HOSPITAL_COMMUNITY): Payer: Self-pay

## 2015-11-29 DIAGNOSIS — Z951 Presence of aortocoronary bypass graft: Secondary | ICD-10-CM | POA: Insufficient documentation

## 2015-11-29 DIAGNOSIS — Z87891 Personal history of nicotine dependence: Secondary | ICD-10-CM | POA: Insufficient documentation

## 2015-11-29 DIAGNOSIS — R4689 Other symptoms and signs involving appearance and behavior: Secondary | ICD-10-CM

## 2015-11-29 DIAGNOSIS — F919 Conduct disorder, unspecified: Secondary | ICD-10-CM | POA: Insufficient documentation

## 2015-11-29 DIAGNOSIS — Z85818 Personal history of malignant neoplasm of other sites of lip, oral cavity, and pharynx: Secondary | ICD-10-CM | POA: Insufficient documentation

## 2015-11-29 DIAGNOSIS — I2510 Atherosclerotic heart disease of native coronary artery without angina pectoris: Secondary | ICD-10-CM | POA: Insufficient documentation

## 2015-11-29 DIAGNOSIS — R456 Violent behavior: Secondary | ICD-10-CM | POA: Diagnosis not present

## 2015-11-29 DIAGNOSIS — Z7982 Long term (current) use of aspirin: Secondary | ICD-10-CM | POA: Insufficient documentation

## 2015-11-29 DIAGNOSIS — I129 Hypertensive chronic kidney disease with stage 1 through stage 4 chronic kidney disease, or unspecified chronic kidney disease: Secondary | ICD-10-CM | POA: Insufficient documentation

## 2015-11-29 DIAGNOSIS — N183 Chronic kidney disease, stage 3 (moderate): Secondary | ICD-10-CM | POA: Insufficient documentation

## 2015-11-29 MED ORDER — LORAZEPAM 1 MG PO TABS: 0.5000 mg | ORAL_TABLET | Freq: Two times a day (BID) | ORAL | 0 refills | Status: AC | PRN

## 2015-11-29 NOTE — ED Provider Notes (Signed)
[facility] DEPT Provider Note   CSN: [id number] Arrival date & time: 11/29/15  1809   LEVEL 5 CAVEAT - DEMENTIA  History   Chief Complaint Chief Complaint  Patient presents with  . Aggressive Behavior    HPI BOSSIE GIAMBRA is a 82 y.o. male.  HPI  82 year old male with a history of vascular dementia presents from his living facility, [facility], with aggressive behavior. History is taken from the staff at the facility who states that he got out of the [facility] psychiatric unit today. 2 hours after being back in his facility he started being aggressive. He was Insurance underwriter. He walked outside through the exit door causing the alarm to go off. He was hard to redirect. They told EMS that his Ativan was changed to Depakote. They have nothing to give him for acute agitation according to the staff. The patient is currently sitting in his room eating dinner and has no acute complaints. He is pleasantly confused which appears to be his baseline.  Past Medical History:  Diagnosis Date  . Abdominal aortic aneurysm ([place])    a. 07/2014 Abd U/S: 3.61 cm, RCIA >50d.  Marland Kitchen Aphthous ulcer   . BPH (benign prostatic hypertrophy)   . CAD (coronary artery disease)    a. s/p CABG 1993;  b. cath 10/10: severe native 3v CAD, S-Dx ok, S-OM1/OM2 ok, S-PDA ok, L-LAD ok, EF 60%.  . Carotid stenosis    a. s/p Left CEA, followed by Dr. Donnetta Hutching;  b. 07/2014 u/s: RICA < 20, LICA patent.  . Cervical spondylosis without myelopathy   . CKD (chronic kidney disease), stage III   . GERD (gastroesophageal reflux disease)   . History of nephrolithiasis   . HOH (hard of hearing)    hearing aids  . Hyperlipidemia   . Memory deficits 06/19/2013  . Pre-syncope   . Radicular low back pain   . Throat cancer ([place])   . 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 Unspecified adverse effect of unspecified drug, medicinal and biological substance     Patient Active Problem List   Diagnosis Date Noted  .  Vascular dementia with behavior disturbance 11/15/2015  . Palliative care patient 11/15/2015  . UTI (lower urinary tract infection) 09/23/2015  . Acute encephalopathy 09/23/2015  . Depression 10/24/2013  . Postoperative hematoma 10/01/2013  . CKD (chronic kidney disease), stage III 09/28/2013  . Right inguinal hernia 09/26/2013  . AAA (abdominal aortic aneurysm) without rupture ([place]) 07/22/2013  . Cervical spondylosis without myelopathy 06/19/2013  . Memory deficits 06/19/2013  . PVD- hx CEA,patent carotids by doppler July 2015 09/26/2010  . 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 Essential hypertension 03/16/2009  . Low back pain potentially associated with radiculopathy 03/19/2008  . BPH (benign prostatic hyperplasia) 11/25/2007  . GERD 10/24/2006  . Hyperlipidemia 09/14/2006  . CAD (coronary artery disease) s/p bypass graft 09/14/2006  . NEPHROLITHIASIS, HX OF 09/14/2006    Past Surgical History:  Procedure Laterality Date  . BACK SURGERY    . CARDIAC CATHETERIZATION    . CAROTID ENDARTERECTOMY Left   . CHOLECYSTECTOMY    . CORONARY ARTERY BYPASS GRAFT    . CORONARY ARTERY BYPASS GRAFT  1998  . HERNIA REPAIR     unclear which side  . INGUINAL HERNIA REPAIR Right 09/29/2013   Procedure: HERNIA REPAIR INGUINAL ADULT;  Surgeon: Rolm Bookbinder, MD;  Location: [facility];  Service: General;  Laterality: Right;  . INSERTION OF MESH Right 09/29/2013  Procedure: INSERTION OF MESH;  Surgeon: Rolm Bookbinder, MD;  Location: [facility];  Service: General;  Laterality: Right;  . PROSTATE SURGERY    . renal stone surgery     3 times  . THROAT SURGERY         Home Medications    Prior to Admission medications   Medication Sig Start Date End Date Taking? Authorizing Provider  acetaminophen (TYLENOL) 500 MG tablet Take 2 tablets (1,000 mg total) by mouth every 8 (eight) hours. 09/29/15   Acquanetta Chain, DO  ALPHAGAN P 0.1 % SOLN Place 1 drop into both eyes at bedtime.  04/24/13    Historical Provider, MD  aspirin 81 MG tablet Take 1 tablet (81 mg total) by mouth daily. Pt takes 1 tablet by mouth on Mon and Fri. 09/29/15   Acquanetta Chain, DO  celecoxib (CELEBREX) 100 MG capsule Take 1 capsule (100 mg total) by mouth 2 (two) times daily. 09/29/15   Acquanetta Chain, DO  dutasteride (AVODART) 0.5 MG capsule Take 1 capsule (0.5 mg total) by mouth daily. 09/29/15   Acquanetta Chain, DO  escitalopram (LEXAPRO) 20 MG tablet TAKE ONE TABLET BY MOUTH ONCE DAILY Patient taking differently: TAKE ONE TABLET (20 mg) BY MOUTH ONCE DAILY 08/30/15   Marin Olp, MD  finasteride (PROSCAR) 5 MG tablet Take 5 mg by mouth daily. 11/12/15   Historical Provider, MD  levothyroxine (SYNTHROID, LEVOTHROID) 50 MCG tablet Take 50 mcg by mouth daily before breakfast.    Historical Provider, MD  LORazepam (ATIVAN) 1 MG tablet Take 0.5-1 tablets (0.5-1 mg total) by mouth 2 (two) times daily as needed (agitation/aggressive behavior). 11/29/15   Sherwood Gambler, MD  OLANZapine (ZYPREXA) 5 MG tablet Take 1 tablet (5 mg total) by mouth at bedtime. 09/29/15   Acquanetta Chain, DO  polyethylene glycol [facility] / GLYCOLAX) packet Take 17 g by mouth 2 (two) times daily. 09/29/15   Acquanetta Chain, DO  Vitamin D, Ergocalciferol, (DRISDOL) 50000 units CAPS capsule Take 50,000 Units by mouth See admin instructions. Patient takes Tuesday and Saturday    Historical Provider, MD    Family History Family History  Problem Relation Age of Onset  . Heart disease Father   . Stroke Father   . AAA (abdominal aortic aneurysm) Sister   . AAA (abdominal aortic aneurysm) Brother   . Stroke Brother   . Coronary artery disease Brother   . Heart disease Brother   . Coronary artery disease      Social History Social History  Substance Use Topics  . Smoking status: Former Smoker    Packs/day: 1.00    Years: 30.00    Types: Cigarettes    Quit date: 01/17/1972  . Smokeless tobacco: Current User    Types:  Chew  . Alcohol use No     Allergies   Avinza [morphine sulfate]; Doxycycline calcium; Vibramycin [doxycycline hyclate]; Flomax [tamsulosin hcl]; Statins; Codeine sulfate; and Oxycodone   Review of Systems Review of Systems  Unable to perform ROS: Dementia     Physical Exam Updated Vital Signs BP 162/84 (BP Location: Left Arm)   Pulse (!) 57   Temp 97.9 F (36.6 C) (Oral)   Resp 16   SpO2 96%   Physical Exam  Constitutional: He appears well-developed and well-nourished. No distress.  HENT:  Head: Normocephalic and atraumatic.  Right Ear: External ear normal.  Left Ear: External ear normal.  Nose: Nose normal.  Eyes: Right eye exhibits  no discharge. Left eye exhibits no discharge.  Neck: Neck supple.  Cardiovascular: Normal rate, regular rhythm and normal heart sounds.   Pulmonary/Chest: Effort normal and breath sounds normal.  Abdominal: He exhibits no distension.  Musculoskeletal: He exhibits no edema.  Neurological: He is alert. He is disoriented.  Skin: Skin is warm and dry. He is not diaphoretic.  Nursing note and vitals reviewed.    ED Treatments / Results  Labs (all labs ordered are listed, but only abnormal results are displayed) Labs Reviewed - No data to display  EKG  EKG Interpretation None       Radiology No results found.  Procedures Procedures (including critical care time)  Medications Ordered in ED Medications - No data to display   Initial Impression / Assessment and Plan / ED Course  I have reviewed the triage vital signs and the nursing notes.  Pertinent labs & imaging results that were available during my care of the patient were reviewed by me and considered in my medical decision making (see chart for details).  Clinical Course as of Nov 29 2303  Mon Nov 29, 2015  1904 Currently calm, no acute distress. Will get [facility] discharge summary.  [SG]    Clinical Course User Index [SG] Sherwood Gambler, MD    Patient has  been calm and has not shown aggressive behavior while in the ED. He was recently admitted for the same. I discussed with his daughter, the key, who has talked to care at house and they're willing to take him back. I discussed with the nurse manager, Dedra Skeens, who is okay with taking them back and has actually arranged for a male sitter with the patient. However she feels they need something for acute outbursts. He has responded well to oral Ativan in the past. Will provide a short prescription of this and he needs to follow-up with his PCP. His vital signs are unremarkable and I do not feel acute lab work is indicated in this scenario as this is a subacute problem.  Final Clinical Impressions(s) / ED Diagnoses   Final diagnoses:  Aggressive behavior    New Prescriptions New Prescriptions   LORAZEPAM (ATIVAN) 1 MG TABLET    Take 0.5-1 tablets (0.5-1 mg total) by mouth 2 (two) times daily as needed (agitation/aggressive behavior).     Sherwood Gambler, MD 11/29/15 [phone]

## 2015-11-29 NOTE — ED Notes (Signed)
Bed: [id number] Expected date:  Expected time:  Means of arrival:  Comments: EMS- elderly, aggressive behavior

## 2015-11-29 NOTE — ED Notes (Signed)
Pt wandered out of room.  Pt redirected back to room.

## 2015-11-29 NOTE — ED Notes (Signed)
Attempted to call report to facility x1 with no success.

## 2015-11-29 NOTE — ED Triage Notes (Signed)
Per EMS, Pt, from [facility], presents d/t aggressive behavior.  Pt was discharged from [facility] today.  EMS reports that Pt was taken off Ativan and prescribed Depakote.  Sts "they said that they don't have any PRN medications that they can give him."  Pt currently calm and cooperative.  However, facility reports that he is extremely hard to redirect when he becomes agitated.

## 2015-11-29 NOTE — Discharge Instructions (Signed)
Follow-up with your doctor in the next couple days for adjustment of your medicines. Use the Ativan as needed up to 2 times per day for agitation/aggressive behavior.

## 2015-11-29 NOTE — ED Notes (Signed)
Consulted to [facility] for transportation

## 2015-11-29 NOTE — ED Notes (Signed)
Gave report to Mardene Celeste at [facility]

## 2015-11-29 NOTE — ED Notes (Signed)
Attempted to call report to facility x2 with no success

## 2015-11-30 DIAGNOSIS — R4182 Altered mental status, unspecified: Secondary | ICD-10-CM | POA: Diagnosis not present

## 2015-11-30 DIAGNOSIS — F919 Conduct disorder, unspecified: Secondary | ICD-10-CM | POA: Diagnosis not present

## 2015-12-01 DIAGNOSIS — N400 Enlarged prostate without lower urinary tract symptoms: Secondary | ICD-10-CM | POA: Diagnosis not present

## 2015-12-01 DIAGNOSIS — R269 Unspecified abnormalities of gait and mobility: Secondary | ICD-10-CM | POA: Diagnosis not present

## 2015-12-01 DIAGNOSIS — F0151 Vascular dementia with behavioral disturbance: Secondary | ICD-10-CM | POA: Diagnosis not present

## 2015-12-01 DIAGNOSIS — N189 Chronic kidney disease, unspecified: Secondary | ICD-10-CM | POA: Diagnosis not present

## 2015-12-01 DIAGNOSIS — I714 Abdominal aortic aneurysm, without rupture: Secondary | ICD-10-CM | POA: Diagnosis not present

## 2015-12-01 DIAGNOSIS — I2510 Atherosclerotic heart disease of native coronary artery without angina pectoris: Secondary | ICD-10-CM | POA: Diagnosis not present

## 2015-12-03 NOTE — Addendum Note (Signed)
Addended by: Lianne Cure A on: 12/03/2015 03:49 PM   Modules accepted: Orders

## 2015-12-08 DIAGNOSIS — G4700 Insomnia, unspecified: Secondary | ICD-10-CM | POA: Diagnosis not present

## 2015-12-08 DIAGNOSIS — F329 Major depressive disorder, single episode, unspecified: Secondary | ICD-10-CM | POA: Diagnosis not present

## 2015-12-08 DIAGNOSIS — F419 Anxiety disorder, unspecified: Secondary | ICD-10-CM | POA: Diagnosis not present

## 2015-12-08 DIAGNOSIS — F0391 Unspecified dementia with behavioral disturbance: Secondary | ICD-10-CM | POA: Diagnosis not present

## 2015-12-09 DIAGNOSIS — N183 Chronic kidney disease, stage 3 (moderate): Secondary | ICD-10-CM | POA: Diagnosis not present

## 2015-12-09 DIAGNOSIS — F0390 Unspecified dementia without behavioral disturbance: Secondary | ICD-10-CM | POA: Diagnosis not present

## 2015-12-09 DIAGNOSIS — Z9181 History of falling: Secondary | ICD-10-CM | POA: Diagnosis not present

## 2015-12-09 DIAGNOSIS — I739 Peripheral vascular disease, unspecified: Secondary | ICD-10-CM | POA: Diagnosis not present

## 2015-12-09 DIAGNOSIS — I129 Hypertensive chronic kidney disease with stage 1 through stage 4 chronic kidney disease, or unspecified chronic kidney disease: Secondary | ICD-10-CM | POA: Diagnosis not present

## 2015-12-09 DIAGNOSIS — I2510 Atherosclerotic heart disease of native coronary artery without angina pectoris: Secondary | ICD-10-CM | POA: Diagnosis not present

## 2015-12-13 DIAGNOSIS — I129 Hypertensive chronic kidney disease with stage 1 through stage 4 chronic kidney disease, or unspecified chronic kidney disease: Secondary | ICD-10-CM | POA: Diagnosis not present

## 2015-12-13 DIAGNOSIS — N183 Chronic kidney disease, stage 3 (moderate): Secondary | ICD-10-CM | POA: Diagnosis not present

## 2015-12-13 DIAGNOSIS — I739 Peripheral vascular disease, unspecified: Secondary | ICD-10-CM | POA: Diagnosis not present

## 2015-12-13 DIAGNOSIS — F0390 Unspecified dementia without behavioral disturbance: Secondary | ICD-10-CM | POA: Diagnosis not present

## 2015-12-13 DIAGNOSIS — Z9181 History of falling: Secondary | ICD-10-CM | POA: Diagnosis not present

## 2015-12-13 DIAGNOSIS — I2510 Atherosclerotic heart disease of native coronary artery without angina pectoris: Secondary | ICD-10-CM | POA: Diagnosis not present

## 2015-12-15 DIAGNOSIS — I2510 Atherosclerotic heart disease of native coronary artery without angina pectoris: Secondary | ICD-10-CM | POA: Diagnosis not present

## 2015-12-15 DIAGNOSIS — F0390 Unspecified dementia without behavioral disturbance: Secondary | ICD-10-CM | POA: Diagnosis not present

## 2015-12-15 DIAGNOSIS — I129 Hypertensive chronic kidney disease with stage 1 through stage 4 chronic kidney disease, or unspecified chronic kidney disease: Secondary | ICD-10-CM | POA: Diagnosis not present

## 2015-12-15 DIAGNOSIS — I739 Peripheral vascular disease, unspecified: Secondary | ICD-10-CM | POA: Diagnosis not present

## 2015-12-15 DIAGNOSIS — Z9181 History of falling: Secondary | ICD-10-CM | POA: Diagnosis not present

## 2015-12-15 DIAGNOSIS — N183 Chronic kidney disease, stage 3 (moderate): Secondary | ICD-10-CM | POA: Diagnosis not present

## 2015-12-15 DIAGNOSIS — R404 Transient alteration of awareness: Secondary | ICD-10-CM | POA: Diagnosis not present

## 2015-12-16 DIAGNOSIS — I739 Peripheral vascular disease, unspecified: Secondary | ICD-10-CM | POA: Diagnosis not present

## 2015-12-16 DIAGNOSIS — I129 Hypertensive chronic kidney disease with stage 1 through stage 4 chronic kidney disease, or unspecified chronic kidney disease: Secondary | ICD-10-CM | POA: Diagnosis not present

## 2015-12-16 DIAGNOSIS — I2510 Atherosclerotic heart disease of native coronary artery without angina pectoris: Secondary | ICD-10-CM | POA: Diagnosis not present

## 2015-12-16 DIAGNOSIS — Z9181 History of falling: Secondary | ICD-10-CM | POA: Diagnosis not present

## 2015-12-16 DIAGNOSIS — F0390 Unspecified dementia without behavioral disturbance: Secondary | ICD-10-CM | POA: Diagnosis not present

## 2015-12-16 DIAGNOSIS — N183 Chronic kidney disease, stage 3 (moderate): Secondary | ICD-10-CM | POA: Diagnosis not present

## 2015-12-21 DIAGNOSIS — F0390 Unspecified dementia without behavioral disturbance: Secondary | ICD-10-CM | POA: Diagnosis not present

## 2015-12-21 DIAGNOSIS — N183 Chronic kidney disease, stage 3 (moderate): Secondary | ICD-10-CM | POA: Diagnosis not present

## 2015-12-21 DIAGNOSIS — I739 Peripheral vascular disease, unspecified: Secondary | ICD-10-CM | POA: Diagnosis not present

## 2015-12-21 DIAGNOSIS — I2510 Atherosclerotic heart disease of native coronary artery without angina pectoris: Secondary | ICD-10-CM | POA: Diagnosis not present

## 2015-12-21 DIAGNOSIS — Z9181 History of falling: Secondary | ICD-10-CM | POA: Diagnosis not present

## 2015-12-21 DIAGNOSIS — I129 Hypertensive chronic kidney disease with stage 1 through stage 4 chronic kidney disease, or unspecified chronic kidney disease: Secondary | ICD-10-CM | POA: Diagnosis not present

## 2015-12-22 DIAGNOSIS — G4700 Insomnia, unspecified: Secondary | ICD-10-CM | POA: Diagnosis not present

## 2015-12-22 DIAGNOSIS — I2510 Atherosclerotic heart disease of native coronary artery without angina pectoris: Secondary | ICD-10-CM | POA: Diagnosis not present

## 2015-12-22 DIAGNOSIS — Z9181 History of falling: Secondary | ICD-10-CM | POA: Diagnosis not present

## 2015-12-22 DIAGNOSIS — N183 Chronic kidney disease, stage 3 (moderate): Secondary | ICD-10-CM | POA: Diagnosis not present

## 2015-12-22 DIAGNOSIS — F419 Anxiety disorder, unspecified: Secondary | ICD-10-CM | POA: Diagnosis not present

## 2015-12-22 DIAGNOSIS — F329 Major depressive disorder, single episode, unspecified: Secondary | ICD-10-CM | POA: Diagnosis not present

## 2015-12-22 DIAGNOSIS — F0391 Unspecified dementia with behavioral disturbance: Secondary | ICD-10-CM | POA: Diagnosis not present

## 2015-12-22 DIAGNOSIS — F0390 Unspecified dementia without behavioral disturbance: Secondary | ICD-10-CM | POA: Diagnosis not present

## 2015-12-22 DIAGNOSIS — I129 Hypertensive chronic kidney disease with stage 1 through stage 4 chronic kidney disease, or unspecified chronic kidney disease: Secondary | ICD-10-CM | POA: Diagnosis not present

## 2015-12-22 DIAGNOSIS — I739 Peripheral vascular disease, unspecified: Secondary | ICD-10-CM | POA: Diagnosis not present

## 2015-12-23 DIAGNOSIS — N183 Chronic kidney disease, stage 3 (moderate): Secondary | ICD-10-CM | POA: Diagnosis not present

## 2015-12-23 DIAGNOSIS — I2510 Atherosclerotic heart disease of native coronary artery without angina pectoris: Secondary | ICD-10-CM | POA: Diagnosis not present

## 2015-12-23 DIAGNOSIS — I129 Hypertensive chronic kidney disease with stage 1 through stage 4 chronic kidney disease, or unspecified chronic kidney disease: Secondary | ICD-10-CM | POA: Diagnosis not present

## 2015-12-23 DIAGNOSIS — F0390 Unspecified dementia without behavioral disturbance: Secondary | ICD-10-CM | POA: Diagnosis not present

## 2015-12-23 DIAGNOSIS — I739 Peripheral vascular disease, unspecified: Secondary | ICD-10-CM | POA: Diagnosis not present

## 2015-12-23 DIAGNOSIS — Z9181 History of falling: Secondary | ICD-10-CM | POA: Diagnosis not present

## 2015-12-24 DIAGNOSIS — F0390 Unspecified dementia without behavioral disturbance: Secondary | ICD-10-CM | POA: Diagnosis not present

## 2015-12-24 DIAGNOSIS — I739 Peripheral vascular disease, unspecified: Secondary | ICD-10-CM | POA: Diagnosis not present

## 2015-12-24 DIAGNOSIS — Z9181 History of falling: Secondary | ICD-10-CM | POA: Diagnosis not present

## 2015-12-24 DIAGNOSIS — I129 Hypertensive chronic kidney disease with stage 1 through stage 4 chronic kidney disease, or unspecified chronic kidney disease: Secondary | ICD-10-CM | POA: Diagnosis not present

## 2015-12-24 DIAGNOSIS — N183 Chronic kidney disease, stage 3 (moderate): Secondary | ICD-10-CM | POA: Diagnosis not present

## 2015-12-24 DIAGNOSIS — I2510 Atherosclerotic heart disease of native coronary artery without angina pectoris: Secondary | ICD-10-CM | POA: Diagnosis not present

## 2015-12-27 DIAGNOSIS — I129 Hypertensive chronic kidney disease with stage 1 through stage 4 chronic kidney disease, or unspecified chronic kidney disease: Secondary | ICD-10-CM | POA: Diagnosis not present

## 2015-12-27 DIAGNOSIS — I2510 Atherosclerotic heart disease of native coronary artery without angina pectoris: Secondary | ICD-10-CM | POA: Diagnosis not present

## 2015-12-27 DIAGNOSIS — I739 Peripheral vascular disease, unspecified: Secondary | ICD-10-CM | POA: Diagnosis not present

## 2015-12-27 DIAGNOSIS — R2681 Unsteadiness on feet: Secondary | ICD-10-CM | POA: Diagnosis not present

## 2015-12-27 DIAGNOSIS — F0391 Unspecified dementia with behavioral disturbance: Secondary | ICD-10-CM | POA: Diagnosis not present

## 2015-12-27 DIAGNOSIS — N183 Chronic kidney disease, stage 3 (moderate): Secondary | ICD-10-CM | POA: Diagnosis not present

## 2015-12-29 DIAGNOSIS — F419 Anxiety disorder, unspecified: Secondary | ICD-10-CM | POA: Diagnosis not present

## 2015-12-29 DIAGNOSIS — Z79899 Other long term (current) drug therapy: Secondary | ICD-10-CM | POA: Diagnosis not present

## 2015-12-29 DIAGNOSIS — I10 Essential (primary) hypertension: Secondary | ICD-10-CM | POA: Diagnosis not present

## 2015-12-29 DIAGNOSIS — N183 Chronic kidney disease, stage 3 (moderate): Secondary | ICD-10-CM | POA: Diagnosis not present

## 2015-12-29 DIAGNOSIS — I2510 Atherosclerotic heart disease of native coronary artery without angina pectoris: Secondary | ICD-10-CM | POA: Diagnosis not present

## 2015-12-29 DIAGNOSIS — G4700 Insomnia, unspecified: Secondary | ICD-10-CM | POA: Diagnosis not present

## 2015-12-29 DIAGNOSIS — I129 Hypertensive chronic kidney disease with stage 1 through stage 4 chronic kidney disease, or unspecified chronic kidney disease: Secondary | ICD-10-CM | POA: Diagnosis not present

## 2015-12-29 DIAGNOSIS — F0391 Unspecified dementia with behavioral disturbance: Secondary | ICD-10-CM | POA: Diagnosis not present

## 2015-12-29 DIAGNOSIS — I739 Peripheral vascular disease, unspecified: Secondary | ICD-10-CM | POA: Diagnosis not present

## 2015-12-29 DIAGNOSIS — R2681 Unsteadiness on feet: Secondary | ICD-10-CM | POA: Diagnosis not present

## 2015-12-29 DIAGNOSIS — G309 Alzheimer's disease, unspecified: Secondary | ICD-10-CM | POA: Diagnosis not present

## 2015-12-31 ENCOUNTER — Emergency Department (HOSPITAL_COMMUNITY)
Admission: EM | Admit: 2015-12-31 | Discharge: 2015-12-31 | Disposition: A | Discharge status: Home / Self Care | Payer: Medicare Other | Attending: Emergency Medicine | Admitting: Emergency Medicine

## 2015-12-31 ENCOUNTER — Emergency Department (HOSPITAL_COMMUNITY): Payer: Medicare Other

## 2015-12-31 DIAGNOSIS — I10 Essential (primary) hypertension: Secondary | ICD-10-CM | POA: Diagnosis not present

## 2015-12-31 DIAGNOSIS — Z87891 Personal history of nicotine dependence: Secondary | ICD-10-CM | POA: Insufficient documentation

## 2015-12-31 DIAGNOSIS — I2510 Atherosclerotic heart disease of native coronary artery without angina pectoris: Secondary | ICD-10-CM | POA: Insufficient documentation

## 2015-12-31 DIAGNOSIS — S0191XA Laceration without foreign body of unspecified part of head, initial encounter: Secondary | ICD-10-CM | POA: Diagnosis not present

## 2015-12-31 DIAGNOSIS — Y9289 Other specified places as the place of occurrence of the external cause: Secondary | ICD-10-CM | POA: Diagnosis not present

## 2015-12-31 DIAGNOSIS — S0181XA Laceration without foreign body of other part of head, initial encounter: Secondary | ICD-10-CM

## 2015-12-31 DIAGNOSIS — X58XXXA Exposure to other specified factors, initial encounter: Secondary | ICD-10-CM | POA: Diagnosis not present

## 2015-12-31 DIAGNOSIS — S199XXA Unspecified injury of neck, initial encounter: Secondary | ICD-10-CM | POA: Diagnosis not present

## 2015-12-31 DIAGNOSIS — Z7982 Long term (current) use of aspirin: Secondary | ICD-10-CM | POA: Diagnosis not present

## 2015-12-31 DIAGNOSIS — Y939 Activity, unspecified: Secondary | ICD-10-CM | POA: Diagnosis not present

## 2015-12-31 DIAGNOSIS — Y999 Unspecified external cause status: Secondary | ICD-10-CM | POA: Diagnosis not present

## 2015-12-31 DIAGNOSIS — S0083XA Contusion of other part of head, initial encounter: Secondary | ICD-10-CM | POA: Diagnosis not present

## 2015-12-31 DIAGNOSIS — S0990XA Unspecified injury of head, initial encounter: Secondary | ICD-10-CM | POA: Diagnosis not present

## 2015-12-31 DIAGNOSIS — Z85818 Personal history of malignant neoplasm of other sites of lip, oral cavity, and pharynx: Secondary | ICD-10-CM | POA: Diagnosis not present

## 2015-12-31 DIAGNOSIS — N183 Chronic kidney disease, stage 3 (moderate): Secondary | ICD-10-CM | POA: Insufficient documentation

## 2015-12-31 DIAGNOSIS — F0390 Unspecified dementia without behavioral disturbance: Secondary | ICD-10-CM | POA: Insufficient documentation

## 2015-12-31 DIAGNOSIS — Z951 Presence of aortocoronary bypass graft: Secondary | ICD-10-CM | POA: Diagnosis not present

## 2015-12-31 DIAGNOSIS — S098XXA Other specified injuries of head, initial encounter: Secondary | ICD-10-CM | POA: Diagnosis not present

## 2015-12-31 DIAGNOSIS — I129 Hypertensive chronic kidney disease with stage 1 through stage 4 chronic kidney disease, or unspecified chronic kidney disease: Secondary | ICD-10-CM | POA: Diagnosis not present

## 2015-12-31 DIAGNOSIS — S0993XA Unspecified injury of face, initial encounter: Secondary | ICD-10-CM | POA: Diagnosis not present

## 2015-12-31 DIAGNOSIS — R259 Unspecified abnormal involuntary movements: Secondary | ICD-10-CM | POA: Diagnosis not present

## 2015-12-31 DIAGNOSIS — Z23 Encounter for immunization: Secondary | ICD-10-CM | POA: Diagnosis not present

## 2015-12-31 DIAGNOSIS — Z8673 Personal history of transient ischemic attack (TIA), and cerebral infarction without residual deficits: Secondary | ICD-10-CM | POA: Diagnosis not present

## 2015-12-31 DIAGNOSIS — S01111A Laceration without foreign body of right eyelid and periocular area, initial encounter: Secondary | ICD-10-CM | POA: Insufficient documentation

## 2015-12-31 DIAGNOSIS — S0010XA Contusion of unspecified eyelid and periocular area, initial encounter: Secondary | ICD-10-CM | POA: Diagnosis not present

## 2015-12-31 LAB — CBC
HEMATOCRIT: 49.2 % (ref 39.0–52.0)
Hemoglobin: 16.3 g/dL (ref 13.0–17.0)
MCH: 31.8 pg (ref 26.0–34.0)
MCHC: 33.1 g/dL (ref 30.0–36.0)
MCV: 95.9 fL (ref 78.0–100.0)
Platelets: 131 10*3/uL — ABNORMAL LOW (ref 150–400)
RBC: 5.13 MIL/uL (ref 4.22–5.81)
RDW: 13.4 % (ref 11.5–15.5)
WBC: 5 10*3/uL (ref 4.0–10.5)

## 2015-12-31 MED ORDER — LIDOCAINE-EPINEPHRINE (PF) 2 %-1:200000 IJ SOLN
10.0000 mL | Freq: Once | INTRAMUSCULAR | Status: AC
  Administered 2015-12-31: 10 mL via INTRADERMAL
  Filled 2015-12-31: qty 20

## 2015-12-31 MED ORDER — TETANUS-DIPHTH-ACELL PERTUSSIS 5-2.5-18.5 LF-MCG/0.5 IM SUSP
0.5000 mL | Freq: Once | INTRAMUSCULAR | Status: AC
  Administered 2015-12-31: 0.5 mL via INTRAMUSCULAR
  Filled 2015-12-31: qty 0.5

## 2015-12-31 MED ORDER — BACITRACIN 500 UNIT/GM EX OINT: 1.0000 "application " | TOPICAL_OINTMENT | Freq: Two times a day (BID) | CUTANEOUS | 0 refills | Status: DC

## 2015-12-31 MED ORDER — CEPHALEXIN 500 MG PO CAPS
500.0000 mg | ORAL_CAPSULE | Freq: Three times a day (TID) | ORAL | 0 refills | Status: AC
Start: 2015-12-31 — End: 2016-01-05

## 2015-12-31 NOTE — Progress Notes (Signed)
Patient from [facility] Unit. CSW now following for disposition and return to facility when medically appropriate and stable for discharge.          Lorrine Kin, MSW, LCSW [facility] ED/56M Clinical Social Worker [phone]

## 2015-12-31 NOTE — ED Triage Notes (Signed)
Patient found on the ground at [facility] memory unit. He has a R facial lac above the R eyebrow approx. 2 in. Long. Pt. Baseline is alert to his name and yes and no questions. Bleeding controlled and head wrapped on arrival

## 2015-12-31 NOTE — ED Notes (Signed)
Patient head wrapped with a pressure dressing and cleaned up the dried blood. Patient became somewhat combative and ripped out his IV iin his L forearm. Helped into a chair and his shirt put on  [place] EMT assisted

## 2015-12-31 NOTE — ED Notes (Signed)
Suture cart at bedside 

## 2015-12-31 NOTE — ED Notes (Signed)
Patient has been combative and hard to contain. He wanted up in a chair but keeps trying to run away. [facility] is here to take patient home

## 2015-12-31 NOTE — ED Provider Notes (Signed)
[facility] DEPT Provider Note   CSN: [id number] Arrival date & time: 12/31/15  [id number]     History   Chief Complaint Chief Complaint  Patient presents with  . Fall  . Facial Laceration    HPI [name] is a 82 y.o. male.  HPI  82 year old male with extensive past medical history as below here with fall and facial laceration. History is limited due to dementia. According to the nursing facility, the patient was in his room alone when they heard a loud crashing noise. They found him on the ground with a large, bleeding laceration to his head. The laceration bled through several a BD pads while en route. According EMS, vital signs have been stable. According to SNF report to the nursing facility, patient is at his baseline state of health. He has a history of falls. No blood thinner use.  Level 5 caveat invoked as remainder of history, ROS, and physical exam limited due to patient's severe dementia.    Past Medical History:  Diagnosis Date  . Abdominal aortic aneurysm ([place])    a. 07/2014 Abd U/S: 3.61 cm, RCIA >50d.  Marland Kitchen Aphthous ulcer   . BPH (benign prostatic hypertrophy)   . CAD (coronary artery disease)    a. s/p CABG 1993;  b. cath 10/10: severe native 3v CAD, S-Dx ok, S-OM1/OM2 ok, S-PDA ok, L-LAD ok, EF 60%.  . Carotid stenosis    a. s/p Left CEA, followed by Dr. Donnetta Hutching;  b. 07/2014 u/s: RICA < 20, LICA patent.  . Cervical spondylosis without myelopathy   . CKD (chronic kidney disease), stage III   . GERD (gastroesophageal reflux disease)   . History of nephrolithiasis   . HOH (hard of hearing)    hearing aids  . Hyperlipidemia   . Memory deficits 06/19/2013  . Pre-syncope   . Radicular low back pain   . Throat cancer ([place])   . 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 Unspecified adverse effect of unspecified drug, medicinal and biological substance     Patient Active Problem List   Diagnosis Date Noted  . Vascular dementia with behavior disturbance  11/15/2015  . Palliative care patient 11/15/2015  . UTI (lower urinary tract infection) 09/23/2015  . Acute encephalopathy 09/23/2015  . Depression 10/24/2013  . Postoperative hematoma 10/01/2013  . CKD (chronic kidney disease), stage III 09/28/2013  . Right inguinal hernia 09/26/2013  . AAA (abdominal aortic aneurysm) without rupture ([place]) 07/22/2013  . Cervical spondylosis without myelopathy 06/19/2013  . Memory deficits 06/19/2013  . PVD- hx CEA,patent carotids by doppler July 2015 09/26/2010  . 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 Essential hypertension 03/16/2009  . Low back pain potentially associated with radiculopathy 03/19/2008  . BPH (benign prostatic hyperplasia) 11/25/2007  . GERD 10/24/2006  . Hyperlipidemia 09/14/2006  . CAD (coronary artery disease) s/p bypass graft 09/14/2006  . NEPHROLITHIASIS, HX OF 09/14/2006    Past Surgical History:  Procedure Laterality Date  . BACK SURGERY    . CARDIAC CATHETERIZATION    . CAROTID ENDARTERECTOMY Left   . CHOLECYSTECTOMY    . CORONARY ARTERY BYPASS GRAFT    . CORONARY ARTERY BYPASS GRAFT  1998  . HERNIA REPAIR     unclear which side  . INGUINAL HERNIA REPAIR Right 09/29/2013   Procedure: HERNIA REPAIR INGUINAL ADULT;  Surgeon: Rolm Bookbinder, MD;  Location: [facility];  Service: General;  Laterality: Right;  . INSERTION OF MESH Right 09/29/2013   Procedure:  INSERTION OF MESH;  Surgeon: Rolm Bookbinder, MD;  Location: [facility];  Service: General;  Laterality: Right;  . PROSTATE SURGERY    . renal stone surgery     3 times  . THROAT SURGERY         Home Medications    Prior to Admission medications   Medication Sig Start Date End Date Taking? Authorizing Provider  acetaminophen (TYLENOL) 500 MG tablet Take 2 tablets (1,000 mg total) by mouth every 8 (eight) hours. 09/29/15   Acquanetta Chain, DO  ALPHAGAN P 0.1 % SOLN Place 1 drop into both eyes at bedtime.  04/24/13   Historical Provider, MD  aspirin 81 MG  tablet Take 1 tablet (81 mg total) by mouth daily. Pt takes 1 tablet by mouth on Mon and Fri. 09/29/15   Acquanetta Chain, DO  bacitracin 500 UNIT/GM ointment Apply 1 application topically 2 (two) times daily. To forehead laceration until healed 12/31/15   Duffy Bruce, MD  celecoxib (CELEBREX) 100 MG capsule Take 1 capsule (100 mg total) by mouth 2 (two) times daily. 09/29/15   Acquanetta Chain, DO  cephALEXin (KEFLEX) 500 MG capsule Take 1 capsule (500 mg total) by mouth 3 (three) times daily. 12/31/15 01/05/16  Duffy Bruce, MD  dutasteride (AVODART) 0.5 MG capsule Take 1 capsule (0.5 mg total) by mouth daily. 09/29/15   Acquanetta Chain, DO  escitalopram (LEXAPRO) 20 MG tablet TAKE ONE TABLET BY MOUTH ONCE DAILY Patient taking differently: TAKE ONE TABLET (20 mg) BY MOUTH ONCE DAILY 08/30/15   Marin Olp, MD  finasteride (PROSCAR) 5 MG tablet Take 5 mg by mouth daily. 11/12/15   Historical Provider, MD  levothyroxine (SYNTHROID, LEVOTHROID) 50 MCG tablet Take 50 mcg by mouth daily before breakfast.    Historical Provider, MD  LORazepam (ATIVAN) 1 MG tablet Take 0.5-1 tablets (0.5-1 mg total) by mouth 2 (two) times daily as needed (agitation/aggressive behavior). 11/29/15   Sherwood Gambler, MD  OLANZapine (ZYPREXA) 5 MG tablet Take 1 tablet (5 mg total) by mouth at bedtime. 09/29/15   Acquanetta Chain, DO  polyethylene glycol [facility] / GLYCOLAX) packet Take 17 g by mouth 2 (two) times daily. 09/29/15   Acquanetta Chain, DO  Vitamin D, Ergocalciferol, (DRISDOL) 50000 units CAPS capsule Take 50,000 Units by mouth See admin instructions. Patient takes Tuesday and Saturday    Historical Provider, MD    Family History Family History  Problem Relation Age of Onset  . Heart disease Father   . Stroke Father   . AAA (abdominal aortic aneurysm) Sister   . AAA (abdominal aortic aneurysm) Brother   . Stroke Brother   . Coronary artery disease Brother   . Heart disease Brother   .  Coronary artery disease      Social History Social History  Substance Use Topics  . Smoking status: Former Smoker    Packs/day: 1.00    Years: 30.00    Types: Cigarettes    Quit date: 01/17/1972  . Smokeless tobacco: Current User    Types: Chew  . Alcohol use No     Allergies   Avinza [morphine sulfate]; Doxycycline calcium; Vibramycin [doxycycline hyclate]; Flomax [tamsulosin hcl]; Statins; Codeine sulfate; and Oxycodone   Review of Systems Review of Systems  Unable to perform ROS: Dementia     Physical Exam Updated Vital Signs BP 132/87   Pulse (!) 58   Temp 98 F (36.7 C) (Oral)   Resp 12  SpO2 95%   Physical Exam  Constitutional: He appears well-developed and well-nourished. No distress.  HENT:  Head: Normocephalic.  Large approximately 7 cm linear laceration with midline vertical component to right brow, involving orbicularis and frontalis muscles but without violation or extension to galea or skull. Moderate volume of dark red as well as bright red bleeding on exam. Eye normal with PERRL.  Eyes: Conjunctivae are normal.  Neck: Neck supple.  Cardiovascular: Normal rate, regular rhythm and normal heart sounds.  Exam reveals no friction rub.   No murmur heard. Pulmonary/Chest: Effort normal and breath sounds normal. No respiratory distress. He has no wheezes. He has no rales.  Abdominal: He exhibits no distension.  Musculoskeletal: He exhibits no edema.  Neurological: He is alert. He exhibits normal muscle tone.  Oriented to person only. Able to spontaneously move all extremities with at least antigravity strength. Withdraws/responds to touch in b/l UE and LE. Face symmetric. Tongue midline. Speech at baseline (mildly dysarthric 2/2 vocal cord surgery).  Skin: Skin is warm. Capillary refill takes less than 2 seconds.  Psychiatric: He has a normal mood and affect.  Nursing note and vitals reviewed.    ED Treatments / Results  Labs (all labs ordered are  listed, but only abnormal results are displayed) Labs Reviewed  CBC - Abnormal; Notable for the following:       Result Value   Platelets 131 (*)    All other components within normal limits    EKG  EKG Interpretation  Date/Time:  Friday December 31 2015 10:00:08 EST Ventricular Rate:  64 PR Interval:    QRS Duration: 95 QT Interval:  452 QTC Calculation: 467 R Axis:   5 Text Interpretation:  Sinus rhythm No significant change since last tracing Confirmed by Lenon Kuennen MD, Batya Citron [phone]) on 12/31/2015 12:41:02 PM       Radiology Ct Head Wo Contrast  Result Date: 12/31/2015 CLINICAL DATA:  Fall. EXAM: CT HEAD WITHOUT CONTRAST CT CERVICAL SPINE WITHOUT CONTRAST TECHNIQUE: Multidetector CT imaging of the head and cervical spine was performed following the standard protocol without intravenous contrast. Multiplanar CT image reconstructions of the cervical spine were also generated. COMPARISON:  CT scan of November 15, 2015, May 19, 2010 and March 14, 2010. FINDINGS: CT HEAD FINDINGS Brain: Mild diffuse cortical atrophy is noted. Minimal chronic ischemic white matter disease is noted. No mass effect or midline shift is noted. Ventricular size is within normal limits. There is no evidence of mass lesion, hemorrhage or acute infarction. Vascular: Atherosclerosis of carotid siphons is noted. Skull: No fracture or dislocation is noted. Stable lucent lesion is seen in right frontal skull. Sinuses/Orbits: No acute finding. Other: None. CT CERVICAL SPINE FINDINGS Alignment: Normal. Skull base and vertebrae: Status post surgical anterior fusion of C4, C5, C6 and C7. No fracture is noted. Soft tissues and spinal canal: No prevertebral fluid or swelling. No visible canal hematoma. Disc levels: Moderate degenerative disc disease is noted at C3-4 and C7-T1. Status post interbody fusion of C4-5, C5-6 and C6-7. Upper chest: Negative. Other: Hypertrophy of posterior facet joints is noted consistent with mild  degenerative change. IMPRESSION: Mild diffuse cortical atrophy. Minimal chronic ischemic white matter disease. No acute intracranial abnormality seen. Extensive postsurgical and degenerative changes seen in cervical spine. No acute abnormality is noted. Electronically Signed   By: Marijo Conception, M.D.   On: 12/31/2015 12:15   Ct Cervical Spine Wo Contrast  Result Date: 12/31/2015 CLINICAL DATA:  Fall. EXAM: CT HEAD  WITHOUT CONTRAST CT CERVICAL SPINE WITHOUT CONTRAST TECHNIQUE: Multidetector CT imaging of the head and cervical spine was performed following the standard protocol without intravenous contrast. Multiplanar CT image reconstructions of the cervical spine were also generated. COMPARISON:  CT scan of November 15, 2015, May 19, 2010 and March 14, 2010. FINDINGS: CT HEAD FINDINGS Brain: Mild diffuse cortical atrophy is noted. Minimal chronic ischemic white matter disease is noted. No mass effect or midline shift is noted. Ventricular size is within normal limits. There is no evidence of mass lesion, hemorrhage or acute infarction. Vascular: Atherosclerosis of carotid siphons is noted. Skull: No fracture or dislocation is noted. Stable lucent lesion is seen in right frontal skull. Sinuses/Orbits: No acute finding. Other: None. CT CERVICAL SPINE FINDINGS Alignment: Normal. Skull base and vertebrae: Status post surgical anterior fusion of C4, C5, C6 and C7. No fracture is noted. Soft tissues and spinal canal: No prevertebral fluid or swelling. No visible canal hematoma. Disc levels: Moderate degenerative disc disease is noted at C3-4 and C7-T1. Status post interbody fusion of C4-5, C5-6 and C6-7. Upper chest: Negative. Other: Hypertrophy of posterior facet joints is noted consistent with mild degenerative change. IMPRESSION: Mild diffuse cortical atrophy. Minimal chronic ischemic white matter disease. No acute intracranial abnormality seen. Extensive postsurgical and degenerative changes seen in cervical  spine. No acute abnormality is noted. Electronically Signed   By: Marijo Conception, M.D.   On: 12/31/2015 12:15    Procedures .Marland KitchenLaceration Repair Date/Time: 12/31/2015 11:00 AM Performed by: Duffy Bruce Authorized by: Duffy Bruce   Consent:    Consent obtained:  Emergent situation Anesthesia (see MAR for exact dosages):    Anesthesia method:  Local infiltration   Local anesthetic:  Lidocaine 1% WITH epi Laceration details:    Location:  Face   Face location:  R eyebrow   Length (cm):  7   Depth (mm):  5 Repair type:    Repair type:  Complex Pre-procedure details:    Preparation:  Patient was prepped and draped in usual sterile fashion Exploration:    Limited defect created (wound extended): yes     Hemostasis achieved with:  Direct pressure and epinephrine   Wound exploration: wound explored through full range of motion and entire depth of wound probed and visualized     Wound extent: fascia violated and muscle damage     Wound extent: no underlying fracture noted and no vascular damage noted     Contaminated: no   Treatment:    Area cleansed with:  Betadine   Amount of cleaning:  Extensive   Irrigation solution:  Sterile saline   Irrigation volume:  750   Irrigation method:  Pressure wash   Debridement:  Moderate   Undermining:  Extensive Subcutaneous repair:    Suture size:  4-0   Suture material:  Vicryl   Suture technique:  Simple interrupted   Number of sutures:  4 Skin repair:    Repair method:  Sutures   Suture size:  5-0   Suture material:  Prolene   Suture technique:  Simple interrupted   Number of sutures:  8 Approximation:    Approximation:  Close   Vermilion border: well-aligned   Post-procedure details:    Dressing:  Antibiotic ointment and non-adherent dressing   Patient tolerance of procedure:  Tolerated well, no immediate complications    (including critical care time)  Medications Ordered in ED Medications  lidocaine-EPINEPHrine  (XYLOCAINE W/EPI) 2 %-1:200000 (PF) injection 10 mL (10 mLs  Intradermal Given 12/31/15 1003)  Tdap (BOOSTRIX) injection 0.5 mL (0.5 mLs Intramuscular Given 12/31/15 1120)     Initial Impression / Assessment and Plan / ED Course  I have reviewed the triage vital signs and the nursing notes.  Pertinent labs & imaging results that were available during my care of the patient were reviewed by me and considered in my medical decision making (see chart for details).  Clinical Course     82 year old male who presents with large laceration to right brow after mechanical fall. Patient has history of falls and is severely demented at baseline. No blood thinner use. On arrival, laceration closed immediately at bedside due to large volume of bleeding. Following closure, there is no further bleeding and hemoglobin is stable, which is reassuring. Laceration closed in multiple layers and complex fashion as above. CT head and C-spine showed no underlying fracture or evidence of retained foreign body. Will place on prophylactic antibiotics, advised bacitracin to the wound, and suture removal in 5 days. Patient is otherwise hemodynamically stable.  Final Clinical Impressions(s) / ED Diagnoses   Final diagnoses:  Laceration of brow without complication, initial encounter  Contusion of forehead, initial encounter    New Prescriptions New Prescriptions   BACITRACIN 500 UNIT/GM OINTMENT    Apply 1 application topically 2 (two) times daily. To forehead laceration until healed   CEPHALEXIN (KEFLEX) 500 MG CAPSULE    Take 1 capsule (500 mg total) by mouth 3 (three) times daily.     Duffy Bruce, MD 12/31/15 [phone]

## 2016-01-03 DIAGNOSIS — F0391 Unspecified dementia with behavioral disturbance: Secondary | ICD-10-CM | POA: Diagnosis not present

## 2016-01-03 DIAGNOSIS — I2510 Atherosclerotic heart disease of native coronary artery without angina pectoris: Secondary | ICD-10-CM | POA: Diagnosis not present

## 2016-01-03 DIAGNOSIS — I739 Peripheral vascular disease, unspecified: Secondary | ICD-10-CM | POA: Diagnosis not present

## 2016-01-03 DIAGNOSIS — I129 Hypertensive chronic kidney disease with stage 1 through stage 4 chronic kidney disease, or unspecified chronic kidney disease: Secondary | ICD-10-CM | POA: Diagnosis not present

## 2016-01-03 DIAGNOSIS — R2681 Unsteadiness on feet: Secondary | ICD-10-CM | POA: Diagnosis not present

## 2016-01-03 DIAGNOSIS — N183 Chronic kidney disease, stage 3 (moderate): Secondary | ICD-10-CM | POA: Diagnosis not present

## 2016-01-05 DIAGNOSIS — R233 Spontaneous ecchymoses: Secondary | ICD-10-CM | POA: Diagnosis not present

## 2016-01-05 DIAGNOSIS — I2510 Atherosclerotic heart disease of native coronary artery without angina pectoris: Secondary | ICD-10-CM | POA: Diagnosis not present

## 2016-01-05 DIAGNOSIS — I129 Hypertensive chronic kidney disease with stage 1 through stage 4 chronic kidney disease, or unspecified chronic kidney disease: Secondary | ICD-10-CM | POA: Diagnosis not present

## 2016-01-05 DIAGNOSIS — I10 Essential (primary) hypertension: Secondary | ICD-10-CM | POA: Diagnosis not present

## 2016-01-05 DIAGNOSIS — G4700 Insomnia, unspecified: Secondary | ICD-10-CM | POA: Diagnosis not present

## 2016-01-05 DIAGNOSIS — F0391 Unspecified dementia with behavioral disturbance: Secondary | ICD-10-CM | POA: Diagnosis not present

## 2016-01-05 DIAGNOSIS — N183 Chronic kidney disease, stage 3 (moderate): Secondary | ICD-10-CM | POA: Diagnosis not present

## 2016-01-05 DIAGNOSIS — R2681 Unsteadiness on feet: Secondary | ICD-10-CM | POA: Diagnosis not present

## 2016-01-05 DIAGNOSIS — Z79899 Other long term (current) drug therapy: Secondary | ICD-10-CM | POA: Diagnosis not present

## 2016-01-05 DIAGNOSIS — G309 Alzheimer's disease, unspecified: Secondary | ICD-10-CM | POA: Diagnosis not present

## 2016-01-05 DIAGNOSIS — S0181XS Laceration without foreign body of other part of head, sequela: Secondary | ICD-10-CM | POA: Diagnosis not present

## 2016-01-05 DIAGNOSIS — I739 Peripheral vascular disease, unspecified: Secondary | ICD-10-CM | POA: Diagnosis not present

## 2016-01-05 DIAGNOSIS — F419 Anxiety disorder, unspecified: Secondary | ICD-10-CM | POA: Diagnosis not present

## 2016-01-05 DIAGNOSIS — Z9181 History of falling: Secondary | ICD-10-CM | POA: Diagnosis not present

## 2016-01-12 DIAGNOSIS — S0181XD Laceration without foreign body of other part of head, subsequent encounter: Secondary | ICD-10-CM | POA: Diagnosis not present

## 2016-01-12 DIAGNOSIS — I739 Peripheral vascular disease, unspecified: Secondary | ICD-10-CM | POA: Diagnosis not present

## 2016-01-12 DIAGNOSIS — F0391 Unspecified dementia with behavioral disturbance: Secondary | ICD-10-CM | POA: Diagnosis not present

## 2016-01-12 DIAGNOSIS — N183 Chronic kidney disease, stage 3 (moderate): Secondary | ICD-10-CM | POA: Diagnosis not present

## 2016-01-12 DIAGNOSIS — R2681 Unsteadiness on feet: Secondary | ICD-10-CM | POA: Diagnosis not present

## 2016-01-12 DIAGNOSIS — I10 Essential (primary) hypertension: Secondary | ICD-10-CM | POA: Diagnosis not present

## 2016-01-12 DIAGNOSIS — Z79899 Other long term (current) drug therapy: Secondary | ICD-10-CM | POA: Diagnosis not present

## 2016-01-12 DIAGNOSIS — G309 Alzheimer's disease, unspecified: Secondary | ICD-10-CM | POA: Diagnosis not present

## 2016-01-12 DIAGNOSIS — F419 Anxiety disorder, unspecified: Secondary | ICD-10-CM | POA: Diagnosis not present

## 2016-01-12 DIAGNOSIS — I129 Hypertensive chronic kidney disease with stage 1 through stage 4 chronic kidney disease, or unspecified chronic kidney disease: Secondary | ICD-10-CM | POA: Diagnosis not present

## 2016-01-12 DIAGNOSIS — I2510 Atherosclerotic heart disease of native coronary artery without angina pectoris: Secondary | ICD-10-CM | POA: Diagnosis not present

## 2016-01-14 DIAGNOSIS — I739 Peripheral vascular disease, unspecified: Secondary | ICD-10-CM | POA: Diagnosis not present

## 2016-01-14 DIAGNOSIS — I129 Hypertensive chronic kidney disease with stage 1 through stage 4 chronic kidney disease, or unspecified chronic kidney disease: Secondary | ICD-10-CM | POA: Diagnosis not present

## 2016-01-14 DIAGNOSIS — N183 Chronic kidney disease, stage 3 (moderate): Secondary | ICD-10-CM | POA: Diagnosis not present

## 2016-01-14 DIAGNOSIS — I2510 Atherosclerotic heart disease of native coronary artery without angina pectoris: Secondary | ICD-10-CM | POA: Diagnosis not present

## 2016-01-14 DIAGNOSIS — F0391 Unspecified dementia with behavioral disturbance: Secondary | ICD-10-CM | POA: Diagnosis not present

## 2016-01-14 DIAGNOSIS — R2681 Unsteadiness on feet: Secondary | ICD-10-CM | POA: Diagnosis not present

## 2016-01-19 DIAGNOSIS — F0391 Unspecified dementia with behavioral disturbance: Secondary | ICD-10-CM | POA: Diagnosis not present

## 2016-01-19 DIAGNOSIS — R2681 Unsteadiness on feet: Secondary | ICD-10-CM | POA: Diagnosis not present

## 2016-01-19 DIAGNOSIS — I739 Peripheral vascular disease, unspecified: Secondary | ICD-10-CM | POA: Diagnosis not present

## 2016-01-19 DIAGNOSIS — I129 Hypertensive chronic kidney disease with stage 1 through stage 4 chronic kidney disease, or unspecified chronic kidney disease: Secondary | ICD-10-CM | POA: Diagnosis not present

## 2016-01-19 DIAGNOSIS — N183 Chronic kidney disease, stage 3 (moderate): Secondary | ICD-10-CM | POA: Diagnosis not present

## 2016-01-19 DIAGNOSIS — I2510 Atherosclerotic heart disease of native coronary artery without angina pectoris: Secondary | ICD-10-CM | POA: Diagnosis not present

## 2016-01-19 DIAGNOSIS — Z79899 Other long term (current) drug therapy: Secondary | ICD-10-CM | POA: Diagnosis not present

## 2016-01-20 DIAGNOSIS — I739 Peripheral vascular disease, unspecified: Secondary | ICD-10-CM | POA: Diagnosis not present

## 2016-01-20 DIAGNOSIS — R2681 Unsteadiness on feet: Secondary | ICD-10-CM | POA: Diagnosis not present

## 2016-01-20 DIAGNOSIS — N183 Chronic kidney disease, stage 3 (moderate): Secondary | ICD-10-CM | POA: Diagnosis not present

## 2016-01-20 DIAGNOSIS — I2510 Atherosclerotic heart disease of native coronary artery without angina pectoris: Secondary | ICD-10-CM | POA: Diagnosis not present

## 2016-01-20 DIAGNOSIS — I129 Hypertensive chronic kidney disease with stage 1 through stage 4 chronic kidney disease, or unspecified chronic kidney disease: Secondary | ICD-10-CM | POA: Diagnosis not present

## 2016-01-20 DIAGNOSIS — F0391 Unspecified dementia with behavioral disturbance: Secondary | ICD-10-CM | POA: Diagnosis not present

## 2016-01-25 DIAGNOSIS — F0391 Unspecified dementia with behavioral disturbance: Secondary | ICD-10-CM | POA: Diagnosis not present

## 2016-01-25 DIAGNOSIS — I129 Hypertensive chronic kidney disease with stage 1 through stage 4 chronic kidney disease, or unspecified chronic kidney disease: Secondary | ICD-10-CM | POA: Diagnosis not present

## 2016-01-25 DIAGNOSIS — I2510 Atherosclerotic heart disease of native coronary artery without angina pectoris: Secondary | ICD-10-CM | POA: Diagnosis not present

## 2016-01-25 DIAGNOSIS — I739 Peripheral vascular disease, unspecified: Secondary | ICD-10-CM | POA: Diagnosis not present

## 2016-01-25 DIAGNOSIS — N183 Chronic kidney disease, stage 3 (moderate): Secondary | ICD-10-CM | POA: Diagnosis not present

## 2016-01-25 DIAGNOSIS — R451 Restlessness and agitation: Secondary | ICD-10-CM | POA: Diagnosis not present

## 2016-01-25 DIAGNOSIS — R2681 Unsteadiness on feet: Secondary | ICD-10-CM | POA: Diagnosis not present

## 2016-01-26 DIAGNOSIS — R2681 Unsteadiness on feet: Secondary | ICD-10-CM | POA: Diagnosis not present

## 2016-01-26 DIAGNOSIS — I2510 Atherosclerotic heart disease of native coronary artery without angina pectoris: Secondary | ICD-10-CM | POA: Diagnosis not present

## 2016-01-26 DIAGNOSIS — I129 Hypertensive chronic kidney disease with stage 1 through stage 4 chronic kidney disease, or unspecified chronic kidney disease: Secondary | ICD-10-CM | POA: Diagnosis not present

## 2016-01-26 DIAGNOSIS — N183 Chronic kidney disease, stage 3 (moderate): Secondary | ICD-10-CM | POA: Diagnosis not present

## 2016-01-26 DIAGNOSIS — F0391 Unspecified dementia with behavioral disturbance: Secondary | ICD-10-CM | POA: Diagnosis not present

## 2016-01-26 DIAGNOSIS — I739 Peripheral vascular disease, unspecified: Secondary | ICD-10-CM | POA: Diagnosis not present

## 2016-01-27 DIAGNOSIS — F0391 Unspecified dementia with behavioral disturbance: Secondary | ICD-10-CM | POA: Diagnosis not present

## 2016-01-27 DIAGNOSIS — R2681 Unsteadiness on feet: Secondary | ICD-10-CM | POA: Diagnosis not present

## 2016-01-27 DIAGNOSIS — I2510 Atherosclerotic heart disease of native coronary artery without angina pectoris: Secondary | ICD-10-CM | POA: Diagnosis not present

## 2016-01-27 DIAGNOSIS — I739 Peripheral vascular disease, unspecified: Secondary | ICD-10-CM | POA: Diagnosis not present

## 2016-01-27 DIAGNOSIS — N183 Chronic kidney disease, stage 3 (moderate): Secondary | ICD-10-CM | POA: Diagnosis not present

## 2016-01-27 DIAGNOSIS — I129 Hypertensive chronic kidney disease with stage 1 through stage 4 chronic kidney disease, or unspecified chronic kidney disease: Secondary | ICD-10-CM | POA: Diagnosis not present

## 2016-01-31 DIAGNOSIS — I129 Hypertensive chronic kidney disease with stage 1 through stage 4 chronic kidney disease, or unspecified chronic kidney disease: Secondary | ICD-10-CM | POA: Diagnosis not present

## 2016-01-31 DIAGNOSIS — I2510 Atherosclerotic heart disease of native coronary artery without angina pectoris: Secondary | ICD-10-CM | POA: Diagnosis not present

## 2016-01-31 DIAGNOSIS — F0391 Unspecified dementia with behavioral disturbance: Secondary | ICD-10-CM | POA: Diagnosis not present

## 2016-01-31 DIAGNOSIS — I739 Peripheral vascular disease, unspecified: Secondary | ICD-10-CM | POA: Diagnosis not present

## 2016-01-31 DIAGNOSIS — R2681 Unsteadiness on feet: Secondary | ICD-10-CM | POA: Diagnosis not present

## 2016-01-31 DIAGNOSIS — N183 Chronic kidney disease, stage 3 (moderate): Secondary | ICD-10-CM | POA: Diagnosis not present

## 2016-02-02 DIAGNOSIS — I739 Peripheral vascular disease, unspecified: Secondary | ICD-10-CM | POA: Diagnosis not present

## 2016-02-02 DIAGNOSIS — R2681 Unsteadiness on feet: Secondary | ICD-10-CM | POA: Diagnosis not present

## 2016-02-02 DIAGNOSIS — I129 Hypertensive chronic kidney disease with stage 1 through stage 4 chronic kidney disease, or unspecified chronic kidney disease: Secondary | ICD-10-CM | POA: Diagnosis not present

## 2016-02-02 DIAGNOSIS — F0391 Unspecified dementia with behavioral disturbance: Secondary | ICD-10-CM | POA: Diagnosis not present

## 2016-02-02 DIAGNOSIS — N183 Chronic kidney disease, stage 3 (moderate): Secondary | ICD-10-CM | POA: Diagnosis not present

## 2016-02-02 DIAGNOSIS — I2510 Atherosclerotic heart disease of native coronary artery without angina pectoris: Secondary | ICD-10-CM | POA: Diagnosis not present

## 2016-02-04 DIAGNOSIS — I739 Peripheral vascular disease, unspecified: Secondary | ICD-10-CM | POA: Diagnosis not present

## 2016-02-04 DIAGNOSIS — N183 Chronic kidney disease, stage 3 (moderate): Secondary | ICD-10-CM | POA: Diagnosis not present

## 2016-02-04 DIAGNOSIS — R2681 Unsteadiness on feet: Secondary | ICD-10-CM | POA: Diagnosis not present

## 2016-02-04 DIAGNOSIS — F0391 Unspecified dementia with behavioral disturbance: Secondary | ICD-10-CM | POA: Diagnosis not present

## 2016-02-04 DIAGNOSIS — I2510 Atherosclerotic heart disease of native coronary artery without angina pectoris: Secondary | ICD-10-CM | POA: Diagnosis not present

## 2016-02-04 DIAGNOSIS — I129 Hypertensive chronic kidney disease with stage 1 through stage 4 chronic kidney disease, or unspecified chronic kidney disease: Secondary | ICD-10-CM | POA: Diagnosis not present

## 2016-02-07 DIAGNOSIS — I739 Peripheral vascular disease, unspecified: Secondary | ICD-10-CM | POA: Diagnosis not present

## 2016-02-07 DIAGNOSIS — N183 Chronic kidney disease, stage 3 (moderate): Secondary | ICD-10-CM | POA: Diagnosis not present

## 2016-02-07 DIAGNOSIS — I2510 Atherosclerotic heart disease of native coronary artery without angina pectoris: Secondary | ICD-10-CM | POA: Diagnosis not present

## 2016-02-07 DIAGNOSIS — F0391 Unspecified dementia with behavioral disturbance: Secondary | ICD-10-CM | POA: Diagnosis not present

## 2016-02-07 DIAGNOSIS — R2681 Unsteadiness on feet: Secondary | ICD-10-CM | POA: Diagnosis not present

## 2016-02-07 DIAGNOSIS — I129 Hypertensive chronic kidney disease with stage 1 through stage 4 chronic kidney disease, or unspecified chronic kidney disease: Secondary | ICD-10-CM | POA: Diagnosis not present

## 2016-02-09 DIAGNOSIS — N183 Chronic kidney disease, stage 3 (moderate): Secondary | ICD-10-CM | POA: Diagnosis not present

## 2016-02-09 DIAGNOSIS — I2510 Atherosclerotic heart disease of native coronary artery without angina pectoris: Secondary | ICD-10-CM | POA: Diagnosis not present

## 2016-02-09 DIAGNOSIS — I129 Hypertensive chronic kidney disease with stage 1 through stage 4 chronic kidney disease, or unspecified chronic kidney disease: Secondary | ICD-10-CM | POA: Diagnosis not present

## 2016-02-09 DIAGNOSIS — I739 Peripheral vascular disease, unspecified: Secondary | ICD-10-CM | POA: Diagnosis not present

## 2016-02-09 DIAGNOSIS — R2681 Unsteadiness on feet: Secondary | ICD-10-CM | POA: Diagnosis not present

## 2016-02-09 DIAGNOSIS — F0391 Unspecified dementia with behavioral disturbance: Secondary | ICD-10-CM | POA: Diagnosis not present

## 2016-02-11 DIAGNOSIS — I2510 Atherosclerotic heart disease of native coronary artery without angina pectoris: Secondary | ICD-10-CM | POA: Diagnosis not present

## 2016-02-11 DIAGNOSIS — I739 Peripheral vascular disease, unspecified: Secondary | ICD-10-CM | POA: Diagnosis not present

## 2016-02-11 DIAGNOSIS — R2681 Unsteadiness on feet: Secondary | ICD-10-CM | POA: Diagnosis not present

## 2016-02-11 DIAGNOSIS — F0391 Unspecified dementia with behavioral disturbance: Secondary | ICD-10-CM | POA: Diagnosis not present

## 2016-02-11 DIAGNOSIS — N183 Chronic kidney disease, stage 3 (moderate): Secondary | ICD-10-CM | POA: Diagnosis not present

## 2016-02-11 DIAGNOSIS — I129 Hypertensive chronic kidney disease with stage 1 through stage 4 chronic kidney disease, or unspecified chronic kidney disease: Secondary | ICD-10-CM | POA: Diagnosis not present

## 2016-02-14 DIAGNOSIS — I739 Peripheral vascular disease, unspecified: Secondary | ICD-10-CM | POA: Diagnosis not present

## 2016-02-14 DIAGNOSIS — R2681 Unsteadiness on feet: Secondary | ICD-10-CM | POA: Diagnosis not present

## 2016-02-14 DIAGNOSIS — I129 Hypertensive chronic kidney disease with stage 1 through stage 4 chronic kidney disease, or unspecified chronic kidney disease: Secondary | ICD-10-CM | POA: Diagnosis not present

## 2016-02-14 DIAGNOSIS — F0391 Unspecified dementia with behavioral disturbance: Secondary | ICD-10-CM | POA: Diagnosis not present

## 2016-02-14 DIAGNOSIS — I2510 Atherosclerotic heart disease of native coronary artery without angina pectoris: Secondary | ICD-10-CM | POA: Diagnosis not present

## 2016-02-14 DIAGNOSIS — N183 Chronic kidney disease, stage 3 (moderate): Secondary | ICD-10-CM | POA: Diagnosis not present

## 2016-02-16 DIAGNOSIS — G4700 Insomnia, unspecified: Secondary | ICD-10-CM | POA: Diagnosis not present

## 2016-02-16 DIAGNOSIS — I10 Essential (primary) hypertension: Secondary | ICD-10-CM | POA: Diagnosis not present

## 2016-02-16 DIAGNOSIS — G309 Alzheimer's disease, unspecified: Secondary | ICD-10-CM | POA: Diagnosis not present

## 2016-02-16 DIAGNOSIS — F419 Anxiety disorder, unspecified: Secondary | ICD-10-CM | POA: Diagnosis not present

## 2016-02-16 DIAGNOSIS — Z79899 Other long term (current) drug therapy: Secondary | ICD-10-CM | POA: Diagnosis not present

## 2016-02-18 DIAGNOSIS — N183 Chronic kidney disease, stage 3 (moderate): Secondary | ICD-10-CM | POA: Diagnosis not present

## 2016-02-18 DIAGNOSIS — R2681 Unsteadiness on feet: Secondary | ICD-10-CM | POA: Diagnosis not present

## 2016-02-18 DIAGNOSIS — I739 Peripheral vascular disease, unspecified: Secondary | ICD-10-CM | POA: Diagnosis not present

## 2016-02-18 DIAGNOSIS — I2510 Atherosclerotic heart disease of native coronary artery without angina pectoris: Secondary | ICD-10-CM | POA: Diagnosis not present

## 2016-02-18 DIAGNOSIS — F0391 Unspecified dementia with behavioral disturbance: Secondary | ICD-10-CM | POA: Diagnosis not present

## 2016-02-18 DIAGNOSIS — I129 Hypertensive chronic kidney disease with stage 1 through stage 4 chronic kidney disease, or unspecified chronic kidney disease: Secondary | ICD-10-CM | POA: Diagnosis not present

## 2016-02-23 DIAGNOSIS — G4700 Insomnia, unspecified: Secondary | ICD-10-CM | POA: Diagnosis not present

## 2016-02-23 DIAGNOSIS — I10 Essential (primary) hypertension: Secondary | ICD-10-CM | POA: Diagnosis not present

## 2016-02-23 DIAGNOSIS — Z79899 Other long term (current) drug therapy: Secondary | ICD-10-CM | POA: Diagnosis not present

## 2016-02-23 DIAGNOSIS — G309 Alzheimer's disease, unspecified: Secondary | ICD-10-CM | POA: Diagnosis not present

## 2016-02-23 DIAGNOSIS — F419 Anxiety disorder, unspecified: Secondary | ICD-10-CM | POA: Diagnosis not present

## 2016-02-24 DIAGNOSIS — I129 Hypertensive chronic kidney disease with stage 1 through stage 4 chronic kidney disease, or unspecified chronic kidney disease: Secondary | ICD-10-CM | POA: Diagnosis not present

## 2016-02-24 DIAGNOSIS — R2681 Unsteadiness on feet: Secondary | ICD-10-CM | POA: Diagnosis not present

## 2016-02-24 DIAGNOSIS — F0391 Unspecified dementia with behavioral disturbance: Secondary | ICD-10-CM | POA: Diagnosis not present

## 2016-02-24 DIAGNOSIS — N183 Chronic kidney disease, stage 3 (moderate): Secondary | ICD-10-CM | POA: Diagnosis not present

## 2016-02-24 DIAGNOSIS — I739 Peripheral vascular disease, unspecified: Secondary | ICD-10-CM | POA: Diagnosis not present

## 2016-02-24 DIAGNOSIS — I2510 Atherosclerotic heart disease of native coronary artery without angina pectoris: Secondary | ICD-10-CM | POA: Diagnosis not present

## 2016-03-17 DIAGNOSIS — R05 Cough: Secondary | ICD-10-CM | POA: Diagnosis not present

## 2016-04-05 DIAGNOSIS — I10 Essential (primary) hypertension: Secondary | ICD-10-CM | POA: Diagnosis not present

## 2016-04-05 DIAGNOSIS — G4700 Insomnia, unspecified: Secondary | ICD-10-CM | POA: Diagnosis not present

## 2016-04-05 DIAGNOSIS — R05 Cough: Secondary | ICD-10-CM | POA: Diagnosis not present

## 2016-04-05 DIAGNOSIS — F419 Anxiety disorder, unspecified: Secondary | ICD-10-CM | POA: Diagnosis not present

## 2016-04-05 DIAGNOSIS — R5383 Other fatigue: Secondary | ICD-10-CM | POA: Diagnosis not present

## 2016-04-05 DIAGNOSIS — G309 Alzheimer's disease, unspecified: Secondary | ICD-10-CM | POA: Diagnosis not present

## 2016-04-05 DIAGNOSIS — Z79899 Other long term (current) drug therapy: Secondary | ICD-10-CM | POA: Diagnosis not present

## 2016-04-19 DIAGNOSIS — F419 Anxiety disorder, unspecified: Secondary | ICD-10-CM | POA: Diagnosis not present

## 2016-04-19 DIAGNOSIS — G4700 Insomnia, unspecified: Secondary | ICD-10-CM | POA: Diagnosis not present

## 2016-04-19 DIAGNOSIS — Z79899 Other long term (current) drug therapy: Secondary | ICD-10-CM | POA: Diagnosis not present

## 2016-04-19 DIAGNOSIS — R5383 Other fatigue: Secondary | ICD-10-CM | POA: Diagnosis not present

## 2016-04-19 DIAGNOSIS — I10 Essential (primary) hypertension: Secondary | ICD-10-CM | POA: Diagnosis not present

## 2016-04-19 DIAGNOSIS — G309 Alzheimer's disease, unspecified: Secondary | ICD-10-CM | POA: Diagnosis not present

## 2016-04-19 DIAGNOSIS — M1990 Unspecified osteoarthritis, unspecified site: Secondary | ICD-10-CM | POA: Diagnosis not present

## 2016-04-20 DIAGNOSIS — Z79899 Other long term (current) drug therapy: Secondary | ICD-10-CM | POA: Diagnosis not present

## 2016-04-28 ENCOUNTER — Encounter (HOSPITAL_COMMUNITY): Payer: Self-pay | Admitting: Emergency Medicine

## 2016-04-28 ENCOUNTER — Emergency Department (HOSPITAL_COMMUNITY): Payer: Medicare Other

## 2016-04-28 ENCOUNTER — Inpatient Hospital Stay (HOSPITAL_COMMUNITY)
Admission: EM | Admit: 2016-04-28 | Discharge: 2016-05-02 | DRG: 871 | Disposition: A | Discharge status: Skilled Nursing Facility | Payer: Medicare Other | Attending: Internal Medicine | Admitting: Internal Medicine

## 2016-04-28 DIAGNOSIS — R451 Restlessness and agitation: Secondary | ICD-10-CM | POA: Diagnosis present

## 2016-04-28 DIAGNOSIS — Z66 Do not resuscitate: Secondary | ICD-10-CM | POA: Diagnosis present

## 2016-04-28 DIAGNOSIS — Z9109 Other allergy status, other than to drugs and biological substances: Secondary | ICD-10-CM

## 2016-04-28 DIAGNOSIS — E785 Hyperlipidemia, unspecified: Secondary | ICD-10-CM | POA: Diagnosis present

## 2016-04-28 DIAGNOSIS — E039 Hypothyroidism, unspecified: Secondary | ICD-10-CM | POA: Diagnosis present

## 2016-04-28 DIAGNOSIS — I714 Abdominal aortic aneurysm, without rupture: Secondary | ICD-10-CM | POA: Diagnosis present

## 2016-04-28 DIAGNOSIS — I4581 Long QT syndrome: Secondary | ICD-10-CM | POA: Diagnosis present

## 2016-04-28 DIAGNOSIS — N183 Chronic kidney disease, stage 3 unspecified: Secondary | ICD-10-CM | POA: Diagnosis present

## 2016-04-28 DIAGNOSIS — H5440 Blindness, one eye, unspecified eye: Secondary | ICD-10-CM | POA: Diagnosis present

## 2016-04-28 DIAGNOSIS — A419 Sepsis, unspecified organism: Secondary | ICD-10-CM | POA: Diagnosis not present

## 2016-04-28 DIAGNOSIS — I2510 Atherosclerotic heart disease of native coronary artery without angina pectoris: Secondary | ICD-10-CM | POA: Diagnosis present

## 2016-04-28 DIAGNOSIS — Z85819 Personal history of malignant neoplasm of unspecified site of lip, oral cavity, and pharynx: Secondary | ICD-10-CM

## 2016-04-28 DIAGNOSIS — R0902 Hypoxemia: Secondary | ICD-10-CM | POA: Diagnosis not present

## 2016-04-28 DIAGNOSIS — Y95 Nosocomial condition: Secondary | ICD-10-CM | POA: Diagnosis present

## 2016-04-28 DIAGNOSIS — Z791 Long term (current) use of non-steroidal anti-inflammatories (NSAID): Secondary | ICD-10-CM

## 2016-04-28 DIAGNOSIS — N390 Urinary tract infection, site not specified: Secondary | ICD-10-CM | POA: Diagnosis present

## 2016-04-28 DIAGNOSIS — Z8673 Personal history of transient ischemic attack (TIA), and cerebral infarction without residual deficits: Secondary | ICD-10-CM

## 2016-04-28 DIAGNOSIS — H9190 Unspecified hearing loss, unspecified ear: Secondary | ICD-10-CM | POA: Diagnosis present

## 2016-04-28 DIAGNOSIS — R627 Adult failure to thrive: Secondary | ICD-10-CM | POA: Diagnosis present

## 2016-04-28 DIAGNOSIS — J069 Acute upper respiratory infection, unspecified: Secondary | ICD-10-CM | POA: Diagnosis present

## 2016-04-28 DIAGNOSIS — J180 Bronchopneumonia, unspecified organism: Secondary | ICD-10-CM | POA: Diagnosis present

## 2016-04-28 DIAGNOSIS — N400 Enlarged prostate without lower urinary tract symptoms: Secondary | ICD-10-CM | POA: Diagnosis present

## 2016-04-28 DIAGNOSIS — Z974 Presence of external hearing-aid: Secondary | ICD-10-CM

## 2016-04-28 DIAGNOSIS — Z881 Allergy status to other antibiotic agents status: Secondary | ICD-10-CM

## 2016-04-28 DIAGNOSIS — F0151 Vascular dementia with behavioral disturbance: Secondary | ICD-10-CM | POA: Diagnosis present

## 2016-04-28 DIAGNOSIS — I129 Hypertensive chronic kidney disease with stage 1 through stage 4 chronic kidney disease, or unspecified chronic kidney disease: Secondary | ICD-10-CM | POA: Diagnosis present

## 2016-04-28 DIAGNOSIS — Z823 Family history of stroke: Secondary | ICD-10-CM

## 2016-04-28 DIAGNOSIS — I081 Rheumatic disorders of both mitral and tricuspid valves: Secondary | ICD-10-CM | POA: Diagnosis present

## 2016-04-28 DIAGNOSIS — R0602 Shortness of breath: Secondary | ICD-10-CM | POA: Diagnosis not present

## 2016-04-28 DIAGNOSIS — Z885 Allergy status to narcotic agent status: Secondary | ICD-10-CM

## 2016-04-28 DIAGNOSIS — R0600 Dyspnea, unspecified: Secondary | ICD-10-CM | POA: Diagnosis not present

## 2016-04-28 DIAGNOSIS — Z87891 Personal history of nicotine dependence: Secondary | ICD-10-CM | POA: Diagnosis not present

## 2016-04-28 DIAGNOSIS — J9601 Acute respiratory failure with hypoxia: Secondary | ICD-10-CM | POA: Diagnosis present

## 2016-04-28 DIAGNOSIS — Z7982 Long term (current) use of aspirin: Secondary | ICD-10-CM

## 2016-04-28 DIAGNOSIS — I519 Heart disease, unspecified: Secondary | ICD-10-CM | POA: Diagnosis present

## 2016-04-28 DIAGNOSIS — F01518 Vascular dementia, unspecified severity, with other behavioral disturbance: Secondary | ICD-10-CM | POA: Diagnosis present

## 2016-04-28 DIAGNOSIS — Z951 Presence of aortocoronary bypass graft: Secondary | ICD-10-CM

## 2016-04-28 DIAGNOSIS — Z79899 Other long term (current) drug therapy: Secondary | ICD-10-CM

## 2016-04-28 DIAGNOSIS — J181 Lobar pneumonia, unspecified organism: Secondary | ICD-10-CM

## 2016-04-28 DIAGNOSIS — R1310 Dysphagia, unspecified: Secondary | ICD-10-CM | POA: Diagnosis present

## 2016-04-28 DIAGNOSIS — F17220 Nicotine dependence, chewing tobacco, uncomplicated: Secondary | ICD-10-CM | POA: Diagnosis present

## 2016-04-28 DIAGNOSIS — I739 Peripheral vascular disease, unspecified: Secondary | ICD-10-CM | POA: Diagnosis present

## 2016-04-28 DIAGNOSIS — N179 Acute kidney failure, unspecified: Secondary | ICD-10-CM | POA: Diagnosis present

## 2016-04-28 DIAGNOSIS — K219 Gastro-esophageal reflux disease without esophagitis: Secondary | ICD-10-CM | POA: Diagnosis present

## 2016-04-28 DIAGNOSIS — Z8249 Family history of ischemic heart disease and other diseases of the circulatory system: Secondary | ICD-10-CM

## 2016-04-28 DIAGNOSIS — J9600 Acute respiratory failure, unspecified whether with hypoxia or hypercapnia: Secondary | ICD-10-CM | POA: Diagnosis not present

## 2016-04-28 DIAGNOSIS — R4182 Altered mental status, unspecified: Secondary | ICD-10-CM | POA: Diagnosis not present

## 2016-04-28 DIAGNOSIS — E869 Volume depletion, unspecified: Secondary | ICD-10-CM | POA: Diagnosis present

## 2016-04-28 DIAGNOSIS — R8290 Unspecified abnormal findings in urine: Secondary | ICD-10-CM | POA: Diagnosis present

## 2016-04-28 DIAGNOSIS — T17908D Unspecified foreign body in respiratory tract, part unspecified causing other injury, subsequent encounter: Secondary | ICD-10-CM

## 2016-04-28 DIAGNOSIS — I10 Essential (primary) hypertension: Secondary | ICD-10-CM | POA: Diagnosis present

## 2016-04-28 DIAGNOSIS — J189 Pneumonia, unspecified organism: Secondary | ICD-10-CM | POA: Diagnosis present

## 2016-04-28 DIAGNOSIS — Z87442 Personal history of urinary calculi: Secondary | ICD-10-CM

## 2016-04-28 LAB — CBC WITH DIFFERENTIAL/PLATELET
Basophils Absolute: 0 10*3/uL (ref 0.0–0.1)
Basophils Relative: 0 %
EOS ABS: 0.1 10*3/uL (ref 0.0–0.7)
Eosinophils Relative: 1 %
HCT: 40.8 % (ref 39.0–52.0)
HEMOGLOBIN: 13.3 g/dL (ref 13.0–17.0)
LYMPHS ABS: 0.8 10*3/uL (ref 0.7–4.0)
Lymphocytes Relative: 9 %
MCH: 31.6 pg (ref 26.0–34.0)
MCHC: 32.6 g/dL (ref 30.0–36.0)
MCV: 96.9 fL (ref 78.0–100.0)
MONO ABS: 0.6 10*3/uL (ref 0.1–1.0)
MONOS PCT: 7 %
NEUTROS PCT: 83 %
Neutro Abs: 7.1 10*3/uL (ref 1.7–7.7)
Platelets: 161 10*3/uL (ref 150–400)
RBC: 4.21 MIL/uL — ABNORMAL LOW (ref 4.22–5.81)
RDW: 14.7 % (ref 11.5–15.5)
WBC: 8.5 10*3/uL (ref 4.0–10.5)

## 2016-04-28 LAB — COMPREHENSIVE METABOLIC PANEL
ALBUMIN: 2.9 g/dL — AB (ref 3.5–5.0)
ALK PHOS: 61 U/L (ref 38–126)
ALT: 13 U/L — ABNORMAL LOW (ref 17–63)
ANION GAP: 9 (ref 5–15)
AST: 19 U/L (ref 15–41)
BILIRUBIN TOTAL: 0.7 mg/dL (ref 0.3–1.2)
BUN: 27 mg/dL — ABNORMAL HIGH (ref 6–20)
CALCIUM: 8.6 mg/dL — AB (ref 8.9–10.3)
CO2: 24 mmol/L (ref 22–32)
Chloride: 110 mmol/L (ref 101–111)
Creatinine, Ser: 1.28 mg/dL — ABNORMAL HIGH (ref 0.61–1.24)
GFR calc non Af Amer: 50 mL/min — ABNORMAL LOW (ref >60)
GFR, EST AFRICAN AMERICAN: 58 mL/min — AB (ref >60)
GLUCOSE: 90 mg/dL (ref 65–99)
POTASSIUM: 4.4 mmol/L (ref 3.5–5.1)
Sodium: 143 mmol/L (ref 135–145)
TOTAL PROTEIN: 5.8 g/dL — AB (ref 6.5–8.1)

## 2016-04-28 LAB — URINALYSIS, ROUTINE W REFLEX MICROSCOPIC
Bacteria, UA: NONE SEEN
Bilirubin Urine: NEGATIVE
GLUCOSE, UA: NEGATIVE mg/dL
Ketones, ur: 20 mg/dL — AB
NITRITE: NEGATIVE
PH: 6 (ref 5.0–8.0)
PROTEIN: 30 mg/dL — AB
SPECIFIC GRAVITY, URINE: 1.019 (ref 1.005–1.030)

## 2016-04-28 LAB — TSH: TSH: 3.719 u[IU]/mL (ref 0.350–4.500)

## 2016-04-28 LAB — INFLUENZA PANEL BY PCR (TYPE A & B)
INFLBPCR: NEGATIVE
Influenza A By PCR: NEGATIVE

## 2016-04-28 LAB — MRSA PCR SCREENING: MRSA by PCR: NEGATIVE

## 2016-04-28 LAB — I-STAT CG4 LACTIC ACID, ED: Lactic Acid, Venous: 0.99 mmol/L (ref 0.5–1.9)

## 2016-04-28 LAB — SEDIMENTATION RATE: Sed Rate: 16 mm/hr (ref 0–16)

## 2016-04-28 LAB — STREP PNEUMONIAE URINARY ANTIGEN: Strep Pneumo Urinary Antigen: NEGATIVE

## 2016-04-28 LAB — TROPONIN I

## 2016-04-28 LAB — BRAIN NATRIURETIC PEPTIDE: B Natriuretic Peptide: 318.6 pg/mL — ABNORMAL HIGH (ref 0.0–100.0)

## 2016-04-28 LAB — PROCALCITONIN

## 2016-04-28 MED ORDER — POLYETHYLENE GLYCOL 3350 17 G PO PACK
17.0000 g | PACK | Freq: Two times a day (BID) | ORAL | Status: DC
  Administered 2016-04-28 – 2016-04-30 (×4): 17 g via ORAL
  Filled 2016-04-28 (×7): qty 1

## 2016-04-28 MED ORDER — VANCOMYCIN HCL 10 G IV SOLR
1250.0000 mg | Freq: Once | INTRAVENOUS | Status: AC
  Administered 2016-04-28: 1250 mg via INTRAVENOUS
  Filled 2016-04-28: qty 1250

## 2016-04-28 MED ORDER — VITAMIN D (ERGOCALCIFEROL) 1.25 MG (50000 UNIT) PO CAPS
50000.0000 [IU] | ORAL_CAPSULE | ORAL | Status: DC
  Administered 2016-04-29: 50000 [IU] via ORAL
  Filled 2016-04-28 (×2): qty 1

## 2016-04-28 MED ORDER — SODIUM CHLORIDE 0.9 % IV SOLN
INTRAVENOUS | Status: DC
  Administered 2016-04-28 – 2016-04-30 (×3): via INTRAVENOUS

## 2016-04-28 MED ORDER — DEXTROSE 5 % IV SOLN
2.0000 g | INTRAVENOUS | Status: DC
  Administered 2016-04-28 – 2016-04-29 (×2): 2 g via INTRAVENOUS
  Filled 2016-04-28 (×2): qty 2

## 2016-04-28 MED ORDER — LEVOTHYROXINE SODIUM 50 MCG PO TABS
50.0000 ug | ORAL_TABLET | Freq: Every day | ORAL | Status: DC
  Administered 2016-04-29 – 2016-05-02 (×4): 50 ug via ORAL
  Filled 2016-04-28 (×4): qty 1

## 2016-04-28 MED ORDER — DEXTROSE 5 % IV SOLN
500.0000 mg | Freq: Once | INTRAVENOUS | Status: AC
  Administered 2016-04-28: 500 mg via INTRAVENOUS
  Filled 2016-04-28: qty 500

## 2016-04-28 MED ORDER — ACETAMINOPHEN 325 MG PO TABS
650.0000 mg | ORAL_TABLET | Freq: Four times a day (QID) | ORAL | Status: DC | PRN
  Administered 2016-04-28: 650 mg via ORAL
  Filled 2016-04-28: qty 2

## 2016-04-28 MED ORDER — OLANZAPINE 5 MG PO TABS
5.0000 mg | ORAL_TABLET | Freq: Every day | ORAL | Status: DC
  Administered 2016-04-28 – 2016-05-01 (×4): 5 mg via ORAL
  Filled 2016-04-28 (×5): qty 1

## 2016-04-28 MED ORDER — TRAZODONE HCL 50 MG PO TABS
50.0000 mg | ORAL_TABLET | Freq: Every day | ORAL | Status: DC
  Administered 2016-04-28 – 2016-05-01 (×4): 50 mg via ORAL
  Filled 2016-04-28 (×4): qty 1

## 2016-04-28 MED ORDER — DIVALPROEX SODIUM 125 MG PO CSDR
500.0000 mg | DELAYED_RELEASE_CAPSULE | Freq: Two times a day (BID) | ORAL | Status: DC
  Administered 2016-04-28 – 2016-05-02 (×8): 500 mg via ORAL
  Filled 2016-04-28 (×11): qty 4

## 2016-04-28 MED ORDER — BRIMONIDINE TARTRATE 0.15 % OP SOLN
1.0000 [drp] | Freq: Every day | OPHTHALMIC | Status: DC
  Administered 2016-04-28 – 2016-05-01 (×4): 1 [drp] via OPHTHALMIC
  Filled 2016-04-28: qty 5

## 2016-04-28 MED ORDER — SODIUM CHLORIDE 0.9 % IV BOLUS (SEPSIS)
1000.0000 mL | Freq: Once | INTRAVENOUS | Status: AC
  Administered 2016-04-28: 1000 mL via INTRAVENOUS

## 2016-04-28 MED ORDER — SODIUM CHLORIDE 0.9 % IV BOLUS (SEPSIS): 1000.0000 mL | Freq: Once | INTRAVENOUS | Status: DC

## 2016-04-28 MED ORDER — ASPIRIN 81 MG PO CHEW
81.0000 mg | CHEWABLE_TABLET | Freq: Every day | ORAL | Status: DC
  Administered 2016-04-28 – 2016-04-30 (×3): 81 mg via ORAL
  Filled 2016-04-28 (×5): qty 1

## 2016-04-28 MED ORDER — LORAZEPAM 0.5 MG PO TABS
0.5000 mg | ORAL_TABLET | Freq: Two times a day (BID) | ORAL | Status: DC | PRN
  Administered 2016-05-01: 0.5 mg via ORAL
  Filled 2016-04-28: qty 1

## 2016-04-28 MED ORDER — FINASTERIDE 5 MG PO TABS
5.0000 mg | ORAL_TABLET | Freq: Every day | ORAL | Status: DC
  Administered 2016-04-28 – 2016-05-02 (×4): 5 mg via ORAL
  Filled 2016-04-28 (×5): qty 1

## 2016-04-28 MED ORDER — ENSURE ENLIVE PO LIQD
237.0000 mL | Freq: Two times a day (BID) | ORAL | Status: DC
  Administered 2016-04-30 – 2016-05-02 (×5): 237 mL via ORAL

## 2016-04-28 MED ORDER — DEXTROSE 5 % IV SOLN
1.0000 g | Freq: Once | INTRAVENOUS | Status: AC
  Administered 2016-04-28: 1 g via INTRAVENOUS
  Filled 2016-04-28: qty 10

## 2016-04-28 MED ORDER — ACETAMINOPHEN 650 MG RE SUPP
650.0000 mg | Freq: Four times a day (QID) | RECTAL | Status: DC | PRN
  Administered 2016-04-29: 650 mg via RECTAL
  Filled 2016-04-28: qty 1

## 2016-04-28 MED ORDER — VANCOMYCIN HCL IN DEXTROSE 1-5 GM/200ML-% IV SOLN: 1000.0000 mg | INTRAVENOUS | Status: DC

## 2016-04-28 MED ORDER — ESCITALOPRAM OXALATE 20 MG PO TABS
20.0000 mg | ORAL_TABLET | Freq: Every day | ORAL | Status: DC
  Administered 2016-04-28 – 2016-05-02 (×4): 20 mg via ORAL
  Filled 2016-04-28 (×5): qty 1

## 2016-04-28 MED ORDER — CELECOXIB 100 MG PO CAPS
100.0000 mg | ORAL_CAPSULE | Freq: Two times a day (BID) | ORAL | Status: DC
  Administered 2016-04-28 – 2016-04-29 (×3): 100 mg via ORAL
  Filled 2016-04-28 (×3): qty 1

## 2016-04-28 MED ORDER — ENOXAPARIN SODIUM 40 MG/0.4ML ~~LOC~~ SOLN
40.0000 mg | SUBCUTANEOUS | Status: DC
  Administered 2016-04-28 – 2016-05-02 (×5): 40 mg via SUBCUTANEOUS
  Filled 2016-04-28 (×5): qty 0.4

## 2016-04-28 NOTE — ED Notes (Signed)
Attempted report x1. 

## 2016-04-28 NOTE — Progress Notes (Signed)
SLP Cancellation Note  Patient Details Name: [name] MRN: [id number] DOB: 16-Jan-1932   Cancelled treatment:  Attempted to see patient for bedside swallow evaluation. Pt's daughter declined-requests that her father sleep and rest for now. ST will continue efforts.        Fransisca Kaufmann , [place] 04/28/2016, 10:44 AM

## 2016-04-28 NOTE — Care Management Note (Signed)
Case Management Note Marvetta Gibbons RN, BSN Unit 2W-Case Manager [phone]  Patient Details  Name: [name] MRN: [id number] Date of Birth: 11/23/1931  Subjective/Objective:  Pt admitted with HCAP                  Action/Plan: PTA pt lived at [facility] to follow- for ?return vs alternative needs- PC has been consulted for [facility].   Expected Discharge Date:                  Expected Discharge Plan:  Assisted Living / Rest Home  In-House Referral:  Clinical Social Work  Discharge planning Services  CM Consult  Post Acute Care Choice:    Choice offered to:     DME Arranged:    DME Agency:     HH Arranged:    HH Agency:     Status of Service:  In process, will continue to follow  If discussed at Long Length of Stay Meetings, dates discussed:    Discharge Disposition:   Additional Comments:  Dawayne Patricia, RN 04/28/2016, 12:09 PM

## 2016-04-28 NOTE — ED Notes (Signed)
Pt combative with staff, attempting to kick staff, pulling monitor wires off and pulse ox off.

## 2016-04-28 NOTE — Evaluation (Signed)
Clinical/Bedside Swallow Evaluation Patient Details  Name: [name] MRN: [id number] Date of Birth: 1931-01-29  Today's Date: 04/28/2016 Time: SLP Start Time (ACUTE ONLY): 1419 SLP Stop Time (ACUTE ONLY): 1445 SLP Time Calculation (min) (ACUTE ONLY): 26 min  Past Medical History:  Past Medical History:  Diagnosis Date  . Abdominal aortic aneurysm ([place])    a. 07/2014 Abd U/S: 3.61 cm, RCIA >50d.  Marland Kitchen Aphthous ulcer   . BPH (benign prostatic hypertrophy)   . CAD (coronary artery disease)    a. s/p CABG 1993;  b. cath 10/10: severe native 3v CAD, S-Dx ok, S-OM1/OM2 ok, S-PDA ok, L-LAD ok, EF 60%.  . Carotid stenosis    a. s/p Left CEA, followed by Dr. Donnetta Hutching;  b. 07/2014 u/s: RICA < 20, LICA patent.  . Cervical spondylosis without myelopathy   . CKD (chronic kidney disease), stage III   . GERD (gastroesophageal reflux disease)   . History of nephrolithiasis   . HOH (hard of hearing)    hearing aids  . Hyperlipidemia   . Memory deficits 06/19/2013  . Pre-syncope   . Radicular low back pain   . Throat cancer ([place])   . 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 Unspecified adverse effect of unspecified drug, medicinal and biological substance    Past Surgical History:  Past Surgical History:  Procedure Laterality Date  . BACK SURGERY    . CARDIAC CATHETERIZATION    . CAROTID ENDARTERECTOMY Left   . CHOLECYSTECTOMY    . CORONARY ARTERY BYPASS GRAFT    . CORONARY ARTERY BYPASS GRAFT  1998  . HERNIA REPAIR     unclear which side  . INGUINAL HERNIA REPAIR Right 09/29/2013   Procedure: HERNIA REPAIR INGUINAL ADULT;  Surgeon: Rolm Bookbinder, MD;  Location: [facility];  Service: General;  Laterality: Right;  . INSERTION OF MESH Right 09/29/2013   Procedure: INSERTION OF MESH;  Surgeon: Rolm Bookbinder, MD;  Location: [facility];  Service: General;  Laterality: Right;  . PROSTATE SURGERY    . renal stone surgery     3 times  . THROAT SURGERY     HPI:  [name] a 83 y.o.male with  PMH of dementia,abdominal aortic aneurysm, CAD, CKD, CABG x5 with EF of 60%,throat cancer (1978 per dtr), and HLD presents with increased shortness of breath  and cough. Pt is a resident at [facility] and was running a fever of unknown temperature throughout the day. CXR showed left basilar airspace opacity raises concern for pneumonia.    Assessment / Plan / Recommendation Clinical Impression  Mr. Lamson was lethargic and confused during the bedside swallow evaluation and a full oral motor assessment was not completed due to the pt's mentation. Pt's daughter reported that she has not noticed any pharyngeal difficulties with her father's swallowing, however she stated that he does demonstrate signs of oral deficits (pocketing of food). Noted a wet vocal quality and gurgling within the pharynx prior to and during the bedside swallow eval. Pt did not exhibit any oral phase impairments during trials of pureed solids and thin liquids, however observed delayed cough with thin liquids. Educated pt's daughter re: options for modified diets/textures vs. instrumental testing and agreed that he is not appropriate given severity of demenia. Suspect improvements once pt closer to baseline. Recommend downgrading diet from regular solids to Dys 1 solids, continue thin liquids (no straws), meds crushed in puree. ST will f/u for dysphagia treatment, continued education, and possible  diet modifications. SLP Visit Diagnosis: Dysphagia, unspecified (R13.10)    Aspiration Risk  Moderate aspiration risk    Diet Recommendation Dysphagia 1 (Puree);Thin liquid   Liquid Administration via: Cup;No straw Medication Administration: Crushed with puree Supervision: Full supervision/cueing for compensatory strategies;Staff to assist with self feeding Compensations: Slow rate;Small sips/bites;Minimize environmental distractions Postural Changes: Seated upright at 90 degrees    Other  Recommendations Oral Care  Recommendations: Oral care BID   Follow up Recommendations Skilled Nursing facility      Frequency and Duration min 2x/week  2 weeks       Prognosis Prognosis for Safe Diet Advancement: Good Barriers to Reach Goals: Cognitive deficits      Swallow Study   General HPI: [name] a 83 y.o.male with PMH of dementia,abdominal aortic aneurysm, CAD, CKD, CABG x5 with EF of 60%,throat cancer (1978 per dtr), and HLD presents with increased shortness of breath  and cough. Pt is a resident at [facility] and was running a fever of unknown temperature throughout the day. CXR showed left basilar airspace opacity raises concern for pneumonia.  Type of Study: Bedside Swallow Evaluation Previous Swallow Assessment: none noted Diet Prior to this Study: Regular;Thin liquids Temperature Spikes Noted: No Respiratory Status: Room air History of Recent Intubation: No Behavior/Cognition: Lethargic/Drowsy;Requires cueing Oral Cavity Assessment: Other (comment) (difficult to assess due to lethargy and mentation) Oral Care Completed by SLP: No Oral Cavity - Dentition: Missing dentition Vision:  (pt kept eyes closed) Self-Feeding Abilities: Total assist Patient Positioning: Upright in bed Baseline Vocal Quality: Low vocal intensity Volitional Cough: Cognitively unable to elicit Volitional Swallow: Unable to elicit    Oral/Motor/Sensory Function Overall Oral Motor/Sensory Function: Other (comment) (unable to assess due to mentation and lethargy)   Ice Chips Ice chips: Not tested   Thin Liquid Thin Liquid: Impaired Presentation: Straw;Cup Oral Phase Impairments:  (none) Oral Phase Functional Implications:  (none) Pharyngeal  Phase Impairments: Cough - Delayed    Nectar Thick Nectar Thick Liquid: Not tested   Honey Thick Honey Thick Liquid: Not tested   Puree Puree: Within functional limits Presentation: Spoon   Solid   GO   Solid: Not tested        Fransisca Kaufmann ,  Student-SLP 04/28/2016,3:37 PM

## 2016-04-28 NOTE — Progress Notes (Signed)
PHARMACY NOTE:  ANTIMICROBIAL RENAL DOSAGE ADJUSTMENT  Current antimicrobial regimen includes a mismatch between antimicrobial dosage and estimated renal function.  As per policy approved by the Pharmacy & Therapeutics and Medical Executive Committees, the antimicrobial dosage will be adjusted accordingly.  Current antimicrobial dosage:  Cefepime 1gm IV Q8H x 8 days  Indication: PNA  Renal Function:  Estimated Creatinine Clearance: 43.8 mL/min (A) (by C-G formula based on SCr of 1.28 mg/dL (H)). []      On intermittent HD, scheduled: []      On CRRT    Antimicrobial dosage has been changed to:  Cefepime 2gm IV Q24H x 8 days  Additional comments: Will readjust when renal function improves.    Nakeesha Bowler D. Mina Marble, PharmD, BCPS Pager:  [phone] 04/28/2016, 7:19 AM

## 2016-04-28 NOTE — Progress Notes (Signed)
Pharmacy Antibiotic Note  [name] is a 83 y.o. male admitted on 04/28/2016 with SOB/Cough, possible HCAP.  Pharmacy has been consulted for Vancomycin  dosing.  Plan: Vancomycin 1250 mg IV now, then 1 g IV q48h  Height: 5\' 10"  (177.8 cm) Weight: 153 lb 8 oz (69.6 kg) IBW/kg (Calculated) : 73  Temp (24hrs), Avg:99 F (37.2 C), Min:98.3 F (36.8 C), Max:99.8 F (37.7 C)   Recent Labs Lab 04/28/16 0400 04/28/16 0411 04/28/16 0415  WBC  --   --  8.5  CREATININE 1.28*  --   --   LATICACIDVEN  --  0.99  --     Estimated Creatinine Clearance: 43.8 mL/min (A) (by C-G formula based on SCr of 1.28 mg/dL (H)).    Allergies  Allergen Reactions  . Avinza [Morphine Sulfate] Other (See Comments)    Altered mental status. "about killed him"  . Doxycycline Calcium Other (See Comments)    Causes patient chronic colitis; was hospitalized two weeks due to med.  . Vibramycin [Doxycycline Hyclate] Other (See Comments)    Causes patient colitis; was hospitalized two weeks due to med  . Flomax [Tamsulosin Hcl] Other (See Comments)    Dizziness  . Statins Other (See Comments)    Muscle pain; can tolerate crestor 10 mg every other day  . Codeine Sulfate Nausea And Vomiting  . Oxycodone Other (See Comments)    Angry; altered mental status; "wild and crazy"    Caryl Pina 04/28/2016 7:19 AM

## 2016-04-28 NOTE — ED Triage Notes (Signed)
Per EMS, pt from [facility] memory care unit with increased shortness of breath. Per EMS, pt has rhonchi to bilateral upper lung lobes, and productive cough. Pt can be combative. EMS vitals: BP- 136/84, HR-112, SpO2-90% room air.

## 2016-04-28 NOTE — ED Provider Notes (Signed)
TIME SEEN: 3:30 AM By signing my name below, I, Arianna Nassar, attest that this documentation has been prepared under the direction and in the presence of [employer], DO.  Electronically Signed: Julien Nordmann, ED Scribe. 04/28/16. 3:46 AM.  CHIEF COMPLAINT:  Chief Complaint  Patient presents with  . Shortness of Breath   LEVEL V CAVEAT: HPI and ROS limited due to pt has dementia.   HPI:  HPI Comments: [name] is a 83 y.o. male brought in by ambulance, who has a PMhx of dementia, abdominal aortic aneurysm, CAD, CKD, CABG x5 with EF of 60%, throat cancer, and HLD presents to the Emergency Department presenting with increased shortness of breath x today. He has an associated cough. Pt is a resident at [facility]. Per daughter, [facility] called her saying pt was running a fever of unknown temperature throughout the day. They note they went to go check on him and he began to have a "gurgling" sound so they advised pt to come to the ED to be evaluated. Pt is not on O2 at baseline.   ROS: Level V caveat for dementia  PAST MEDICAL HISTORY/PAST SURGICAL HISTORY:  Past Medical History:  Diagnosis Date  . Abdominal aortic aneurysm ([place])    a. 07/2014 Abd U/S: 3.61 cm, RCIA >50d.  Marland Kitchen Aphthous ulcer   . BPH (benign prostatic hypertrophy)   . CAD (coronary artery disease)    a. s/p CABG 1993;  b. cath 10/10: severe native 3v CAD, S-Dx ok, S-OM1/OM2 ok, S-PDA ok, L-LAD ok, EF 60%.  . Carotid stenosis    a. s/p Left CEA, followed by Dr. Donnetta Hutching;  b. 07/2014 u/s: RICA < 20, LICA patent.  . Cervical spondylosis without myelopathy   . CKD (chronic kidney disease), stage III   . GERD (gastroesophageal reflux disease)   . History of nephrolithiasis   . HOH (hard of hearing)    hearing aids  . Hyperlipidemia   . Memory deficits 06/19/2013  . Pre-syncope   . Radicular low back pain   . Throat cancer ([place])   . 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 Unspecified adverse  effect of unspecified drug, medicinal and biological substance     MEDICATIONS:  Prior to Admission medications   Medication Sig Start Date End Date Taking? Authorizing Provider  acetaminophen (TYLENOL) 500 MG tablet Take 2 tablets (1,000 mg total) by mouth every 8 (eight) hours. 09/29/15   Acquanetta Chain, DO  ALPHAGAN P 0.1 % SOLN Place 1 drop into both eyes at bedtime.  04/24/13   Historical Provider, MD  aspirin 81 MG tablet Take 1 tablet (81 mg total) by mouth daily. Pt takes 1 tablet by mouth on Mon and Fri. 09/29/15   Acquanetta Chain, DO  bacitracin 500 UNIT/GM ointment Apply 1 application topically 2 (two) times daily. To forehead laceration until healed 12/31/15   Duffy Bruce, MD  celecoxib (CELEBREX) 100 MG capsule Take 1 capsule (100 mg total) by mouth 2 (two) times daily. 09/29/15   Acquanetta Chain, DO  dutasteride (AVODART) 0.5 MG capsule Take 1 capsule (0.5 mg total) by mouth daily. 09/29/15   Acquanetta Chain, DO  escitalopram (LEXAPRO) 20 MG tablet TAKE ONE TABLET BY MOUTH ONCE DAILY Patient taking differently: TAKE ONE TABLET (20 mg) BY MOUTH ONCE DAILY 08/30/15   Marin Olp, MD  finasteride (PROSCAR) 5 MG tablet Take 5 mg by mouth daily. 11/12/15  Historical Provider, MD  levothyroxine (SYNTHROID, LEVOTHROID) 50 MCG tablet Take 50 mcg by mouth daily before breakfast.    Historical Provider, MD  LORazepam (ATIVAN) 1 MG tablet Take 0.5-1 tablets (0.5-1 mg total) by mouth 2 (two) times daily as needed (agitation/aggressive behavior). 11/29/15   Sherwood Gambler, MD  OLANZapine (ZYPREXA) 5 MG tablet Take 1 tablet (5 mg total) by mouth at bedtime. 09/29/15   Acquanetta Chain, DO  polyethylene glycol [facility] / GLYCOLAX) packet Take 17 g by mouth 2 (two) times daily. 09/29/15   Acquanetta Chain, DO  Vitamin D, Ergocalciferol, (DRISDOL) 50000 units CAPS capsule Take 50,000 Units by mouth See admin instructions. Patient takes Tuesday and Saturday    Historical  Provider, MD    ALLERGIES:  Allergies  Allergen Reactions  . Avinza [Morphine Sulfate] Other (See Comments)    Altered mental status. "about killed him"  . Doxycycline Calcium Other (See Comments)    Causes patient chronic colitis; was hospitalized two weeks due to med.  . Vibramycin [Doxycycline Hyclate] Other (See Comments)    Causes patient colitis; was hospitalized two weeks due to med  . Flomax [Tamsulosin Hcl] Other (See Comments)    Dizziness  . Statins Other (See Comments)    Muscle pain; can tolerate crestor 10 mg every other day  . Codeine Sulfate Nausea And Vomiting  . Oxycodone Other (See Comments)    Angry; altered mental status; "wild and crazy"    SOCIAL HISTORY:  Social History  Substance Use Topics  . Smoking status: Former Smoker    Packs/day: 1.00    Years: 30.00    Types: Cigarettes    Quit date: 01/17/1972  . Smokeless tobacco: Current User    Types: Chew  . Alcohol use No    FAMILY HISTORY: Family History  Problem Relation Age of Onset  . Heart disease Father   . Stroke Father   . AAA (abdominal aortic aneurysm) Sister   . AAA (abdominal aortic aneurysm) Brother   . Stroke Brother   . Coronary artery disease Brother   . Heart disease Brother   . Coronary artery disease      EXAM: BP 135/73   Pulse 71   Temp 98.9 F (37.2 C) (Rectal)   SpO2 98%  CONSTITUTIONAL:unable to respond to questions due to condition; elderly; Chronically ill-appearing HEAD: Normocephalic EYES: Conjunctivae clear, pupils appear equal, EOMI ENT: normal nose; moist mucous membranes NECK: Supple, no meningismus, no nuchal rigidity, no LAD  CARD: RRR; S1 and S2 appreciated; no murmurs, no clicks, no rubs, no gallops RESP: hypoxic, wet cough, no wheezing, rhonchorous breath sounds in all lung fields; NAD ABD/GI: Normal bowel sounds; non-distended; soft, non-tender, no rebound, no guarding, no peritoneal signs, no hepatosplenomegaly BACK:  The back appears normal and is  non-tender to palpation, there is no CVA tenderness EXT: Normal ROM in all joints; non-tender to palpation; no edema; normal capillary refill; no cyanosis, no calf tenderness or swelling    SKIN: Normal color for age and race; warm; no rash NEURO: Moves all extremities equally PSYCH: Patient agitated.  MEDICAL DECISION MAKING: Patient here with fevers, cough. Suspect pneumonia, possible influenza. No obvious sign of volume overload. History limited given patient's dementia. Family states he is a DO NOT RESUSCITATE/DO NOT INTUBATE but they would want treatment, admission.  ED PROGRESS: Labs unremarkable. Lactate normal. Troponin negative. Chest x-ray shows left lower lobe pneumonia. We'll give ceftriaxone, azithromycin. Patient has been taken off of oxygen. His agitation  and is doing well on room air. Family updated with plan.   5:44 AM Discussed patient's case with hospitalist, Dr. Hal Hope.  I have recommended admission and patient (and family if present) agree with this plan. Admitting physician will place admission orders.   I reviewed all nursing notes, vitals, pertinent previous records, EKGs, lab and urine results, imaging (as available).     EKG Interpretation  Date/Time:  Friday April 28 2016 05:41:30 EDT Ventricular Rate:  95 PR Interval:    QRS Duration: 78 QT Interval:  431 QTC Calculation: 542 R Axis:   81 Text Interpretation:  Sinus rhythm Borderline right axis deviation Borderline low voltage, extremity leads Nonspecific T abnormalities, lateral leads Prolonged QT interval Baseline wander in lead(s) V2 Confirmed by Jamiracle [employer]ants,  DO, Raford Brissett ([id number]) on 04/28/2016 5:44:08 AM        I personally performed the services described in this documentation, which was scribed in my presence. The recorded information has been reviewed and is accurate.    [place], DO 04/28/16 [phone]

## 2016-04-28 NOTE — Progress Notes (Addendum)
Called into pt room by Remo Lipps NT for low BP. Manual BP in R arm 88/39, HR 86 via telemetry. Paged Dr. Aggie Moats.  Verbal order for 1000cc 0.9% NS bolus. Attending Rn updated. Family at bedside updated. Will recheck pressure post bolus.  Fritz Pickerel, RN

## 2016-04-28 NOTE — ED Notes (Signed)
Pt's daughter at the bedside, pt calm at this time.

## 2016-04-28 NOTE — ED Notes (Signed)
Pt's SpO2- 90% on room air, pt placed on 2L [place], SpO2 improved to 94%.

## 2016-04-28 NOTE — ED Notes (Signed)
Delay in lab draw,  Need holding assistant.

## 2016-04-28 NOTE — Progress Notes (Signed)
[facility] MD notified of patient's blood pressure 120/62, HR 77, R 20 bladder scan volume was 176ml, O2 sat 95% on 4L. Patient resting quietly in bed no distress observed, daughters at bedside. MD was paged to see if we need to give 1037ml  normal saline bolus ordered, awaiting response and will continue to monitor.

## 2016-04-28 NOTE — H&P (Signed)
History and Physical    [name] YIA:[id number] DOB: April 30, 1931 DOA: 04/28/2016   PCP: Garret Reddish, MD   Patient coming from/Resides with: SNF  Admission status: Inpatient/telemetry-medically necessary to stay a minimum 2 midnights to rule out impending and/or unexpected changes in physiologic status that may differ from initial evaluation performed in the ER and/or at time of admission. Presents with fever, productive cough, mild acute delirium and x-ray concerning for early left basilar pneumonia. Resident of SNF so treat as HCAP. He is mildly hypoxemic and is currently requiring nasal cannula oxygen. Will require telemetry monitoring, broad-spectrum IV antibiotics, frequent nursing care regarding monitoring of hemodynamic status, intake and output, and frequent nursing safety rounds as well as safety sitter at bedside (family to provide) regarding poor impulse control in setting of dementia. Will also require inpatient palliative team evaluation for setting goals of care and patient may progress to comfort measures during this admission.  Chief Complaint: Shortness of breath and cough  HPI: [name] is a 83 y.o. male with medical history significant for remote history of throat cancer in the 1970s, CAD and peripheral vascular disease, dyslipidemia, hypertension with left ventricular diastolic dysfunction, hypothyroidism, GERD, dementia with behavioral disturbance all medical therapy, stage III chronic kidney disease who was sent on the nursing home after developing audible abnormal respiratory sounds and effort associated with productive cough with thick yellow sputum and reported shortness of breath. Upon arrival to the ER patient was found be mildly hypoxemic with room air saturations 90%. Chest x-ray concerning for early left basilar pneumonia. Patient did not have leukocytosis. Urinalysis mildly abnormal. Influenza PCR negative.  ED Course:  Vital Signs: BP (!) 139/92 (BP Location:  Right Leg)   Pulse (!) 108   Temp 99.8 F (37.7 C) (Axillary)   Resp 20   Ht '5\' 10"'  (1.778 m)   Wt 69.6 kg (153 lb 8 oz)   SpO2 96%   BMI 22.02 kg/m  PCXR: Left basilar airspace opacity concerning for pneumonia Lab data: Na 143, K 4.4, Cl 110, CO2 24, Gluc 90, BUN 27, Cr 1.28, Alb 2.9, BNP 318, TNO <0/03, WBC 8500, neutro 83%, abs neutro 7.1%, UA: sm Hgb, ketones 20, Leuko mod, protein 30, WBC 6-30; blood cultures and urine culture obtained in ER  Review of Systems:  In addition to the HPI above, (patient unable to provide due to underlying dementia-history obtained from the chart and family at bedside) No Headache, changes with Vision or hearing, new weakness, tingling, numbness in any extremity, dizziness, dysarthria or word finding difficulty, gait disturbance or imbalance, tremors or seizure activity No problems swallowing food or Liquids, indigestion/reflux, choking or coughing while eating, abdominal pain with or after eating No Chest pain, palpitations, orthopnea or DOE No Abdominal pain, N/V, melena,hematochezia, dark tarry stools, constipation No dysuria, malodorous urine, hematuria or flank pain No new skin rashes, lesions, masses or bruises, No new joint pains, aches, swelling or redness No recent unintentional weight gain or loss No polyuria, polydypsia or polyphagia   Past Medical History:  Diagnosis Date  . Abdominal aortic aneurysm ([place])    a. 07/2014 Abd U/S: 3.61 cm, RCIA >50d.  Marland Kitchen Aphthous ulcer   . BPH (benign prostatic hypertrophy)   . CAD (coronary artery disease)    a. s/p CABG 1993;  b. cath 10/10: severe native 3v CAD, S-Dx ok, S-OM1/OM2 ok, S-PDA ok, L-LAD ok, EF 60%.  . Carotid stenosis    a. s/p Left CEA, followed by Dr.  Early;  b. 07/2014 u/s: RICA < 20, LICA patent.  . Cervical spondylosis without myelopathy   . CKD (chronic kidney disease), stage III   . GERD (gastroesophageal reflux disease)   . History of nephrolithiasis   . HOH (hard of hearing)      hearing aids  . Hyperlipidemia   . Memory deficits 06/19/2013  . Pre-syncope   . Radicular low back pain   . Throat cancer ([place])   . 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 Unspecified adverse effect of unspecified drug, medicinal and biological substance     Past Surgical History:  Procedure Laterality Date  . BACK SURGERY    . CARDIAC CATHETERIZATION    . CAROTID ENDARTERECTOMY Left   . CHOLECYSTECTOMY    . CORONARY ARTERY BYPASS GRAFT    . CORONARY ARTERY BYPASS GRAFT  1998  . HERNIA REPAIR     unclear which side  . INGUINAL HERNIA REPAIR Right 09/29/2013   Procedure: HERNIA REPAIR INGUINAL ADULT;  Surgeon: Rolm Bookbinder, MD;  Location: [facility];  Service: General;  Laterality: Right;  . INSERTION OF MESH Right 09/29/2013   Procedure: INSERTION OF MESH;  Surgeon: Rolm Bookbinder, MD;  Location: [facility];  Service: General;  Laterality: Right;  . PROSTATE SURGERY    . renal stone surgery     3 times  . THROAT SURGERY      Social History   Social History  . Marital status: Widowed    Spouse name: Everlene Farrier   . Number of children: 3  . Years of education: 11th   Occupational History  . Retired    Social History Main Topics  . Smoking status: Former Smoker    Packs/day: 1.00    Years: 30.00    Types: Cigarettes    Quit date: 01/17/1972  . Smokeless tobacco: Current User    Types: Chew  . Alcohol use No  . Drug use: No  . Sexual activity: Not Currently   Other Topics Concern  . Not on file   Social History Narrative   Married to Ms. Iannacone who is patient of Dr. Yong Channel. Lives with wife. Married 1953       3 girls (1 is a patient here-[name]). 2 grandkids. Daughter Neoma Laming checks in most days. Other daughter Lynelle Smoke has also been to visits.       Retired from [employer]: mowing the yard, time with wife, church sparingly      Patient drinks about 4-5 sodas daily.   Patient is right handed.    Mobility: Impulsive and requires  assistance of healthcare personnel Work history: Not obtained   Allergies  Allergen Reactions  . Avinza [Morphine Sulfate] Other (See Comments)    Altered mental status. "about killed him"  . Doxycycline Calcium Other (See Comments)    Causes patient chronic colitis; was hospitalized two weeks due to med.  . Vibramycin [Doxycycline Hyclate] Other (See Comments)    Causes patient colitis; was hospitalized two weeks due to med  . Flomax [Tamsulosin Hcl] Other (See Comments)    Dizziness  . Statins Other (See Comments)    Muscle pain; can tolerate crestor 10 mg every other day  . Codeine Sulfate Nausea And Vomiting  . Oxycodone Other (See Comments)    Angry; altered mental status; "wild and crazy"    Family History  Problem Relation Age of Onset  . Heart disease Father   . Stroke  Father   . AAA (abdominal aortic aneurysm) Sister   . AAA (abdominal aortic aneurysm) Brother   . Stroke Brother   . Coronary artery disease Brother   . Heart disease Brother   . Coronary artery disease       Prior to Admission medications   Medication Sig Start Date End Date Taking? Authorizing Provider  acetaminophen (TYLENOL) 500 MG tablet Take 2 tablets (1,000 mg total) by mouth every 8 (eight) hours. 09/29/15  Yes Acquanetta Chain, DO  ALPHAGAN P 0.1 % SOLN Place 1 drop into both eyes at bedtime.  04/24/13  Yes Historical Provider, MD  aspirin 81 MG tablet Take 1 tablet (81 mg total) by mouth daily. Pt takes 1 tablet by mouth on Mon and Fri. 09/29/15  Yes Acquanetta Chain, DO  celecoxib (CELEBREX) 100 MG capsule Take 1 capsule (100 mg total) by mouth 2 (two) times daily. 09/29/15  Yes Acquanetta Chain, DO  divalproex (DEPAKOTE SPRINKLE) 125 MG capsule Take 500 mg by mouth 2 (two) times daily.   Yes Historical Provider, MD  escitalopram (LEXAPRO) 20 MG tablet TAKE ONE TABLET BY MOUTH ONCE DAILY 08/30/15  Yes Marin Olp, MD  finasteride (PROSCAR) 5 MG tablet Take 5 mg by mouth daily.  11/12/15  Yes Historical Provider, MD  levothyroxine (SYNTHROID, LEVOTHROID) 50 MCG tablet Take 50 mcg by mouth daily before breakfast.   Yes Historical Provider, MD  LORazepam (ATIVAN) 1 MG tablet Take 0.5-1 tablets (0.5-1 mg total) by mouth 2 (two) times daily as needed (agitation/aggressive behavior). 11/29/15  Yes Sherwood Gambler, MD  OLANZapine (ZYPREXA) 5 MG tablet Take 1 tablet (5 mg total) by mouth at bedtime. 09/29/15  Yes Acquanetta Chain, DO  polyethylene glycol (MIRALAX / GLYCOLAX) packet Take 17 g by mouth 2 (two) times daily. 09/29/15  Yes Acquanetta Chain, DO  traZODone (DESYREL) 50 MG tablet Take 50 mg by mouth at bedtime.   Yes Historical Provider, MD  Vitamin D, Ergocalciferol, (DRISDOL) 50000 units CAPS capsule Take 50,000 Units by mouth See admin instructions. Patient takes Tuesday and Saturday   Yes Historical Provider, MD    Physical Exam: Vitals:   04/28/16 0444 04/28/16 0512 04/28/16 0530 04/28/16 0646  BP:  140/81 122/76 (!) 139/92  Pulse: 94 95 89 (!) 108  Resp: (!) 22 (!) 24 (!) 24 20  Temp:    99.8 F (37.7 C)  TempSrc:    Axillary  SpO2: 98% 95% 96%   Weight:    69.6 kg (153 lb 8 oz)  Height:    '5\' 10"'  (1.778 m)      Constitutional: NAD, Restless, appears to be comfortable Eyes: PERRL, lids and conjunctivae normal ENMT: Mucous membranes are dry. Posterior pharynx clear of any exudate or lesions.Normal dentition.  Neck: normal, supple, no masses, no thyromegaly Respiratory: Coarse to auscultation bilaterally Normal respiratory effort. No accessory muscle use. Wet sounding cough-family reports thick yellow sputum Cardiovascular: Regular rate and rhythm, no murmurs / rubs / gallops. No extremity edema. 2+ pedal pulses. No carotid bruits.  Abdomen: no tenderness, no masses palpated. No hepatosplenomegaly. Bowel sounds positive.  Musculoskeletal: no clubbing / cyanosis. No joint deformity upper and lower extremities. Good ROM, no contractures. Normal muscle  tone.  Skin: no rashes, lesions, ulcers. No induration Neurologic: CN 2-12 grossly intact on visual inspection. Sensation appears to be grossly intact based on limited exam, did not test DTRs or straining secondary to limiting interaction with patient at time of  evaluation Psychiatric: Sleeping. He has been quite restless and agitated since arrival to the hospital and family requested that we not waken patient at this time.   Labs on Admission: I have personally reviewed following labs and imaging studies  CBC:  Recent Labs Lab 04/28/16 0415  WBC 8.5  NEUTROABS 7.1  HGB 13.3  HCT 40.8  MCV 96.9  PLT 446   Basic Metabolic Panel:  Recent Labs Lab 04/28/16 0400  NA 143  K 4.4  CL 110  CO2 24  GLUCOSE 90  BUN 27*  CREATININE 1.28*  CALCIUM 8.6*   GFR: Estimated Creatinine Clearance: 43.8 mL/min (A) (by C-G formula based on SCr of 1.28 mg/dL (H)). Liver Function Tests:  Recent Labs Lab 04/28/16 0400  AST 19  ALT 13*  ALKPHOS 61  BILITOT 0.7  PROT 5.8*  ALBUMIN 2.9*   No results for input(s): LIPASE, AMYLASE in the last 168 hours. No results for input(s): AMMONIA in the last 168 hours. Coagulation Profile: No results for input(s): INR, PROTIME in the last 168 hours. Cardiac Enzymes:  Recent Labs Lab 04/28/16 0400  TROPONINI <0.03   BNP (last 3 results) No results for input(s): PROBNP in the last 8760 hours. HbA1C: No results for input(s): HGBA1C in the last 72 hours. CBG: No results for input(s): GLUCAP in the last 168 hours. Lipid Profile: No results for input(s): CHOL, HDL, LDLCALC, TRIG, CHOLHDL, LDLDIRECT in the last 72 hours. Thyroid Function Tests: No results for input(s): TSH, T4TOTAL, FREET4, T3FREE, THYROIDAB in the last 72 hours. Anemia Panel: No results for input(s): VITAMINB12, FOLATE, FERRITIN, TIBC, IRON, RETICCTPCT in the last 72 hours. Urine analysis:    Component Value Date/Time   COLORURINE YELLOW 04/28/2016 0509   APPEARANCEUR  CLEAR 04/28/2016 0509   LABSPEC 1.019 04/28/2016 0509   PHURINE 6.0 04/28/2016 0509   GLUCOSEU NEGATIVE 04/28/2016 0509   HGBUR SMALL (A) 04/28/2016 0509   BILIRUBINUR NEGATIVE 04/28/2016 0509   KETONESUR 20 (A) 04/28/2016 0509   PROTEINUR 30 (A) 04/28/2016 0509   UROBILINOGEN 0.2 10/01/2013 1545   NITRITE NEGATIVE 04/28/2016 0509   LEUKOCYTESUR MODERATE (A) 04/28/2016 0509   Sepsis Labs: '@LABRCNTIP' (procalcitonin:4,lacticidven:4) )No results found for this or any previous visit (from the past 240 hour(s)).   Radiological Exams on Admission: Dg Chest Portable 1 View  Result Date: 04/28/2016 CLINICAL DATA:  Acute onset of fever and shortness of breath. Initial encounter. EXAM: PORTABLE CHEST 1 VIEW COMPARISON:  Chest radiograph performed 11/15/2015 FINDINGS: The lungs are well-aerated. Left basilar airspace opacity raises concern for pneumonia. There is no evidence of pleural effusion or pneumothorax. The cardiomediastinal silhouette is borderline normal in size. The patient is status post median sternotomy. Cervical spinal fusion hardware is noted. No acute osseous abnormalities are seen. IMPRESSION: Left basilar airspace opacity raises concern for pneumonia. Electronically Signed   By: Garald Balding M.D.   On: 04/28/2016 04:27    EKG: (Independently reviewed) sinus rhythm with ventricular rate 95 bpm, QTC 542 ms, no acute ischemic changes  Assessment/Plan Principal Problem:   HCAP (healthcare-associated pneumonia) -Presents with recent fevers, productive cough yellow sputum and abnormal chest x-ray concerning for pneumonia process -Influenza PCR negative but full viral panel not obtained therefore cannot completely exclude viral etiology -Broad spectrum IV antibiotics: Maxipime/vancomycin -Urinary strep and Legionella -Procalcitonin and ESR -Supportive care (see below) -Follow up on blood cultures -Possible aspiration component-the below  Active Problems:   CKD (chronic kidney  disease), stage III w/ Acute kidney injury  -  Baseline: 11 and 0.98 -Current: 27 and 1.28 -Gentle IV fluid hydration -Avoid offending medications    Acute respiratory failure with hypoxia  -Likely from acute URI -Mildly elevated BNP > 300 and overall does not look volume overloaded- chest x-ray does not support volume overload -Continue nasal cannula oxygen    Abnormal urinalysis -Likely secondary to volume depletion -Follow-up on urine culture -UTI would be covered by above antibiotics    Vascular dementia with behavior disturbance/FTT -Family noted some mild increase from baseline confusion as well as increased agitation consistent with acute delirium-suspect precipitated by acute infection and mild hypoxemia -Review of chart reveals patient has lost significant amount of weight noting he weighed 195 lbs in September 2017 and today's weight is 152 lbs -Recent pneumonia 1 month ago therefore question possible aspiration component presenting symptoms-SLP for swallowing evaluation -Discussed with daughter who is HCPOA and agree focus should be on comfort although at this juncture as long as attempts to treat PNA with antibiotics/ fluids and other treatments are not causing harm or agitation to patient will pursue -Daughter at bedside who is POA as well as daughter who is HCPOA both agree that palliative evaluation to set goals of care regarding future hospitalizations etc. is appropriate - I have consulted palliative care -Family mentions patient has poor impulse control and they are planning on providing 24/7 sitters -Although concerned about swallowing focus will be on comfort with no desire to place feeding tube therefore we'll allow regular diet as well as oral medications -Continue preadmission Celebrex, Depakote, Lexapro, Ativan and Zyprexa as well as trazodone -Patient does have slightly prolonged QT interval but benefit to utilizing above medication outweighs risk at this juncture     Essential hypertension -Not on antihypertensive medications prior to admission    GERD -Not on H2 blocker or PPI prior to admission    Left ventricular diastolic dysfunction -Noted on echocardiogram 2015 and has apparently never been symptomatic therefore not on ACE inhibitor/ARB or diuretics -Given mildly elevated BNP and obtain echocardiogram this admission    Hypothyroidism -Continue Synthroid -Check TSH    Hyperlipidemia -Not on medications prior to admission    CAD (coronary artery disease) s/p bypass graft -Asymptomatic -Continue baby aspirin    BPH (benign prostatic hyperplasia) -Continue pro-scar -Monitor for acute urinary retention specially with administration of IV fluids       DVT prophylaxis: Lovenox Code Status: DO NOT RESUSCITATE Family Communication: Daughters (see above) Disposition Plan: SNF Consults called: Palliative medicine     Shakeyla Giebler L. ANP-BC [facility] Pager [phone]   If 7PM-7AM, please contact night-coverage [email or web address] Password [facility]  04/28/2016, 7:58 AM

## 2016-04-29 ENCOUNTER — Inpatient Hospital Stay (HOSPITAL_COMMUNITY): Payer: Medicare Other

## 2016-04-29 DIAGNOSIS — J9601 Acute respiratory failure with hypoxia: Secondary | ICD-10-CM

## 2016-04-29 LAB — BASIC METABOLIC PANEL
Anion gap: 14 (ref 5–15)
BUN: 23 mg/dL — AB (ref 6–20)
CO2: 20 mmol/L — ABNORMAL LOW (ref 22–32)
CREATININE: 1.49 mg/dL — AB (ref 0.61–1.24)
Calcium: 8.5 mg/dL — ABNORMAL LOW (ref 8.9–10.3)
Chloride: 113 mmol/L — ABNORMAL HIGH (ref 101–111)
GFR, EST AFRICAN AMERICAN: 49 mL/min — AB (ref >60)
GFR, EST NON AFRICAN AMERICAN: 42 mL/min — AB (ref >60)
Glucose, Bld: 90 mg/dL (ref 65–99)
POTASSIUM: 5.1 mmol/L (ref 3.5–5.1)
SODIUM: 147 mmol/L — AB (ref 135–145)

## 2016-04-29 LAB — LEGIONELLA PNEUMOPHILA SEROGP 1 UR AG: L. pneumophila Serogp 1 Ur Ag: NEGATIVE

## 2016-04-29 LAB — CBC
HCT: 39.1 % (ref 39.0–52.0)
Hemoglobin: 12.3 g/dL — ABNORMAL LOW (ref 13.0–17.0)
MCH: 31.5 pg (ref 26.0–34.0)
MCHC: 31.5 g/dL (ref 30.0–36.0)
MCV: 100 fL (ref 78.0–100.0)
PLATELETS: 170 10*3/uL (ref 150–400)
RBC: 3.91 MIL/uL — AB (ref 4.22–5.81)
RDW: 15.1 % (ref 11.5–15.5)
WBC: 11.5 10*3/uL — AB (ref 4.0–10.5)

## 2016-04-29 LAB — URINE CULTURE

## 2016-04-29 MED ORDER — PIPERACILLIN-TAZOBACTAM 3.375 G IVPB 30 MIN
3.3750 g | Freq: Once | INTRAVENOUS | Status: AC
  Administered 2016-04-29: 3.375 g via INTRAVENOUS
  Filled 2016-04-29: qty 50

## 2016-04-29 MED ORDER — VANCOMYCIN HCL IN DEXTROSE 1-5 GM/200ML-% IV SOLN
1000.0000 mg | INTRAVENOUS | Status: DC
  Administered 2016-04-29: 1000 mg via INTRAVENOUS
  Filled 2016-04-29 (×2): qty 200

## 2016-04-29 MED ORDER — PIPERACILLIN-TAZOBACTAM 3.375 G IVPB
3.3750 g | Freq: Three times a day (TID) | INTRAVENOUS | Status: DC
  Administered 2016-04-29 – 2016-05-01 (×5): 3.375 g via INTRAVENOUS
  Filled 2016-04-29 (×6): qty 50

## 2016-04-29 MED ORDER — SODIUM CHLORIDE 0.9 % IV BOLUS (SEPSIS)
1000.0000 mL | Freq: Once | INTRAVENOUS | Status: AC
  Administered 2016-04-29: 1000 mL via INTRAVENOUS

## 2016-04-29 NOTE — Progress Notes (Addendum)
Pharmacy Antibiotic Note  [name] is a 83 y.o. male admitted on 04/28/2016 with SOB/Cough, possible HCAP.  Pharmacy has been consulted for Vancomycin  Dosing. He is also noted on cefepime to change to zosyn  -WBC= 11.5, tmax= 102.4, PCT < 0.1 -SCr= 1.28 > 1.49(baseline ~ 1.1)  Plan: Change vancomycin to 1000mg  IV q24hr Zosyn 3.375gm IV q8h Will follow renal function, cultures and clinical progress   Height: 5\' 10"  (177.8 cm) Weight: 167 lb 11.2 oz (76.1 kg) IBW/kg (Calculated) : 73  Temp (24hrs), Avg:100.1 F (37.8 C), Min:98 F (36.7 C), Max:102.4 F (39.1 C)   Recent Labs Lab 04/28/16 0400 04/28/16 0411 04/28/16 0415 04/29/16 0316  WBC  --   --  8.5 11.5*  CREATININE 1.28*  --   --  1.49*  LATICACIDVEN  --  0.99  --   --     Estimated Creatinine Clearance: 39.5 mL/min (A) (by C-G formula based on SCr of 1.49 mg/dL (H)).    Allergies  Allergen Reactions  . Avinza [Morphine Sulfate] Other (See Comments)    Altered mental status. "about killed him"  . Doxycycline Calcium Other (See Comments)    Causes patient chronic colitis; was hospitalized two weeks due to med.  . Vibramycin [Doxycycline Hyclate] Other (See Comments)    Causes patient colitis; was hospitalized two weeks due to med  . Flomax [Tamsulosin Hcl] Other (See Comments)    Dizziness  . Statins Other (See Comments)    Muscle pain; can tolerate crestor 10 mg every other day  . Codeine Sulfate Nausea And Vomiting  . Oxycodone Other (See Comments)    Angry; altered mental status; "wild and crazy"   Hildred Laser, Pharm D 04/29/2016 10:15 AM

## 2016-04-29 NOTE — Progress Notes (Addendum)
PROGRESS NOTE    [name]  TJQ:[id number] DOB: [birth year] DOA: 04/28/2016 PCP: Garret Reddish, MD     Brief Narrative:  [name] is a 83 y.o. male with medical history significant for remote history of throat cancer in the 1970s, CAD and peripheral vascular disease, dyslipidemia, hypertension with left ventricular diastolic dysfunction, hypothyroidism, GERD, dementia with behavioral disturbance all medical therapy, stage III chronic kidney disease who was sent from the nursing home after developing audible abnormal respiratory sounds and effort associated with productive cough with thick yellow sputum and reported shortness of breath. Upon arrival to the ER patient was found be mildly hypoxemic with room air saturations 90%. Chest x-ray concerning for early left basilar pneumonia.   Assessment & Plan:   Principal Problem:   HCAP (healthcare-associated pneumonia) Active Problems:   Hyperlipidemia   Essential hypertension   CAD (coronary artery disease) s/p bypass graft   GERD   BPH (benign prostatic hyperplasia)   CKD (chronic kidney disease), stage III   Vascular dementia with behavior disturbance   Acute kidney injury ([place])   Acute respiratory failure with hypoxia ([facility])   Left ventricular diastolic dysfunction   Hypothyroidism   Abnormal urinalysis  Acute respiratory failure with hypoxia  -Continue nasal cannula oxygen as needed   Sepsis secondary to HCAP -Repeat chest x-ray obtained today, with worsening multilobar bronchopneumonia, most severe in the left lower lobe -Blood cultures pending -Continue supportive care, broad-spectrum antibiotic vanco/zosyn  -SLP: moderate dysphagia risk   AKI on CKD stage 3  -Baseline Cr 0.98 -IVF   Vascular dementia with behavior disturbance/FTT -Family mentions patient has poor impulse control and they are planning on providing 24/7 sitters -Continue preadmission Celebrex, Depakote, Lexapro, Ativan and Zyprexa,  trazodone  Left ventricular diastolic dysfunction -Noted on echocardiogram 2015 and has apparently never been symptomatic therefore not on ACE inhibitor/ARB or diuretics -Given mildly elevated BNP and obtain echocardiogram this admission  Hypothyroidism -Continue Synthroid  CAD (coronary artery disease) s/p bypass graft -Continue baby aspirin  BPH (benign prostatic hyperplasia) -Continue pro-scar   DVT prophylaxis: lovenox  Code Status: DNR Family Communication: Discussed with daughter over the phone today. They remain realistic of expectations for patient's improvement. Continue non-aggressive medical therapies to see improvement. Palliative care consulted at time of admission Disposition Plan: Pending improvement, SNF  Consultants:   Palliative care  Procedures:   None  Antimicrobials:  Anti-infectives    Start     Dose/Rate Route Frequency Ordered Stop   04/30/16 0600  vancomycin (VANCOCIN) IVPB 1000 mg/200 mL premix  Status:  Discontinued     1,000 mg 200 mL/hr over 60 Minutes Intravenous Every 48 hours 04/28/16 0722 04/29/16 1014   04/29/16 1100  vancomycin (VANCOCIN) IVPB 1000 mg/200 mL premix     1,000 mg 200 mL/hr over 60 Minutes Intravenous Every 24 hours 04/29/16 1014     04/28/16 0800  vancomycin (VANCOCIN) 1,250 mg in sodium chloride 0.9 % 250 mL IVPB     1,250 mg 166.7 mL/hr over 90 Minutes Intravenous  Once 04/28/16 0722 04/28/16 1205   04/28/16 0730  ceFEPIme (MAXIPIME) 2 g in dextrose 5 % 50 mL IVPB  Status:  Discontinued     2 g 100 mL/hr over 30 Minutes Intravenous Every 24 hours 04/28/16 0716 04/29/16 1132   04/28/16 0445  cefTRIAXone (ROCEPHIN) 1 g in dextrose 5 % 50 mL IVPB     1 g 100 mL/hr over 30 Minutes Intravenous  Once 04/28/16 0433 04/28/16 0538  04/28/16 0445  azithromycin (ZITHROMAX) 500 mg in dextrose 5 % 250 mL IVPB     500 mg 250 mL/hr over 60 Minutes Intravenous  Once 04/28/16 0433 04/28/16 0804         Subjective: Patient  is very somnolent on examination. Does awaken eyes to voice. Sitter at bedside state that he has not had much rest overnight. No acute events overnight.  Objective: Vitals:   04/28/16 2025 04/29/16 0110 04/29/16 0156 04/29/16 0455  BP: (!) 114/51 (!) 88/63 (!) 84/45 107/78  Pulse: 73 75 72 94  Resp: 20  20 (!) 22  Temp: 99.9 F (37.7 C)  99.4 F (37.4 C) (!) 101.2 F (38.4 C)  TempSrc: Axillary  Axillary Axillary  SpO2: 97%  93% 94%  Weight:    76.1 kg (167 lb 11.2 oz)  Height:        Intake/Output Summary (Last 24 hours) at 04/29/16 1133 Last data filed at 04/29/16 0647  Gross per 24 hour  Intake          3236.25 ml  Output              625 ml  Net          2611.25 ml   Filed Weights   04/28/16 0646 04/29/16 0455  Weight: 69.6 kg (153 lb 8 oz) 76.1 kg (167 lb 11.2 oz)    Examination:  General exam: Appears calm and comfortable, sleeping  Respiratory system: Coarse. Respiratory effort normal. Cardiovascular system: S1 & S2 heard, RRR. No JVD, murmurs, rubs, gallops or clicks. No pedal edema. Gastrointestinal system: Abdomen is nondistended, soft and nontender.  Central nervous system: Alert to voice, somnolent otherwise  Extremities: Symmetric in appearance  Skin: No rashes, lesions or ulcers on exposed skin   Data Reviewed: I have personally reviewed following labs and imaging studies  CBC:  Recent Labs Lab 04/28/16 0415 04/29/16 0316  WBC 8.5 11.5*  NEUTROABS 7.1  --   HGB 13.3 12.3*  HCT 40.8 39.1  MCV 96.9 100.0  PLT 161 212   Basic Metabolic Panel:  Recent Labs Lab 04/28/16 0400 04/29/16 0316  NA 143 147*  K 4.4 5.1  CL 110 113*  CO2 24 20*  GLUCOSE 90 90  BUN 27* 23*  CREATININE 1.28* 1.49*  CALCIUM 8.6* 8.5*   GFR: Estimated Creatinine Clearance: 39.5 mL/min (A) (by C-G formula based on SCr of 1.49 mg/dL (H)). Liver Function Tests:  Recent Labs Lab 04/28/16 0400  AST 19  ALT 13*  ALKPHOS 61  BILITOT 0.7  PROT 5.8*  ALBUMIN 2.9*    No results for input(s): LIPASE, AMYLASE in the last 168 hours. No results for input(s): AMMONIA in the last 168 hours. Coagulation Profile: No results for input(s): INR, PROTIME in the last 168 hours. Cardiac Enzymes:  Recent Labs Lab 04/28/16 0400  TROPONINI <0.03   BNP (last 3 results) No results for input(s): PROBNP in the last 8760 hours. HbA1C: No results for input(s): HGBA1C in the last 72 hours. CBG: No results for input(s): GLUCAP in the last 168 hours. Lipid Profile: No results for input(s): CHOL, HDL, LDLCALC, TRIG, CHOLHDL, LDLDIRECT in the last 72 hours. Thyroid Function Tests:  Recent Labs  04/28/16 0942  TSH 3.719   Anemia Panel: No results for input(s): VITAMINB12, FOLATE, FERRITIN, TIBC, IRON, RETICCTPCT in the last 72 hours. Sepsis Labs:  Recent Labs Lab 04/28/16 0411 04/28/16 0746  PROCALCITON  --  <0.10  LATICACIDVEN 0.99  --  Recent Results (from the past 240 hour(s))  Blood culture (routine x 2)     Status: None (Preliminary result)   Collection Time: 04/28/16  4:00 AM  Result Value Ref Range Status   Specimen Description BLOOD RIGHT HAND  Final   Special Requests IN PEDIATRIC BOTTLE Blood Culture adequate volume  Final   Culture NO GROWTH 1 DAY  Final   Report Status PENDING  Incomplete  Blood culture (routine x 2)     Status: None (Preliminary result)   Collection Time: 04/28/16  4:15 AM  Result Value Ref Range Status   Specimen Description BLOOD RIGHT FOREARM  Final   Special Requests   Final    BOTTLES DRAWN AEROBIC AND ANAEROBIC Blood Culture adequate volume   Culture NO GROWTH 1 DAY  Final   Report Status PENDING  Incomplete  Urine culture     Status: Abnormal   Collection Time: 04/28/16  5:09 AM  Result Value Ref Range Status   Specimen Description URINE, CATHETERIZED  Final   Special Requests NONE  Final   Culture <10,000 COLONIES/mL INSIGNIFICANT GROWTH (A)  Final   Report Status 04/29/2016 FINAL  Final  MRSA PCR  Screening     Status: None   Collection Time: 04/28/16 11:28 AM  Result Value Ref Range Status   MRSA by PCR NEGATIVE NEGATIVE Final    Comment:        The GeneXpert MRSA Assay (FDA approved for NASAL specimens only), is one component of a comprehensive MRSA colonization surveillance program. It is not intended to diagnose MRSA infection nor to guide or monitor treatment for MRSA infections.        Radiology Studies: Dg Chest 2 View  Result Date: 04/29/2016 CLINICAL DATA:  83 year old male with history of respiratory failure with hypoxia. EXAM: CHEST  2 VIEW COMPARISON:  Chest x-ray 04/28/2016. FINDINGS: Lung volumes are low. Patchy multifocal interstitial and airspace disease throughout the mid to lower lungs bilaterally, asymmetrically distributed, most severe on the left side. Trace left pleural effusion. Mild crowding of the pulmonary vasculature, without frank pulmonary edema. Heart size is upper limits of normal. The patient is rotated to the right on today's exam, resulting in distortion of the mediastinal contours and reduced diagnostic sensitivity and specificity for mediastinal pathology. Aortic atherosclerosis. IMPRESSION: 1. Overall, the appearance the chest suggests worsening multilobar bronchopneumonia, most severe in the left lower lobe, now with a trace left pleural effusion. 2. Aortic atherosclerosis. Electronically Signed   By: Vinnie Langton M.D.   On: 04/29/2016 08:24   Dg Chest Portable 1 View  Result Date: 04/28/2016 CLINICAL DATA:  Acute onset of fever and shortness of breath. Initial encounter. EXAM: PORTABLE CHEST 1 VIEW COMPARISON:  Chest radiograph performed 11/15/2015 FINDINGS: The lungs are well-aerated. Left basilar airspace opacity raises concern for pneumonia. There is no evidence of pleural effusion or pneumothorax. The cardiomediastinal silhouette is borderline normal in size. The patient is status post median sternotomy. Cervical spinal fusion hardware  is noted. No acute osseous abnormalities are seen. IMPRESSION: Left basilar airspace opacity raises concern for pneumonia. Electronically Signed   By: Garald Balding M.D.   On: 04/28/2016 04:27      Scheduled Meds: . aspirin  81 mg Oral Daily  . brimonidine  1 drop Both Eyes QHS  . celecoxib  100 mg Oral BID  . divalproex  500 mg Oral BID  . enoxaparin (LOVENOX) injection  40 mg Subcutaneous Q24H  . escitalopram  20 mg  Oral Daily  . feeding supplement (ENSURE ENLIVE)  237 mL Oral BID BM  . finasteride  5 mg Oral Daily  . levothyroxine  50 mcg Oral QAC breakfast  . OLANZapine  5 mg Oral QHS  . polyethylene glycol  17 g Oral BID  . sodium chloride  1,000 mL Intravenous Once  . traZODone  50 mg Oral QHS  . vancomycin  1,000 mg Intravenous Q24H  . Vitamin D (Ergocalciferol)  50,000 Units Oral Once per day on Tue Sat   Continuous Infusions: . sodium chloride 125 mL/hr at 04/29/16 0407     LOS: 1 day    Time spent: 40 minutes   Dessa Phi, DO [facility] [email or web address] Password [facility] 04/29/2016, 11:33 AM

## 2016-04-29 NOTE — Progress Notes (Signed)
[facility] notified about 6beat run of [place]. No new orders at this time.  Pt resting in bed. VS checked. Bladder scanned pt due to low urine output. Jonette Eva notified again of pt condition. Notified of patient's blood pressure 86/43, HR 77,  bladder scan volume was 260ml, intermitent cath volume 375cc. Patient resting quietly in bed no distress observed, caregiver at bedside. M Lynch ordered 500 cc bolus. Pt BP still in the 80s after bolus. No distress, resting. per M. Lynch to just continue to monitor patient. No further orders at this time.

## 2016-04-29 NOTE — Progress Notes (Signed)
Initial Nutrition Assessment  DOCUMENTATION CODES:   Not applicable  INTERVENTION:  Continue Ensure Enlive po TID, each supplement provides 350 kcal and 20 grams of protein.  Encourage adequate PO intake.   NUTRITION DIAGNOSIS:   Inadequate oral intake related to lethargy/confusion as evidenced by meal completion < 25%.  GOAL:   Patient will meet greater than or equal to 90% of their needs  MONITOR:   PO intake, Supplement acceptance, Labs, Weight trends, Skin, I & O's  REASON FOR ASSESSMENT:   Malnutrition Screening Tool    ASSESSMENT:   83 y.o. male with medical history significant for remote history of throat cancer in the 1970s, CAD and peripheral vascular disease, dyslipidemia, hypertension with left ventricular diastolic dysfunction, hypothyroidism, GERD, dementia with behavioral disturbance all medical therapy, stage III chronic kidney disease who was sent from the nursing home after developing audible abnormal respiratory sounds and effort associated with productive cough with thick yellow sputum and reported shortness of breath. Upon arrival to the ER patient was found be mildly hypoxemic with room air saturations 90%. Chest x-ray concerning for early left basilar pneumonia.   Caregiver at bedside. Pt was asleep and did not awaken to RD assessment. Caregiver reports pt was eating well PTA with at least 3 meals a day on a regular consistency diet with no other difficulties. Caregiver unaware of any changes in weight. Per weight records, pt with a 12% weight loss in 7 months. Pt is currently on a dysphagia 1 diet with thin liquids. Pt was complains of swallowing difficulties on admission. Per SLP, pt with moderate aspiration risk. Noted pt was lethargic and confused during the swallow evaluation. No recent meal completion recorded. Caregiver reports 10% po intake. Pt currently has Ensure ordered. RD to increase Ensure to TID to aid in caloric and protein needs. Per MD note,  current plans to continue non-aggressive medical therapies to see improvement. Palliative care has been consulted.   Nutrition-Focused physical exam completed. Findings are mild to moderate fat depletion, mild to moderate muscle depletion, and no edema. Depletion likely related to the natural aging process.  Labs and medications reviewed. Sodium elevated at 147. Chloride elevated at 113. IV fluids are infusing.   Diet Order:  DIET - DYS 1 Room service appropriate? Yes; Fluid consistency: Thin  Skin:  Reviewed, no issues  Last BM:  Unknown  Height:   Ht Readings from Last 1 Encounters:  04/28/16 5\' 10"  (1.778 m)    Weight:   Wt Readings from Last 1 Encounters:  04/29/16 167 lb 11.2 oz (76.1 kg)    Ideal Body Weight:  75.45 kg  BMI:  Body mass index is 24.06 kg/m.  Estimated Nutritional Needs:   Kcal:  1700-1900  Protein:  75-85 grams  Fluid:  1.7 - 1.9 L/day  EDUCATION NEEDS:   No education needs identified at this time  Corrin Parker, MS, RD, LDN Pager # [phone] After hours/ weekend pager # [phone]

## 2016-04-30 ENCOUNTER — Inpatient Hospital Stay (HOSPITAL_COMMUNITY): Payer: Medicare Other

## 2016-04-30 DIAGNOSIS — R0600 Dyspnea, unspecified: Secondary | ICD-10-CM

## 2016-04-30 DIAGNOSIS — J189 Pneumonia, unspecified organism: Secondary | ICD-10-CM

## 2016-04-30 LAB — ECHOCARDIOGRAM COMPLETE
CHL CUP TV REG PEAK VELOCITY: 317 cm/s
EERAT: 6.53
EWDT: 225 ms
FS: 32 % (ref 28–44)
HEIGHTINCHES: 70 in
IVS/LV PW RATIO, ED: 0.98
LA ID, A-P, ES: 41 mm
LA diam end sys: 41 mm
LA vol A4C: 47.7 ml
LA vol: 62.3 mL
LDCA: 3.14 cm2
LV E/e' medial: 6.53
LV E/e'average: 6.53
LV PW d: 11.3 mm — AB (ref 0.6–1.1)
LV TDI E'MEDIAL: 7.82
LV e' LATERAL: 11.6 cm/s
LVOT diameter: 20 mm
MV Dec: 225
MV pk A vel: 58.7 m/s
MV pk E vel: 75.7 m/s
MVPG: 2 mmHg
RV LATERAL S' VELOCITY: 9.55 cm/s
RV TAPSE: 12.2 mm
TDI e' lateral: 11.6
TRMAXVEL: 317 cm/s
WEIGHTICAEL: 2678.4 [oz_av]

## 2016-04-30 LAB — BASIC METABOLIC PANEL
Anion gap: 4 — ABNORMAL LOW (ref 5–15)
BUN: 26 mg/dL — AB (ref 6–20)
CALCIUM: 7.8 mg/dL — AB (ref 8.9–10.3)
CO2: 24 mmol/L (ref 22–32)
CREATININE: 1.27 mg/dL — AB (ref 0.61–1.24)
Chloride: 116 mmol/L — ABNORMAL HIGH (ref 101–111)
GFR calc non Af Amer: 51 mL/min — ABNORMAL LOW (ref >60)
GFR, EST AFRICAN AMERICAN: 59 mL/min — AB (ref >60)
GLUCOSE: 99 mg/dL (ref 65–99)
Potassium: 3.5 mmol/L (ref 3.5–5.1)
Sodium: 144 mmol/L (ref 135–145)

## 2016-04-30 LAB — CBC
HCT: 32 % — ABNORMAL LOW (ref 39.0–52.0)
Hemoglobin: 9.9 g/dL — ABNORMAL LOW (ref 13.0–17.0)
MCH: 30.7 pg (ref 26.0–34.0)
MCHC: 30.9 g/dL (ref 30.0–36.0)
MCV: 99.1 fL (ref 78.0–100.0)
PLATELETS: 147 10*3/uL — AB (ref 150–400)
RBC: 3.23 MIL/uL — ABNORMAL LOW (ref 4.22–5.81)
RDW: 15.4 % (ref 11.5–15.5)
WBC: 7.5 10*3/uL (ref 4.0–10.5)

## 2016-04-30 NOTE — Progress Notes (Signed)
  Speech Language Pathology Treatment: Dysphagia  Patient Details Name: [name] MRN: [id number] DOB: 1932/01/08 Today's Date: 04/30/2016 Time: 8887-5797 SLP Time Calculation (min) (ACUTE ONLY): 23 min  Assessment / Plan / Recommendation Clinical Impression  Skilled observation of consumption of thin via varying volumes and puree consistency with oral holding noted and suspected delay in the initiation of the swallow, paired with one incidence of delayed throat clearing; pt exhibited a congested, wet vocal quality upon SLP arrival; spoke with caregiver re: consumption of breakfast tray and she stated he consumed "85% of his tray without any coughing noted." Pt with delayed throat clearing with larger amounts of puree/thin via straw without verbal cueing to swallow; educated caregiver/nursing re: aspiration precautions and observing swallow prior to administering subsequent swallows, as well as, utilizing small bites/sips and no straws during meal and medication intake. Documented diet/precautions in room with yellow precautions sign; pt may benefit from MBS, but education needed for all who are in charge of pt care for aspiration precautions/swallowing strategies.   HPI HPI: [name] a 83 y.o.male with PMH of dementia,abdominal aortic aneurysm, CAD, CKD, CABG x5 with EF of 60%,throat cancer (1978 per dtr), and HLD presents with increased shortness of breath  and cough. Pt is a resident at [facility] and was running a fever of unknown temperature throughout the day. CXR showed left basilar airspace opacity raises concern for pneumonia.       SLP Plan  Continue with current plan of care;Other (Comment) (possible MBS)       Recommendations  Diet recommendations: Dysphagia 1 (puree);Thin liquid Liquids provided via: Teaspoon;Cup;No straw Medication Administration: Crushed with puree Supervision: Full supervision/cueing for compensatory strategies Compensations: Slow rate;Small  sips/bites Postural Changes and/or Swallow Maneuvers: Seated upright 90 degrees                Oral Care Recommendations: Oral care BID Follow up Recommendations: Skilled Nursing facility SLP Visit Diagnosis: Dysphagia, unspecified (R13.10) Plan: Continue with current plan of care;Other (Comment) (possible MBS)                       [name], M.S., CCC-SLP 04/30/2016, 10:40 AM

## 2016-04-30 NOTE — Evaluation (Signed)
Physical Therapy Evaluation Patient Details Name: [name] MRN: [id number] DOB: [birth year] Today's Date: 04/30/2016   History of Present Illness  Pt is an 83 y.o. male with medical history significant for remote history of throat cancer in the 1970s, CAD, peripheral vascular disease, dyslipidemia, hypertension with left ventricular diastolic dysfunction, hypothyroidism, GERD, dementia with behavioral disturbance, and stage III chronic kidney disease. He was admitted for HCAP.  Clinical Impression  Pt admitted with above diagnosis. Pt currently with functional limitations due to the deficits listed below (see PT Problem List). On eval, pt required +2 max assist with bed mobility. He was unable to tolerate transfers due to fatigue. See below for further eval details. Pt will benefit from skilled PT to increase their independence and safety with mobility to allow discharge to the venue listed below.       Follow Up Recommendations SNF;Supervision/Assistance - 24 hour    Equipment Recommendations  None recommended by PT    Recommendations for Other Services       Precautions / Restrictions Precautions Precautions: Fall      Mobility  Bed Mobility Overal bed mobility: Needs Assistance Bed Mobility: Supine to Sit;Sit to Supine     Supine to sit: Max assist;HOB elevated Sit to supine: +2 for physical assistance;Max assist   General bed mobility comments: Bed pad used to scoot to EOB.  Transfers                 General transfer comment: unable to perform sit to stand. Attempted with total assist.  Ambulation/Gait             General Gait Details: nonambulatory  Stairs            Wheelchair Mobility    Modified Rankin (Stroke Patients Only)       Balance Overall balance assessment: Needs assistance Sitting-balance support: Bilateral upper extremity supported;Feet supported Sitting balance-Leahy Scale: Fair Sitting balance - Comments: min to min  guard assist to maintain sitting balance EOB. Pt tolerated sitting ~ 10 minutes.                                     Pertinent Vitals/Pain Pain Assessment: Faces Faces Pain Scale: No hurt Pain Descriptors / Indicators: Grimacing Pain Intervention(s): Monitored during session    Home Living Family/patient expects to be discharged to:: Skilled nursing facility                      Prior Function Level of Independence: Needs assistance   Gait / Transfers Assistance Needed: nonambulatory, unsure if transfering with manual assist or mechanical lift  ADL's / Homemaking Assistance Needed: SNF resident. Assist with all ADLs.        Hand Dominance   Dominant Hand: Right    Extremity/Trunk Assessment                Communication   Communication: HOH  Cognition Arousal/Alertness: Awake/alert Behavior During Therapy: WFL for tasks assessed/performed Overall Cognitive Status: No family/caregiver present to determine baseline cognitive functioning                                 General Comments: Dementia at baseline. Pt following one-step commands inconsistently. Oriented to self only.      General Comments      Exercises  Assessment/Plan    PT Assessment Patient needs continued PT services  PT Problem List Decreased strength;Decreased activity tolerance;Decreased balance;Decreased mobility;Decreased cognition;Cardiopulmonary status limiting activity;Decreased knowledge of precautions;Decreased safety awareness       PT Treatment Interventions Therapeutic activities;Functional mobility training;Balance training;Therapeutic exercise;Patient/family education    PT Goals (Current goals can be found in the Care Plan section)  Acute Rehab PT Goals Patient Stated Goal: not stated PT Goal Formulation: Patient unable to participate in goal setting Time For Goal Achievement: 05/14/16 Potential to Achieve Goals: Fair    Frequency  Min 2X/week   Barriers to discharge        Co-evaluation               End of Session Equipment Utilized During Treatment: Gait belt;Oxygen Activity Tolerance: Patient limited by fatigue Patient left: in bed;with nursing/sitter in room;with call bell/phone within reach Nurse Communication: Mobility status PT Visit Diagnosis: Muscle weakness (generalized) (M62.81)    Time: 7902-4097 PT Time Calculation (min) (ACUTE ONLY): 18 min   Charges:   PT Evaluation $PT Eval Moderate Complexity: 1 Procedure     PT G Codes:        Lorrin Goodell, PT  Office # [phone] Pager [phone]   Lorriane Shire 04/30/2016, 12:44 PM

## 2016-04-30 NOTE — Progress Notes (Signed)
*  PRELIMINARY RESULTS* Echocardiogram 2D Echocardiogram has been performed.  Leavy Cella 04/30/2016, 9:18 AM

## 2016-04-30 NOTE — Progress Notes (Signed)
PROGRESS NOTE    [name]  BJS:[id number] DOB: 06-May-1931 DOA: 04/28/2016 PCP: Garret Reddish, MD     Brief Narrative:  [name] is a 83 y.o. male with medical history significant for remote history of throat cancer in the 1970s, CAD and peripheral vascular disease, dyslipidemia, hypertension with left ventricular diastolic dysfunction, hypothyroidism, GERD, dementia with behavioral disturbance all medical therapy, stage III chronic kidney disease who was sent from the nursing home after developing audible abnormal respiratory sounds and effort associated with productive cough with thick yellow sputum and reported shortness of breath. Upon arrival to the ER patient was found be mildly hypoxemic with room air saturations 90%. Chest x-ray concerning for early left basilar pneumonia.   Assessment & Plan:   Principal Problem:   HCAP (healthcare-associated pneumonia) Active Problems:   Hyperlipidemia   Essential hypertension   CAD (coronary artery disease) s/p bypass graft   GERD   BPH (benign prostatic hyperplasia)   CKD (chronic kidney disease), stage III   Vascular dementia with behavior disturbance   Acute kidney injury ([place])   Acute respiratory failure with hypoxia (HCC)   Left ventricular diastolic dysfunction   Hypothyroidism   Abnormal urinalysis  Acute respiratory failure with hypoxia  -Continue nasal cannula oxygen as needed, wean as able   Sepsis secondary to HCAP, aspiration  -Repeat chest x-ray obtained today, with worsening multilobar bronchopneumonia, most severe in the left lower lobe -Blood cultures negative to date  -Continue supportive care, broad-spectrum antibiotic vanco/zosyn, deescalate today to zosyn as patient afebrile 24 hours, WBC normal  -SLP: moderate dysphagia risk   AKI on CKD stage 3  -Baseline Cr 0.98 -Improving on IVF   Vascular dementia with behavior disturbance/FTT -Family mentions patient has poor impulse control and they are  planning on providing 24/7 sitters -Continue preadmission Depakote, Lexapro, Ativan and Zyprexa, trazodone  Left ventricular diastolic dysfunction -Noted on echocardiogram 2015 and has apparently never been symptomatic therefore not on ACE inhibitor/ARB or diuretics -Echo pending   Hypothyroidism -Continue Synthroid  CAD (coronary artery disease) s/p bypass graft -Continue baby aspirin  BPH (benign prostatic hyperplasia) -Continue pro-scar   DVT prophylaxis: lovenox  Code Status: DNR Family Communication: Discussed with daughter over the phone today. They remain realistic of expectations for patient's improvement. Continue non-aggressive medical therapies to see improvement. Palliative care consulted at time of admission  Disposition Plan: Pending improvement, SNF  Consultants:   Palliative care  Procedures:   None  Antimicrobials:  Anti-infectives    Start     Dose/Rate Route Frequency Ordered Stop   04/30/16 0600  vancomycin (VANCOCIN) IVPB 1000 mg/200 mL premix  Status:  Discontinued     1,000 mg 200 mL/hr over 60 Minutes Intravenous Every 48 hours 04/28/16 0722 04/29/16 1014   04/29/16 2100  piperacillin-tazobactam (ZOSYN) IVPB 3.375 g     3.375 g 12.5 mL/hr over 240 Minutes Intravenous Every 8 hours 04/29/16 1205     04/29/16 1300  piperacillin-tazobactam (ZOSYN) IVPB 3.375 g     3.375 g 100 mL/hr over 30 Minutes Intravenous  Once 04/29/16 1205 04/29/16 1315   04/29/16 1100  vancomycin (VANCOCIN) IVPB 1000 mg/200 mL premix  Status:  Discontinued     1,000 mg 200 mL/hr over 60 Minutes Intravenous Every 24 hours 04/29/16 1014 04/30/16 1006   04/28/16 0800  vancomycin (VANCOCIN) 1,250 mg in sodium chloride 0.9 % 250 mL IVPB     1,250 mg 166.7 mL/hr over 90 Minutes Intravenous  Once 04/28/16  6644 04/28/16 1205   04/28/16 0730  ceFEPIme (MAXIPIME) 2 g in dextrose 5 % 50 mL IVPB  Status:  Discontinued     2 g 100 mL/hr over 30 Minutes Intravenous Every 24 hours  04/28/16 0716 04/29/16 1132   04/28/16 0445  cefTRIAXone (ROCEPHIN) 1 g in dextrose 5 % 50 mL IVPB     1 g 100 mL/hr over 30 Minutes Intravenous  Once 04/28/16 0433 04/28/16 0538   04/28/16 0445  azithromycin (ZITHROMAX) 500 mg in dextrose 5 % 250 mL IVPB     500 mg 250 mL/hr over 60 Minutes Intravenous  Once 04/28/16 0433 04/28/16 0804         Subjective: Patient is very somnolent on examination. Does awaken eyes to voice. No acute events overnight.  Objective: Vitals:   04/29/16 1345 04/29/16 1349 04/29/16 2100 04/30/16 0500  BP: (!) 81/40  (!) 98/56 116/64  Pulse: 65  (!) 55 (!) 54  Resp: 20  20 16   Temp: 98.1 F (36.7 C)  98.4 F (36.9 C) 97.4 F (36.3 C)  TempSrc: Oral  Axillary Axillary  SpO2: 90% 97% 97% 97%  Weight:    75.9 kg (167 lb 6.4 oz)  Height:        Intake/Output Summary (Last 24 hours) at 04/30/16 1007 Last data filed at 04/30/16 0500  Gross per 24 hour  Intake          2144.17 ml  Output              850 ml  Net          1294.17 ml   Filed Weights   04/28/16 0646 04/29/16 0455 04/30/16 0500  Weight: 69.6 kg (153 lb 8 oz) 76.1 kg (167 lb 11.2 oz) 75.9 kg (167 lb 6.4 oz)    Examination:  General exam: Appears calm and comfortable, sleeping, opens eyes to voice but does not answer questions  Respiratory system: Coarse. Respiratory effort normal. Cardiovascular system: S1 & S2 heard, RRR. No JVD, murmurs, rubs, gallops or clicks. No pedal edema. Gastrointestinal system: Abdomen is nondistended, soft and nontender.  Central nervous system: Alert to voice Extremities: Symmetric in appearance  Skin: No rashes, lesions or ulcers on exposed skin   Data Reviewed: I have personally reviewed following labs and imaging studies  CBC:  Recent Labs Lab 04/28/16 0415 04/29/16 0316 04/30/16 0239  WBC 8.5 11.5* 7.5  NEUTROABS 7.1  --   --   HGB 13.3 12.3* 9.9*  HCT 40.8 39.1 32.0*  MCV 96.9 100.0 99.1  PLT 161 170 034*   Basic Metabolic  Panel:  Recent Labs Lab 04/28/16 0400 04/29/16 0316 04/30/16 0239  NA 143 147* 144  K 4.4 5.1 3.5  CL 110 113* 116*  CO2 24 20* 24  GLUCOSE 90 90 99  BUN 27* 23* 26*  CREATININE 1.28* 1.49* 1.27*  CALCIUM 8.6* 8.5* 7.8*   GFR: Estimated Creatinine Clearance: 46.3 mL/min (A) (by C-G formula based on SCr of 1.27 mg/dL (H)). Liver Function Tests:  Recent Labs Lab 04/28/16 0400  AST 19  ALT 13*  ALKPHOS 61  BILITOT 0.7  PROT 5.8*  ALBUMIN 2.9*   No results for input(s): LIPASE, AMYLASE in the last 168 hours. No results for input(s): AMMONIA in the last 168 hours. Coagulation Profile: No results for input(s): INR, PROTIME in the last 168 hours. Cardiac Enzymes:  Recent Labs Lab 04/28/16 0400  TROPONINI <0.03   BNP (last 3 results) No  results for input(s): PROBNP in the last 8760 hours. HbA1C: No results for input(s): HGBA1C in the last 72 hours. CBG: No results for input(s): GLUCAP in the last 168 hours. Lipid Profile: No results for input(s): CHOL, HDL, LDLCALC, TRIG, CHOLHDL, LDLDIRECT in the last 72 hours. Thyroid Function Tests:  Recent Labs  04/28/16 0942  TSH 3.719   Anemia Panel: No results for input(s): VITAMINB12, FOLATE, FERRITIN, TIBC, IRON, RETICCTPCT in the last 72 hours. Sepsis Labs:  Recent Labs Lab 04/28/16 0411 04/28/16 0746  PROCALCITON  --  <0.10  LATICACIDVEN 0.99  --     Recent Results (from the past 240 hour(s))  Blood culture (routine x 2)     Status: None (Preliminary result)   Collection Time: 04/28/16  4:00 AM  Result Value Ref Range Status   Specimen Description BLOOD RIGHT HAND  Final   Special Requests IN PEDIATRIC BOTTLE Blood Culture adequate volume  Final   Culture NO GROWTH 1 DAY  Final   Report Status PENDING  Incomplete  Blood culture (routine x 2)     Status: None (Preliminary result)   Collection Time: 04/28/16  4:15 AM  Result Value Ref Range Status   Specimen Description BLOOD RIGHT FOREARM  Final    Special Requests   Final    BOTTLES DRAWN AEROBIC AND ANAEROBIC Blood Culture adequate volume   Culture NO GROWTH 1 DAY  Final   Report Status PENDING  Incomplete  Urine culture     Status: Abnormal   Collection Time: 04/28/16  5:09 AM  Result Value Ref Range Status   Specimen Description URINE, CATHETERIZED  Final   Special Requests NONE  Final   Culture <10,000 COLONIES/mL INSIGNIFICANT GROWTH (A)  Final   Report Status 04/29/2016 FINAL  Final  MRSA PCR Screening     Status: None   Collection Time: 04/28/16 11:28 AM  Result Value Ref Range Status   MRSA by PCR NEGATIVE NEGATIVE Final    Comment:        The GeneXpert MRSA Assay (FDA approved for NASAL specimens only), is one component of a comprehensive MRSA colonization surveillance program. It is not intended to diagnose MRSA infection nor to guide or monitor treatment for MRSA infections.        Radiology Studies: Dg Chest 2 View  Result Date: 04/29/2016 CLINICAL DATA:  83 year old male with history of respiratory failure with hypoxia. EXAM: CHEST  2 VIEW COMPARISON:  Chest x-ray 04/28/2016. FINDINGS: Lung volumes are low. Patchy multifocal interstitial and airspace disease throughout the mid to lower lungs bilaterally, asymmetrically distributed, most severe on the left side. Trace left pleural effusion. Mild crowding of the pulmonary vasculature, without frank pulmonary edema. Heart size is upper limits of normal. The patient is rotated to the right on today's exam, resulting in distortion of the mediastinal contours and reduced diagnostic sensitivity and specificity for mediastinal pathology. Aortic atherosclerosis. IMPRESSION: 1. Overall, the appearance the chest suggests worsening multilobar bronchopneumonia, most severe in the left lower lobe, now with a trace left pleural effusion. 2. Aortic atherosclerosis. Electronically Signed   By: Vinnie Langton M.D.   On: 04/29/2016 08:24      Scheduled Meds: . aspirin  81 mg  Oral Daily  . brimonidine  1 drop Both Eyes QHS  . divalproex  500 mg Oral BID  . enoxaparin (LOVENOX) injection  40 mg Subcutaneous Q24H  . escitalopram  20 mg Oral Daily  . feeding supplement (ENSURE ENLIVE)  237 mL  Oral BID BM  . finasteride  5 mg Oral Daily  . levothyroxine  50 mcg Oral QAC breakfast  . OLANZapine  5 mg Oral QHS  . piperacillin-tazobactam (ZOSYN)  IV  3.375 g Intravenous Q8H  . polyethylene glycol  17 g Oral BID  . sodium chloride  1,000 mL Intravenous Once  . traZODone  50 mg Oral QHS  . Vitamin D (Ergocalciferol)  50,000 Units Oral Once per day on Tue Sat   Continuous Infusions: . sodium chloride 125 mL/hr at 04/30/16 0059     LOS: 2 days    Time spent: 30 minutes   Dessa Phi, DO [facility] [email or web address] Password [email or web address] 04/30/2016, 10:07 AM

## 2016-05-01 LAB — CBC WITH DIFFERENTIAL/PLATELET
BASOS PCT: 0 %
Basophils Absolute: 0 10*3/uL (ref 0.0–0.1)
EOS ABS: 0.1 10*3/uL (ref 0.0–0.7)
Eosinophils Relative: 1 %
HEMATOCRIT: 32.9 % — AB (ref 39.0–52.0)
HEMOGLOBIN: 10.5 g/dL — AB (ref 13.0–17.0)
Lymphocytes Relative: 17 %
Lymphs Abs: 1.2 10*3/uL (ref 0.7–4.0)
MCH: 31.3 pg (ref 26.0–34.0)
MCHC: 31.9 g/dL (ref 30.0–36.0)
MCV: 97.9 fL (ref 78.0–100.0)
MONO ABS: 0.6 10*3/uL (ref 0.1–1.0)
MONOS PCT: 9 %
NEUTROS PCT: 73 %
Neutro Abs: 5.2 10*3/uL (ref 1.7–7.7)
Platelets: 149 10*3/uL — ABNORMAL LOW (ref 150–400)
RBC: 3.36 MIL/uL — AB (ref 4.22–5.81)
RDW: 15.3 % (ref 11.5–15.5)
WBC: 7.2 10*3/uL (ref 4.0–10.5)

## 2016-05-01 LAB — BASIC METABOLIC PANEL
ANION GAP: 9 (ref 5–15)
BUN: 18 mg/dL (ref 6–20)
CHLORIDE: 111 mmol/L (ref 101–111)
CO2: 26 mmol/L (ref 22–32)
Calcium: 7.8 mg/dL — ABNORMAL LOW (ref 8.9–10.3)
Creatinine, Ser: 1.08 mg/dL (ref 0.61–1.24)
GFR calc Af Amer: >60 mL/min (ref >60)
GLUCOSE: 89 mg/dL (ref 65–99)
POTASSIUM: 3.4 mmol/L — AB (ref 3.5–5.1)
Sodium: 146 mmol/L — ABNORMAL HIGH (ref 135–145)

## 2016-05-01 MED ORDER — POTASSIUM CHLORIDE CRYS ER 20 MEQ PO TBCR
40.0000 meq | EXTENDED_RELEASE_TABLET | Freq: Once | ORAL | Status: AC
  Administered 2016-05-01: 40 meq via ORAL
  Filled 2016-05-01: qty 2

## 2016-05-01 MED ORDER — POTASSIUM CHLORIDE 20 MEQ/15ML (10%) PO SOLN
40.0000 meq | Freq: Once | ORAL | Status: DC
  Filled 2016-05-01: qty 30

## 2016-05-01 MED ORDER — ENSURE ENLIVE PO LIQD: 237.0000 mL | Freq: Two times a day (BID) | ORAL | 12 refills | Status: AC

## 2016-05-01 MED ORDER — AMOXICILLIN-POT CLAVULANATE 250-62.5 MG/5ML PO SUSR
500.0000 mg | Freq: Two times a day (BID) | ORAL | Status: DC
Start: 2016-05-01 — End: 2016-05-02
  Administered 2016-05-01 – 2016-05-02 (×3): 500 mg via ORAL
  Filled 2016-05-01 (×4): qty 10

## 2016-05-01 NOTE — Progress Notes (Signed)
PROGRESS NOTE    [name]  BWG:[id number] DOB: May 12, 1931 DOA: 04/28/2016 PCP: Garret Reddish, MD     Brief Narrative:  [name] is a 83 y.o. male with medical history significant for remote history of throat cancer in the 1970s, CAD and peripheral vascular disease, dyslipidemia, hypertension with left ventricular diastolic dysfunction, hypothyroidism, GERD, dementia with behavioral disturbance all medical therapy, stage III chronic kidney disease who was sent from the nursing home after developing audible abnormal respiratory sounds and effort associated with productive cough with thick yellow sputum and reported shortness of breath. Upon arrival to the ER patient was found be mildly hypoxemic with room air saturations 90%. Chest x-ray concerning for early left basilar pneumonia.   Assessment & Plan:   Principal Problem:   HCAP (healthcare-associated pneumonia) Active Problems:   Hyperlipidemia   Essential hypertension   CAD (coronary artery disease) s/p bypass graft   GERD   BPH (benign prostatic hyperplasia)   CKD (chronic kidney disease), stage III   Vascular dementia with behavior disturbance   Acute kidney injury ([place])   Acute respiratory failure with hypoxia (HCC)   Left ventricular diastolic dysfunction   Hypothyroidism   Abnormal urinalysis  Acute respiratory failure with hypoxia  -Continue nasal cannula oxygen as needed, wean as able   Sepsis secondary to HCAP, aspiration  -Repeat chest x-ray obtained today, with worsening multilobar bronchopneumonia, most severe in the left lower lobe -Blood cultures negative to date  -Continue supportive care, improved on broad-spectrum antibiotics, will deescalate to oral augmentin today  -SLP: moderate dysphagia risk   AKI on CKD stage 3  -Baseline Cr 0.98 -Resolved on IVF   Vascular dementia with behavior disturbance/FTT -Family mentions patient has poor impulse control and they are planning on providing 24/7  sitters -Continue preadmission Depakote, Lexapro, Ativan and Zyprexa, trazodone  Left ventricular diastolic dysfunction -Noted on echocardiogram 2015 and has apparently never been symptomatic therefore not on ACE inhibitor/ARB or diuretics -Echo pending   Hypothyroidism -Continue Synthroid  CAD (coronary artery disease) s/p bypass graft -Continue baby aspirin  BPH (benign prostatic hyperplasia) -Continue pro-scar   DVT prophylaxis: lovenox  Code Status: DNR Family Communication: Discussed with daughter over the phone today Disposition Plan: Back to ALF memory unit with sitter and HHPT as long as patient remains stable on oral antibiotics   Consultants:   Palliative care  Procedures:   None  Antimicrobials:  Anti-infectives    Start     Dose/Rate Route Frequency Ordered Stop   04/30/16 0600  vancomycin (VANCOCIN) IVPB 1000 mg/200 mL premix  Status:  Discontinued     1,000 mg 200 mL/hr over 60 Minutes Intravenous Every 48 hours 04/28/16 0722 04/29/16 1014   04/29/16 2100  piperacillin-tazobactam (ZOSYN) IVPB 3.375 g     3.375 g 12.5 mL/hr over 240 Minutes Intravenous Every 8 hours 04/29/16 1205     04/29/16 1300  piperacillin-tazobactam (ZOSYN) IVPB 3.375 g     3.375 g 100 mL/hr over 30 Minutes Intravenous  Once 04/29/16 1205 04/29/16 1315   04/29/16 1100  vancomycin (VANCOCIN) IVPB 1000 mg/200 mL premix  Status:  Discontinued     1,000 mg 200 mL/hr over 60 Minutes Intravenous Every 24 hours 04/29/16 1014 04/30/16 1006   04/28/16 0800  vancomycin (VANCOCIN) 1,250 mg in sodium chloride 0.9 % 250 mL IVPB     1,250 mg 166.7 mL/hr over 90 Minutes Intravenous  Once 04/28/16 0722 04/28/16 1205   04/28/16 0730  ceFEPIme (MAXIPIME) 2  g in dextrose 5 % 50 mL IVPB  Status:  Discontinued     2 g 100 mL/hr over 30 Minutes Intravenous Every 24 hours 04/28/16 0716 04/29/16 1132   04/28/16 0445  cefTRIAXone (ROCEPHIN) 1 g in dextrose 5 % 50 mL IVPB     1 g 100 mL/hr over 30  Minutes Intravenous  Once 04/28/16 0433 04/28/16 0538   04/28/16 0445  azithromycin (ZITHROMAX) 500 mg in dextrose 5 % 250 mL IVPB     500 mg 250 mL/hr over 60 Minutes Intravenous  Once 04/28/16 0433 04/28/16 0804         Subjective: Patient is alert and awake this morning and on examination. He remains pleasantly confused, no acute events   Objective: Vitals:   04/30/16 0500 04/30/16 1351 04/30/16 2031 05/01/16 0322  BP: 116/64 139/71 (!) 146/98 137/68  Pulse: (!) 54 61 66 89  Resp: 16 18 20 18   Temp: 97.4 F (36.3 C) 98.1 F (36.7 C) 99.6 F (37.6 C) 97.2 F (36.2 C)  TempSrc: Axillary Axillary Axillary Axillary  SpO2: 97% 95%  97%  Weight: 75.9 kg (167 lb 6.4 oz)   75.7 kg (166 lb 12.8 oz)  Height:        Intake/Output Summary (Last 24 hours) at 05/01/16 1120 Last data filed at 05/01/16 0800  Gross per 24 hour  Intake              510 ml  Output              850 ml  Net             -340 ml   Filed Weights   04/29/16 0455 04/30/16 0500 05/01/16 0322  Weight: 76.1 kg (167 lb 11.2 oz) 75.9 kg (167 lb 6.4 oz) 75.7 kg (166 lb 12.8 oz)    Examination:  General exam: Appears calm and comfortable, confused which is baseline  Respiratory system: Respiratory effort normal. Cardiovascular system: S1 & S2 heard, RRR. No JVD, murmurs, rubs, gallops or clicks. No pedal edema. Gastrointestinal system: Abdomen is nondistended, soft and nontender.  Central nervous system: Nonfocal, confused due to dementia  Extremities: Symmetric in appearance  Skin: No rashes, lesions or ulcers on exposed skin   Data Reviewed: I have personally reviewed following labs and imaging studies  CBC:  Recent Labs Lab 04/28/16 0415 04/29/16 0316 04/30/16 0239 05/01/16 0310  WBC 8.5 11.5* 7.5 7.2  NEUTROABS 7.1  --   --  5.2  HGB 13.3 12.3* 9.9* 10.5*  HCT 40.8 39.1 32.0* 32.9*  MCV 96.9 100.0 99.1 97.9  PLT 161 170 147* 063*   Basic Metabolic Panel:  Recent Labs Lab 04/28/16 0400  04/29/16 0316 04/30/16 0239 05/01/16 0310  NA 143 147* 144 146*  K 4.4 5.1 3.5 3.4*  CL 110 113* 116* 111  CO2 24 20* 24 26  GLUCOSE 90 90 99 89  BUN 27* 23* 26* 18  CREATININE 1.28* 1.49* 1.27* 1.08  CALCIUM 8.6* 8.5* 7.8* 7.8*   GFR: Estimated Creatinine Clearance: 54.4 mL/min (by C-G formula based on SCr of 1.08 mg/dL). Liver Function Tests:  Recent Labs Lab 04/28/16 0400  AST 19  ALT 13*  ALKPHOS 61  BILITOT 0.7  PROT 5.8*  ALBUMIN 2.9*   No results for input(s): LIPASE, AMYLASE in the last 168 hours. No results for input(s): AMMONIA in the last 168 hours. Coagulation Profile: No results for input(s): INR, PROTIME in the last 168 hours. Cardiac  Enzymes:  Recent Labs Lab 04/28/16 0400  TROPONINI <0.03   BNP (last 3 results) No results for input(s): PROBNP in the last 8760 hours. HbA1C: No results for input(s): HGBA1C in the last 72 hours. CBG: No results for input(s): GLUCAP in the last 168 hours. Lipid Profile: No results for input(s): CHOL, HDL, LDLCALC, TRIG, CHOLHDL, LDLDIRECT in the last 72 hours. Thyroid Function Tests: No results for input(s): TSH, T4TOTAL, FREET4, T3FREE, THYROIDAB in the last 72 hours. Anemia Panel: No results for input(s): VITAMINB12, FOLATE, FERRITIN, TIBC, IRON, RETICCTPCT in the last 72 hours. Sepsis Labs:  Recent Labs Lab 04/28/16 0411 04/28/16 0746  PROCALCITON  --  <0.10  LATICACIDVEN 0.99  --     Recent Results (from the past 240 hour(s))  Blood culture (routine x 2)     Status: None (Preliminary result)   Collection Time: 04/28/16  4:00 AM  Result Value Ref Range Status   Specimen Description BLOOD RIGHT HAND  Final   Special Requests IN PEDIATRIC BOTTLE Blood Culture adequate volume  Final   Culture NO GROWTH 2 DAYS  Final   Report Status PENDING  Incomplete  Blood culture (routine x 2)     Status: None (Preliminary result)   Collection Time: 04/28/16  4:15 AM  Result Value Ref Range Status   Specimen  Description BLOOD RIGHT FOREARM  Final   Special Requests   Final    BOTTLES DRAWN AEROBIC AND ANAEROBIC Blood Culture adequate volume   Culture NO GROWTH 2 DAYS  Final   Report Status PENDING  Incomplete  Urine culture     Status: Abnormal   Collection Time: 04/28/16  5:09 AM  Result Value Ref Range Status   Specimen Description URINE, CATHETERIZED  Final   Special Requests NONE  Final   Culture <10,000 COLONIES/mL INSIGNIFICANT GROWTH (A)  Final   Report Status 04/29/2016 FINAL  Final  MRSA PCR Screening     Status: None   Collection Time: 04/28/16 11:28 AM  Result Value Ref Range Status   MRSA by PCR NEGATIVE NEGATIVE Final    Comment:        The GeneXpert MRSA Assay (FDA approved for NASAL specimens only), is one component of a comprehensive MRSA colonization surveillance program. It is not intended to diagnose MRSA infection nor to guide or monitor treatment for MRSA infections.        Radiology Studies: No results found.    Scheduled Meds: . aspirin  81 mg Oral Daily  . brimonidine  1 drop Both Eyes QHS  . divalproex  500 mg Oral BID  . enoxaparin (LOVENOX) injection  40 mg Subcutaneous Q24H  . escitalopram  20 mg Oral Daily  . feeding supplement (ENSURE ENLIVE)  237 mL Oral BID BM  . finasteride  5 mg Oral Daily  . levothyroxine  50 mcg Oral QAC breakfast  . OLANZapine  5 mg Oral QHS  . piperacillin-tazobactam (ZOSYN)  IV  3.375 g Intravenous Q8H  . polyethylene glycol  17 g Oral BID  . potassium chloride  40 mEq Oral Once  . traZODone  50 mg Oral QHS  . Vitamin D (Ergocalciferol)  50,000 Units Oral Once per day on Tue Sat   Continuous Infusions:    LOS: 3 days    Time spent: 30 minutes   Dessa Phi, DO [facility] [email or web address] Password [email or web address] 05/01/2016, 11:20 AM

## 2016-05-01 NOTE — Clinical Social Work Placement (Signed)
   CLINICAL SOCIAL WORK PLACEMENT  NOTE  Date:  05/01/2016  Patient Details  Name: [name] MRN: [id number] Date of Birth: 1931-03-28  Clinical Social Work is seeking post-discharge placement for this patient at the [place] level of care (*CSW will initial, date and re-position this form in  chart as items are completed):  No   Patient/family provided with [place] Work Department's list of facilities offering this level of care within the geographic area requested by the patient (or if unable, by the patient's family).  No   Patient/family informed of their freedom to choose among providers that offer the needed level of care, that participate in Medicare, Medicaid or managed care program needed by the patient, have an available bed and are willing to accept the patient.  No   Patient/family informed of [facility]'s ownership interest in [facility] and [facility], as well as of the fact that they are under no obligation to receive care at these facilities.  PASRR submitted to EDS on       PASRR number received on       Existing PASRR number confirmed on 05/01/16     FL2 transmitted to all facilities in geographic area requested by pt/family on       FL2 transmitted to all facilities within larger geographic area on       Patient informed that his/her managed care company has contracts with or will negotiate with certain facilities, including the following:        No   Patient/family informed of bed offers received.  Patient chooses bed at [facility]     Physician recommends and patient chooses bed at      Patient to be transferred to [facility] on  .  Patient to be transferred to facility by [facility]     Patient family notified on   of transfer.  Name of family member notified:  Earle Gell     PHYSICIAN Please sign FL2     Additional Comment:     _______________________________________________ Wende Neighbors, LCSW 05/01/2016, 12:56 PM

## 2016-05-01 NOTE — Clinical Social Work Note (Signed)
Clinical Social Work Assessment  Patient Details  Name: [name] MRN: [id number] Date of Birth: [birth year]  Date of referral:  05/01/16               Reason for consult:  Discharge Planning                Permission sought to share information with:  Family Supports Permission granted to share information::  Yes, Verbal Permission Granted  Name::     [name]  Agency::     Relationship::  daughter/POA  Contact Information:  [phone]  Housing/Transportation Living arrangements for the past 2 months:  [place] of Information:  Adult Children Patient Interpreter Needed:  None Criminal Activity/Legal Involvement Pertinent to Current Situation/Hospitalization:  No - Comment as needed Significant Relationships:  Adult Children Lives with:  Self, Facility Resident Do you feel safe going back to the place where you live?  Yes Need for family participation in patient care:  No (Coment)  Care giving concerns:  Patient live at [facility] under their memory care unit. Family is very supportive in patients care  Social Worker assessment / plan:  CSW spoke to patients daughter [name] [facility]). [name] stated that she would like patient to discharge back to facility. [name] stated that when patient is at [facility] they have a sitter come into his home 33 week for 6 hours so patient is able to get one on one care. As well as in the past they had PT coming 2x week. [name] stated that if they need to start PT again at facility she will contact facility director and request it.   Employment status:  Retired Forensic scientist:  Medicare PT Recommendations:  24 [place] / Referral to community resources:  Other (Comment Required) (back to memory care)  Patient/Family's Response to care: Patient family verbalized appreciation and understanding for CSW role and involvement in care. Patient agreeable with current discharge plan to  SNF  Patient/Family's Understanding of and Emotional Response to Diagnosis, Current Treatment, and Prognosis:  Patients family  with good understanding of current medical state and limitations around most recent hospitalization.  Emotional Assessment Appearance:  Appears stated age Attitude/Demeanor/Rapport:  Unable to Assess Affect (typically observed):  Unable to Assess Orientation:   (not oriented ) Alcohol / Substance use:  Not Applicable Psych involvement (Current and /or in the community):  No (Comment)  Discharge Needs  Concerns to be addressed:  No discharge needs identified Readmission within the last 30 days:  No Current discharge risk:  None Barriers to Discharge:  No Barriers Identified   Wende Neighbors, LCSW 05/01/2016, 12:48 PM

## 2016-05-01 NOTE — Consult Note (Signed)
[facility] CM Primary Care Navigator  05/01/2016  [name] 07-07-1931 [id number]   Met with patient (dementia)and caregiver at the bedsideto identify possible discharge needs.  Was able to speak to patient's daughter Loletha Carrow) over the phone as well. Daughter reports that patient had fever and productive coughing that had led to this admission. Daughter mentioned that patient is residing at [facility] facility for almost a year and his care needs had been provided by private sitters and facility staff that includes administering of his medications through facility pharmacy ([facility]).  Daughter states that patient had Dr. Reynold Bowen with [place] as theprimary care provider prior to living in [facility] but is now being seen by physicians with Dr's. [facility] who visit the patient in the facility.  Plan for discharge is to return back to [facility]) when medically stable according to daughter.  No other health management needs or concerns identified at this time.   For questions, please contact:  Dannielle Huh, BSN, RN- [facility] Primary Care Navigator  Telephone: [phone] [place]

## 2016-05-01 NOTE — NC FL2 (Signed)
[place] LEVEL OF CARE SCREENING TOOL     IDENTIFICATION  Patient Name: [name] Birthdate: 1931/08/10 Sex: male Admission Date (Current Location): 04/28/2016  [facility] and [place] Number:  Herbalist and Address:  [facility] [place] [place] [address], [place],  [address]      Provider Number: [id number]  Attending Physician Name and Address:  Shon Millet*  Relative Name and Phone Number:  Earle Gell    Current Level of Care: Hospital Recommended Level of Care: [place] Prior Approval Number:    Date Approved/Denied:   PASRR Number:    Discharge Plan: Other (Comment) (ALF)    Current Diagnoses: Patient Active Problem List   Diagnosis Date Noted  . HCAP (healthcare-associated pneumonia) 04/28/2016  . Acute kidney injury ([place]) 04/28/2016  . Acute respiratory failure with hypoxia ([place]) 04/28/2016  . Left ventricular diastolic dysfunction 38/18/2993  . Hypothyroidism 04/28/2016  . Abnormal urinalysis 04/28/2016  . Vascular dementia with behavior disturbance 11/15/2015  . Palliative care patient 11/15/2015  . Acute encephalopathy 09/23/2015  . Depression 10/24/2013  . CKD (chronic kidney disease), stage III 09/28/2013  . Right inguinal hernia 09/26/2013  . AAA (abdominal aortic aneurysm) without rupture ([place]) 07/22/2013  . Cervical spondylosis without myelopathy 06/19/2013  . PVD- hx CEA,patent carotids by doppler July 2015 09/26/2010  . 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 Essential hypertension 03/16/2009  . Low back pain potentially associated with radiculopathy 03/19/2008  . BPH (benign prostatic hyperplasia) 11/25/2007  . GERD 10/24/2006  . Hyperlipidemia 09/14/2006  . CAD (coronary artery disease) s/p bypass graft 09/14/2006  . NEPHROLITHIASIS, HX OF 09/14/2006    Orientation RESPIRATION BLADDER Height & Weight      (disorientedx4)  O2 (4L) External catheter Weight: 166  lb 12.8 oz (75.7 kg) Height:  5\' 10"  (177.8 cm)  BEHAVIORAL SYMPTOMS/MOOD NEUROLOGICAL BOWEL NUTRITION STATUS      Incontinent Diet (DSY1)  AMBULATORY STATUS COMMUNICATION OF NEEDS Skin   Extensive Assist Non-Verbally Normal                       Personal Care Assistance Level of Assistance  Bathing, Feeding, Dressing Bathing Assistance: Maximum assistance Feeding assistance: Maximum assistance Dressing Assistance: Maximum assistance     Functional Limitations Info  Sight, Hearing, Speech Sight Info: Adequate Hearing Info: Adequate Speech Info: Impaired    SPECIAL CARE FACTORS FREQUENCY  PT (By licensed PT), OT (By licensed OT)     PT Frequency: 3x week OT Frequency: 3x week            Contractures Contractures Info: Not present    Additional Factors Info  Code Status, Allergies (DNR) Code Status Info: DNR Allergies Info: AVINZA MORPHINE SULFATE, DOXYCYCLINE CALCIUM, VIBRAMYCIN DOXYCYCLINE HYCLATE, FLOMAX TAMSULOSIN HCL, STATINS, CODEINE SULFATE, OXYCODONE           Current Medications (05/01/2016):  This is the current hospital active medication list Current Facility-Administered Medications  Medication Dose Route Frequency Provider Last Rate Last Dose  . acetaminophen (TYLENOL) tablet 650 mg  650 mg Oral Q6H PRN Samella Parr, NP   650 mg at 04/28/16 1033   Or  . acetaminophen (TYLENOL) suppository 650 mg  650 mg Rectal Q6H PRN Samella Parr, NP   650 mg at 04/29/16 0502  . amoxicillin-clavulanate (AUGMENTIN) 250-62.5 MG/5ML suspension 500 mg  500 mg Oral Q12H Jennifer Chahn-Yang Choi, DO   500 mg at 05/01/16 1315  .  aspirin chewable tablet 81 mg  81 mg Oral Daily Samella Parr, NP   81 mg at 04/30/16 1006  . brimonidine (ALPHAGAN) 0.15 % ophthalmic solution 1 drop  1 drop Both Eyes QHS Samella Parr, NP   1 drop at 04/30/16 2139  . divalproex (DEPAKOTE SPRINKLE) capsule 500 mg  500 mg Oral BID Samella Parr, NP   500 mg at 04/30/16 2134  .  enoxaparin (LOVENOX) injection 40 mg  40 mg Subcutaneous Q24H Samella Parr, NP   40 mg at 05/01/16 1028  . escitalopram (LEXAPRO) tablet 20 mg  20 mg Oral Daily Samella Parr, NP   20 mg at 04/30/16 1007  . feeding supplement (ENSURE ENLIVE) (ENSURE ENLIVE) liquid 237 mL  237 mL Oral BID BM Elwin Mocha, MD   237 mL at 05/01/16 1000  . finasteride (PROSCAR) tablet 5 mg  5 mg Oral Daily Samella Parr, NP   5 mg at 04/30/16 1006  . levothyroxine (SYNTHROID, LEVOTHROID) tablet 50 mcg  50 mcg Oral QAC breakfast Samella Parr, NP   50 mcg at 05/01/16 0654  . LORazepam (ATIVAN) tablet 0.5-1 mg  0.5-1 mg Oral BID PRN Samella Parr, NP   0.5 mg at 05/01/16 1308  . OLANZapine (ZYPREXA) tablet 5 mg  5 mg Oral QHS Samella Parr, NP   5 mg at 04/30/16 2134  . polyethylene glycol (MIRALAX / GLYCOLAX) packet 17 g  17 g Oral BID Samella Parr, NP   17 g at 04/30/16 1007  . traZODone (DESYREL) tablet 50 mg  50 mg Oral QHS Samella Parr, NP   50 mg at 04/30/16 2134  . Vitamin D (Ergocalciferol) (DRISDOL) capsule 50,000 Units  50,000 Units Oral Once per day on Tue Sat Samella Parr, NP   50,000 Units at 04/29/16 1003     Discharge Medications: Please see discharge summary for a list of discharge medications.  Relevant Imaging Results:  Relevant Lab Results:   Additional Information [phone]  Wende Neighbors, LCSW

## 2016-05-01 NOTE — Progress Notes (Signed)
  Speech Language Pathology Treatment: Dysphagia  Patient Details Name: [name] MRN: [id number] DOB: 03-26-1931 Today's Date: 05/01/2016 Time: 8309-4076 SLP Time Calculation (min) (ACUTE ONLY): 22 min  Assessment / Plan / Recommendation Clinical Impression  Pt seen for dysphagia intervention; demonstrated continued confusion and dementia behaviors (fidgeting, pulling out nasal cannula, grabbing spoon). Observed wet vocal quality and chest congestion prior to and during intake of POs. Pt required assistance for feeding and moderate-maximal verbal/visual/tactile cueing for tasks and following directions. Noted mild oral holding with pureed solids and suspected delayed swallow initiation and delayed throat clearing with thin liquids. Given current pneumonia and clinical observations at bedside, pt is at risk for aspiration. Recommend continuing current diet of Dys 1 solids, thin liquids (no straws), meds crushed. Discussed options with pt's HCPOA/daughter ([name]): instrumental swallow evaluation (MBS) vs. less aggressive/invasive approach. Pt's daughter decided that the family will forgo the MBS and verbalized understanding of the aspiration risk. Educated possible regression of swallow function as dementia persists- dtr verbalized understanding. ST will follow briefly to facilitate safety/efficiency of swallow and for continued education.   HPI HPI: [name] a 83 y.o.male with PMH of dementia,abdominal aortic aneurysm, CAD, CKD, CABG x5 with EF of 60%,throat cancer (1978 per dtr), and HLD presents with increased shortness of breath  and cough. Pt is a resident at [facility] and was running a fever of unknown temperature throughout the day. CXR showed left basilar airspace opacity raises concern for pneumonia.       SLP Plan  Continue with current plan of care;Other (Comment) (possible MBS)       Recommendations  Diet recommendations: Dysphagia 1 (puree);Thin liquid Liquids provided via:  Cup;Teaspoon;No straw Medication Administration: Crushed with puree Supervision: Full supervision/cueing for compensatory strategies;Trained caregiver to feed patient Compensations: Slow rate;Small sips/bites;Minimize environmental distractions Postural Changes and/or Swallow Maneuvers: Seated upright 90 degrees                Oral Care Recommendations: Oral care BID Follow up Recommendations: Skilled Nursing facility SLP Visit Diagnosis: Dysphagia, unspecified (R13.10) Plan: Continue with current plan of care;Other (Comment) (possible MBS)       GO                Fransisca Kaufmann , Student-SLP 05/01/2016, 1:18 PM

## 2016-05-02 DIAGNOSIS — R0902 Hypoxemia: Secondary | ICD-10-CM

## 2016-05-02 LAB — CBC WITH DIFFERENTIAL/PLATELET
BASOS ABS: 0 10*3/uL (ref 0.0–0.1)
BASOS PCT: 0 %
EOS ABS: 0.1 10*3/uL (ref 0.0–0.7)
Eosinophils Relative: 2 %
HCT: 31.6 % — ABNORMAL LOW (ref 39.0–52.0)
HEMOGLOBIN: 10 g/dL — AB (ref 13.0–17.0)
Lymphocytes Relative: 18 %
Lymphs Abs: 1.1 10*3/uL (ref 0.7–4.0)
MCH: 30.5 pg (ref 26.0–34.0)
MCHC: 31.6 g/dL (ref 30.0–36.0)
MCV: 96.3 fL (ref 78.0–100.0)
Monocytes Absolute: 0.9 10*3/uL (ref 0.1–1.0)
Monocytes Relative: 15 %
NEUTROS PCT: 65 %
Neutro Abs: 3.9 10*3/uL (ref 1.7–7.7)
Platelets: 156 10*3/uL (ref 150–400)
RBC: 3.28 MIL/uL — AB (ref 4.22–5.81)
RDW: 14.7 % (ref 11.5–15.5)
WBC: 6.1 10*3/uL (ref 4.0–10.5)

## 2016-05-02 LAB — BASIC METABOLIC PANEL
ANION GAP: 7 (ref 5–15)
BUN: 14 mg/dL (ref 6–20)
CHLORIDE: 108 mmol/L (ref 101–111)
CO2: 27 mmol/L (ref 22–32)
Calcium: 7.9 mg/dL — ABNORMAL LOW (ref 8.9–10.3)
Creatinine, Ser: 0.91 mg/dL (ref 0.61–1.24)
GFR calc non Af Amer: >60 mL/min (ref >60)
Glucose, Bld: 99 mg/dL (ref 65–99)
Potassium: 3.6 mmol/L (ref 3.5–5.1)
SODIUM: 142 mmol/L (ref 135–145)

## 2016-05-02 LAB — MAGNESIUM: Magnesium: 1.8 mg/dL (ref 1.7–2.4)

## 2016-05-02 MED ORDER — AMOXICILLIN-POT CLAVULANATE 250-62.5 MG/5ML PO SUSR: 500.0000 mg | Freq: Two times a day (BID) | ORAL | 0 refills | Status: AC

## 2016-05-02 NOTE — Progress Notes (Signed)
Patient had short run of V.T. 13 beats. Patient awake with no complaints.

## 2016-05-02 NOTE — Care Management Important Message (Signed)
Important Message  Patient Details  Name: [name] MRN: [id number] Date of Birth: Nov 21, 1931   Medicare Important Message Given:  Yes    Nathen May 05/02/2016, 11:37 AM

## 2016-05-02 NOTE — Care Management Note (Signed)
Case Management Note Marvetta Gibbons RN, BSN Unit 2W-Case Manager [phone]  Patient Details  Name: [name] MRN: [id number] Date of Birth: Apr 14, 1931  Subjective/Objective:  Pt admitted with HCAP                  Action/Plan: PTA pt lived at [place] to follow- for ?return vs alternative needs- PC has been consulted for [place].   Expected Discharge Date:  05/02/16               Expected Discharge Plan:  Assisted Living / Rest Home  In-House Referral:  Clinical Social Work  Discharge planning Services  CM Consult  Post Acute Care Choice:  Home Health, Durable Medical Equipment Choice offered to:  NA  DME Arranged:  Oxygen DME Agency:  [place] Arranged:  RN, PT, Speech Therapy [place] Agency:  Other - See comment  Status of Service:  Completed, signed off  If discussed at [place] of Stay Meetings, dates discussed:    Discharge Disposition: ALF- [place] with [facility] services   Additional Comments:  05/02/16- [place] RN, CM- pt for d/c back to [facility] ALF- with [facility] services provided by in house provider at [place], pt will need home 02 to be set up by [facility] - orders placed for HHRN/PT/SLP- CSW aware and to let [place] know- have notified Santiago Glad with [facility] for home 02 needs- will need home 02 delivered to [facility] prior to letting pt go for transport back to ALF. Will await notification of delivery of 02.   Dawayne Patricia, RN 05/02/2016, 10:45 AM

## 2016-05-02 NOTE — Progress Notes (Signed)
SATURATION QUALIFICATIONS: (This note is used to comply with regulatory documentation for home oxygen)  Patient Saturations on Room Air at Rest = 84-86%  Patient Saturations on 3 Liters of oxygen while at rest = 93-94%  Please briefly explain why patient needs home oxygen: pt is unable to ambulate and oxygen at rest on RA is low. See above.

## 2016-05-02 NOTE — Progress Notes (Signed)
  Speech Language Pathology Treatment: Dysphagia  Patient Details Name: [name] MRN: [id number] DOB: 02-28-1931 Today's Date: 05/02/2016 Time: 9977-4142 SLP Time Calculation (min) (ACUTE ONLY): 9 min  Assessment / Plan / Recommendation Clinical Impression  Private CNA present with pt during dysphagia treatment and education. Mr. Cisek required mod-max verbal/visual/tactile cueing to attend to POs due to dementia and confusion. Observed wet vocal quality with thin liquids via straw and noted mild oral holding with pureed solids. Emphasized importance of small sips/bites and upright posture during feeding to maximize safety. Education completed and pt discharged from [place]; signing off.   HPI HPI: [name] a 83 y.o.male with PMH of dementia,abdominal aortic aneurysm, CAD, CKD, CABG x5 with EF of 60%,throat cancer (1978 per dtr), and HLD presents with increased shortness of breath  and cough. Pt is a resident at [facility] and was running a fever of unknown temperature throughout the day. CXR showed left basilar airspace opacity raises concern for pneumonia.       SLP Plan  All goals met       Recommendations  Diet recommendations: Dysphagia 1 (puree);Thin liquid Liquids provided via: Cup;Straw Medication Administration: Crushed with puree Supervision: Full supervision/cueing for compensatory strategies;Trained caregiver to feed patient Compensations: Slow rate;Small sips/bites;Minimize environmental distractions Postural Changes and/or Swallow Maneuvers: Seated upright 90 degrees                Oral Care Recommendations: Oral care BID Follow up Recommendations: Skilled Nursing facility;24 hour supervision/assistance SLP Visit Diagnosis: Dysphagia, unspecified (R13.10) Plan: All goals met       GO                Fransisca Kaufmann , Student-SLP 05/02/2016, 9:32 AM

## 2016-05-02 NOTE — Discharge Summary (Addendum)
Physician Discharge Summary  [name] DZH:[id number] DOB: [birth year] DOA: 04/28/2016  PCP: Garret Reddish, MD  Admit date: 04/28/2016 Discharge date: 05/02/2016  Admitted From: ALF, memory unit  Disposition:  Same  Recommendations for Outpatient Follow-up:  1. Follow up with PCP in 1 week [employer])  2. Please follow up on the following pending results: final blood culture results, negative to date on discharge  3. Please ensure patient stays hydrated with water at bedside with supervision. He may also have Ensure/Boost or other protein supplement drinks between meals.   Home Health: PT OT SLP RN  Equipment/Devices: Home O2    Discharge Condition: Stable CODE STATUS: DNR  Diet recommendations: Dysphagia 1 (puree);Thin liquid Liquids provided via: Cup;Straw. Patient should be supervised when using straw.  Medication Administration: Crushed with puree Supervision: Full supervision/cueing for compensatory strategies;Trained caregiver to feed patient Compensations: Slow rate;Small sips/bites;Minimize environmental distractions Postural Changes and/or Swallow Maneuvers: Seated upright 90 degrees  Brief/Interim Summary: Brett Clinton Barrowis a 83 y.o.malewith medical history significant for remote history of throat cancer in the 1970s, CAD and peripheral vascular disease, dyslipidemia, hypertension with left ventricular diastolic dysfunction, hypothyroidism, GERD, dementia with behavioral disturbance all medical therapy, stage III chronic kidney disease who was sent from the nursing home after developing audible abnormal respiratory sounds and effort associated with productive cough with thick yellow sputum and reported shortness of breath. Upon arrival to the ER patient was found be mildly hypoxemic with room air saturations 90%. Chest x-ray concerning for early left basilar pneumonia.   Patient was treated with broad-spectrum antibiotics. Repeat chest x-ray showed worsening  pneumonia as well. He was evaluated by SLP recommended dysphagia diet. AKI improved with IVF. His vital signs and leukocytosis continued to improve and was transitioned to oral Augmentin suspension. He tolerated this medication well, vital signs and labs remained stable.   Discharge Diagnoses:  Principal Problem:   HCAP (healthcare-associated pneumonia) Active Problems:   Hyperlipidemia   Essential hypertension   CAD (coronary artery disease) s/p bypass graft   GERD   BPH (benign prostatic hyperplasia)   CKD (chronic kidney disease), stage III   Vascular dementia with behavior disturbance   Acute kidney injury ([place])   Acute respiratory failure with hypoxia ([facility])   Left ventricular diastolic dysfunction   Hypothyroidism   Abnormal urinalysis   Acute respiratory failure with hypoxia, with chronic respiratory insufficiency due to chronic and recurrent aspiration -Continue nasal cannula oxygen as needed  Sepsis secondary to HCAP, aspiration  -Repeat chest x-ray obtained with worsening multilobar bronchopneumonia, most severe in the left lower lobe -Blood cultures negative to date, follow up final results  -Continue supportive care, improved on broad-spectrum antibiotics, will deescalate to oral augmentin for total 7 day course  -SLP: moderate dysphagia risk, dysphagia diet as above  AKI on CKD stage 3  -Baseline Cr 0.98 -Resolved on IVF  -Patient to stay hydrated at home   Vascular dementia with behavior disturbance/FTT -Family mentions patient has poor impulse control and they are planning on providing 24/7 sitters -Continue preadmission Depakote, Lexapro, Ativan and Zyprexa, trazodone  Left ventricular diastolic dysfunction -Noted on echocardiogram 2015 and has apparently never been symptomatic therefore not on ACE inhibitor/ARB or diuretics -Echo EF 50-55%   Hypothyroidism -Continue Synthroid  CAD (coronary artery disease) s/p bypass graft -Continue baby  aspirin  BPH (benign prostatic hyperplasia) -Continue pro-scar -Foley removed    Discharge Instructions  Discharge Instructions    Call MD for:  difficulty breathing,  headache or visual disturbances    Complete by:  As directed    Call MD for:  temperature >100.4    Complete by:  As directed    Discharge instructions    Complete by:  As directed    You were cared for by a hospitalist during your hospital stay. If you have any questions about your discharge medications or the care you received while you were in the hospital after you are discharged, you can call the unit and asked to speak with the hospitalist on call if the hospitalist that took care of you is not available. Once you are discharged, your primary care physician will handle any further medical issues. Please note that NO REFILLS for any discharge medications will be authorized once you are discharged, as it is imperative that you return to your primary care physician (or establish a relationship with a primary care physician if you do not have one) for your aftercare needs so that they can reassess your need for medications and monitor your lab values.   Increase activity slowly    Complete by:  As directed      Allergies as of 05/02/2016      Reactions   Avinza [morphine Sulfate] Other (See Comments)   Altered mental status. "about killed him"   Doxycycline Calcium Other (See Comments)   Causes patient chronic colitis; was hospitalized two weeks due to med.   Vibramycin [doxycycline Hyclate] Other (See Comments)   Causes patient colitis; was hospitalized two weeks due to med   Flomax [tamsulosin Hcl] Other (See Comments)   Dizziness   Statins Other (See Comments)   Muscle pain; can tolerate crestor 10 mg every other day   Codeine Sulfate Nausea And Vomiting   Oxycodone Other (See Comments)   Angry; altered mental status; "wild and crazy"      Medication List    TAKE these medications   acetaminophen 500 MG  tablet Commonly known as:  TYLENOL Take 2 tablets (1,000 mg total) by mouth every 8 (eight) hours.   ALPHAGAN P 0.1 % Soln Generic drug:  brimonidine Place 1 drop into both eyes at bedtime.   amoxicillin-clavulanate 250-62.5 MG/5ML suspension Commonly known as:  AUGMENTIN Take 10 mLs (500 mg total) by mouth every 12 (twelve) hours.   aspirin 81 MG tablet Take 1 tablet (81 mg total) by mouth daily. Pt takes 1 tablet by mouth on Mon and Fri.   celecoxib 100 MG capsule Commonly known as:  CELEBREX Take 1 capsule (100 mg total) by mouth 2 (two) times daily.   divalproex 125 MG capsule Commonly known as:  DEPAKOTE SPRINKLE Take 500 mg by mouth 2 (two) times daily.   escitalopram 20 MG tablet Commonly known as:  LEXAPRO TAKE ONE TABLET BY MOUTH ONCE DAILY   feeding supplement (ENSURE ENLIVE) Liqd Take 237 mLs by mouth 2 (two) times daily between meals.   finasteride 5 MG tablet Commonly known as:  PROSCAR Take 5 mg by mouth daily.   levothyroxine 50 MCG tablet Commonly known as:  SYNTHROID, LEVOTHROID Take 50 mcg by mouth daily before breakfast.   LORazepam 1 MG tablet Commonly known as:  ATIVAN Take 0.5-1 tablets (0.5-1 mg total) by mouth 2 (two) times daily as needed (agitation/aggressive behavior).   OLANZapine 5 MG tablet Commonly known as:  ZYPREXA Take 1 tablet (5 mg total) by mouth at bedtime.   polyethylene glycol packet Commonly known as:  MIRALAX / GLYCOLAX Take 17 g by  mouth 2 (two) times daily.   traZODone 50 MG tablet Commonly known as:  DESYREL Take 50 mg by mouth at bedtime.   Vitamin D (Ergocalciferol) 50000 units Caps capsule Commonly known as:  DRISDOL Take 50,000 Units by mouth See admin instructions. Patient takes Tuesday and Saturday            Durable Medical Equipment        Start     Ordered   05/02/16 1034  For home use only DME oxygen  Once    Question Answer Comment  Mode or (Route) Nasal cannula   Liters per Minute 3    Frequency Continuous (stationary and portable oxygen unit needed)   Oxygen delivery system Gas      05/02/16 1033      Allergies  Allergen Reactions  . Avinza [Morphine Sulfate] Other (See Comments)    Altered mental status. "about killed him"  . Doxycycline Calcium Other (See Comments)    Causes patient chronic colitis; was hospitalized two weeks due to med.  . Vibramycin [Doxycycline Hyclate] Other (See Comments)    Causes patient colitis; was hospitalized two weeks due to med  . Flomax [Tamsulosin Hcl] Other (See Comments)    Dizziness  . Statins Other (See Comments)    Muscle pain; can tolerate crestor 10 mg every other day  . Codeine Sulfate Nausea And Vomiting  . Oxycodone Other (See Comments)    Angry; altered mental status; "wild and crazy"    Consultations:  None    Procedures/Studies: Dg Chest 2 View  Result Date: 04/29/2016 CLINICAL DATA:  83 year old male with history of respiratory failure with hypoxia. EXAM: CHEST  2 VIEW COMPARISON:  Chest x-ray 04/28/2016. FINDINGS: Lung volumes are low. Patchy multifocal interstitial and airspace disease throughout the mid to lower lungs bilaterally, asymmetrically distributed, most severe on the left side. Trace left pleural effusion. Mild crowding of the pulmonary vasculature, without frank pulmonary edema. Heart size is upper limits of normal. The patient is rotated to the right on today's exam, resulting in distortion of the mediastinal contours and reduced diagnostic sensitivity and specificity for mediastinal pathology. Aortic atherosclerosis. IMPRESSION: 1. Overall, the appearance the chest suggests worsening multilobar bronchopneumonia, most severe in the left lower lobe, now with a trace left pleural effusion. 2. Aortic atherosclerosis. Electronically Signed   By: Vinnie Langton M.D.   On: 04/29/2016 08:24   Dg Chest Portable 1 View  Result Date: 04/28/2016 CLINICAL DATA:  Acute onset of fever and shortness of breath.  Initial encounter. EXAM: PORTABLE CHEST 1 VIEW COMPARISON:  Chest radiograph performed 11/15/2015 FINDINGS: The lungs are well-aerated. Left basilar airspace opacity raises concern for pneumonia. There is no evidence of pleural effusion or pneumothorax. The cardiomediastinal silhouette is borderline normal in size. The patient is status post median sternotomy. Cervical spinal fusion hardware is noted. No acute osseous abnormalities are seen. IMPRESSION: Left basilar airspace opacity raises concern for pneumonia. Electronically Signed   By: Garald Balding M.D.   On: 04/28/2016 04:27    Echo Study Conclusions  - Left ventricle: The cavity size was normal. Wall thickness was   normal. Systolic function was normal. The estimated ejection   fraction was in the range of 50% to 55%. - Aortic valve: Mildly calcified annulus. - Mitral valve: There was mild regurgitation. - Right ventricle: Systolic function was moderately reduced. - Right atrium: The atrium was mildly dilated. - Tricuspid valve: There was mild-moderate regurgitation. - Pulmonary arteries: Systolic pressure was  moderately increased.   PA peak pressure: 43 mm Hg (S).   Discharge Exam: Vitals:   05/01/16 2040 05/02/16 0329  BP: (!) 153/72 130/83  Pulse: 68 (!) 116  Resp: 18 18  Temp: 97.9 F (36.6 C) 97.5 F (36.4 C)   Vitals:   05/01/16 1326 05/01/16 2040 05/01/16 2043 05/02/16 0329  BP: 140/82 (!) 153/72  130/83  Pulse: (!) 54 68  (!) 116  Resp: 18 18  18   Temp:  97.9 F (36.6 C)  97.5 F (36.4 C)  TempSrc:  Axillary  Axillary  SpO2: 97% (!) 89% 92% 90%  Weight:    76.2 kg (168 lb 1.6 oz)  Height:        General: Pt is alert, awake, not in acute distress, pleasantly demented  Cardiovascular: RRR, S1/S2 +, no rubs, no gallops Respiratory: Coarse bases, no wheezing, no rhonchi, on [place] O2  Abdominal: Soft, NT, ND, bowel sounds + Extremities: no edema, no cyanosis    The results of significant diagnostics from  this hospitalization (including imaging, microbiology, ancillary and laboratory) are listed below for reference.     Microbiology: Recent Results (from the past 240 hour(s))  Blood culture (routine x 2)     Status: None (Preliminary result)   Collection Time: 04/28/16  4:00 AM  Result Value Ref Range Status   Specimen Description BLOOD RIGHT HAND  Final   Special Requests IN PEDIATRIC BOTTLE Blood Culture adequate volume  Final   Culture NO GROWTH 3 DAYS  Final   Report Status PENDING  Incomplete  Blood culture (routine x 2)     Status: None (Preliminary result)   Collection Time: 04/28/16  4:15 AM  Result Value Ref Range Status   Specimen Description BLOOD RIGHT FOREARM  Final   Special Requests   Final    BOTTLES DRAWN AEROBIC AND ANAEROBIC Blood Culture adequate volume   Culture NO GROWTH 3 DAYS  Final   Report Status PENDING  Incomplete  Urine culture     Status: Abnormal   Collection Time: 04/28/16  5:09 AM  Result Value Ref Range Status   Specimen Description URINE, CATHETERIZED  Final   Special Requests NONE  Final   Culture <10,000 COLONIES/mL INSIGNIFICANT GROWTH (A)  Final   Report Status 04/29/2016 FINAL  Final  MRSA PCR Screening     Status: None   Collection Time: 04/28/16 11:28 AM  Result Value Ref Range Status   MRSA by PCR NEGATIVE NEGATIVE Final    Comment:        The GeneXpert MRSA Assay (FDA approved for NASAL specimens only), is one component of a comprehensive MRSA colonization surveillance program. It is not intended to diagnose MRSA infection nor to guide or monitor treatment for MRSA infections.      Labs: BNP (last 3 results)  Recent Labs  04/28/16 0400  BNP 476.5*   Basic Metabolic Panel:  Recent Labs Lab 04/28/16 0400 04/29/16 0316 04/30/16 0239 05/01/16 0310 05/02/16 0210  NA 143 147* 144 146* 142  K 4.4 5.1 3.5 3.4* 3.6  CL 110 113* 116* 111 108  CO2 24 20* 24 26 27   GLUCOSE 90 90 99 89 99  BUN 27* 23* 26* 18 14   CREATININE 1.28* 1.49* 1.27* 1.08 0.91  CALCIUM 8.6* 8.5* 7.8* 7.8* 7.9*  MG  --   --   --   --  1.8   Liver Function Tests:  Recent Labs Lab 04/28/16 0400  AST 19  ALT 13*  ALKPHOS 61  BILITOT 0.7  PROT 5.8*  ALBUMIN 2.9*   No results for input(s): LIPASE, AMYLASE in the last 168 hours. No results for input(s): AMMONIA in the last 168 hours. CBC:  Recent Labs Lab 04/28/16 0415 04/29/16 0316 04/30/16 0239 05/01/16 0310 05/02/16 0210  WBC 8.5 11.5* 7.5 7.2 6.1  NEUTROABS 7.1  --   --  5.2 3.9  HGB 13.3 12.3* 9.9* 10.5* 10.0*  HCT 40.8 39.1 32.0* 32.9* 31.6*  MCV 96.9 100.0 99.1 97.9 96.3  PLT 161 170 147* 149* 156   Cardiac Enzymes:  Recent Labs Lab 04/28/16 0400  TROPONINI <0.03   BNP: Invalid input(s): POCBNP CBG: No results for input(s): GLUCAP in the last 168 hours. D-Dimer No results for input(s): DDIMER in the last 72 hours. Hgb A1c No results for input(s): HGBA1C in the last 72 hours. Lipid Profile No results for input(s): CHOL, HDL, LDLCALC, TRIG, CHOLHDL, LDLDIRECT in the last 72 hours. Thyroid function studies No results for input(s): TSH, T4TOTAL, T3FREE, THYROIDAB in the last 72 hours.  Invalid input(s): FREET3 Anemia work up No results for input(s): VITAMINB12, FOLATE, FERRITIN, TIBC, IRON, RETICCTPCT in the last 72 hours. Urinalysis    Component Value Date/Time   COLORURINE YELLOW 04/28/2016 [place] 04/28/2016 0509   LABSPEC 1.019 04/28/2016 0509   PHURINE 6.0 04/28/2016 0509   GLUCOSEU NEGATIVE 04/28/2016 0509   HGBUR SMALL (A) 04/28/2016 0509   BILIRUBINUR NEGATIVE 04/28/2016 0509   KETONESUR 20 (A) 04/28/2016 0509   PROTEINUR 30 (A) 04/28/2016 0509   UROBILINOGEN 0.2 10/01/2013 1545   NITRITE NEGATIVE 04/28/2016 0509   LEUKOCYTESUR MODERATE (A) 04/28/2016 0509   Sepsis Labs Invalid input(s): PROCALCITONIN,  WBC,  LACTICIDVEN Microbiology Recent Results (from the past 240 hour(s))  Blood culture (routine x  2)     Status: None (Preliminary result)   Collection Time: 04/28/16  4:00 AM  Result Value Ref Range Status   Specimen Description BLOOD RIGHT HAND  Final   Special Requests IN PEDIATRIC BOTTLE Blood Culture adequate volume  Final   Culture NO GROWTH 3 DAYS  Final   Report Status PENDING  Incomplete  Blood culture (routine x 2)     Status: None (Preliminary result)   Collection Time: 04/28/16  4:15 AM  Result Value Ref Range Status   Specimen Description BLOOD RIGHT FOREARM  Final   Special Requests   Final    BOTTLES DRAWN AEROBIC AND ANAEROBIC Blood Culture adequate volume   Culture NO GROWTH 3 DAYS  Final   Report Status PENDING  Incomplete  Urine culture     Status: Abnormal   Collection Time: 04/28/16  5:09 AM  Result Value Ref Range Status   Specimen Description URINE, CATHETERIZED  Final   Special Requests NONE  Final   Culture <10,000 COLONIES/mL INSIGNIFICANT GROWTH (A)  Final   Report Status 04/29/2016 FINAL  Final  MRSA PCR Screening     Status: None   Collection Time: 04/28/16 11:28 AM  Result Value Ref Range Status   MRSA by PCR NEGATIVE NEGATIVE Final    Comment:        The GeneXpert MRSA Assay (FDA approved for NASAL specimens only), is one component of a comprehensive MRSA colonization surveillance program. It is not intended to diagnose MRSA infection nor to guide or monitor treatment for MRSA infections.      Time coordinating discharge: 40 minutes  SIGNED:  Dessa Phi, DO [facility]  Pager [phone]  If 7PM-7AM, please contact night-coverage [email or web address] Password [facility] 05/02/2016, 10:33 AM

## 2016-05-02 NOTE — Progress Notes (Signed)
Clinical Social Worker facilitated patient discharge including contacting patient family and facility to confirm patient discharge plans.  Clinical information faxed to facility and family agreeable with plan.  CSW arranged ambulance transport via [facility] to [facility] .  RN to call [phone] report prior to discharge.  Clinical Social Worker will sign off for now as social work intervention is no longer needed. Please consult [place] again if new need arises.  Rhea Pink, MSW, [place]

## 2016-05-03 DIAGNOSIS — F419 Anxiety disorder, unspecified: Secondary | ICD-10-CM | POA: Diagnosis not present

## 2016-05-03 DIAGNOSIS — G4700 Insomnia, unspecified: Secondary | ICD-10-CM | POA: Diagnosis not present

## 2016-05-03 DIAGNOSIS — R5383 Other fatigue: Secondary | ICD-10-CM | POA: Diagnosis not present

## 2016-05-03 DIAGNOSIS — I10 Essential (primary) hypertension: Secondary | ICD-10-CM | POA: Diagnosis not present

## 2016-05-03 DIAGNOSIS — M1990 Unspecified osteoarthritis, unspecified site: Secondary | ICD-10-CM | POA: Diagnosis not present

## 2016-05-03 DIAGNOSIS — J189 Pneumonia, unspecified organism: Secondary | ICD-10-CM | POA: Diagnosis not present

## 2016-05-03 DIAGNOSIS — R531 Weakness: Secondary | ICD-10-CM | POA: Diagnosis not present

## 2016-05-03 DIAGNOSIS — Z79899 Other long term (current) drug therapy: Secondary | ICD-10-CM | POA: Diagnosis not present

## 2016-05-03 DIAGNOSIS — G8929 Other chronic pain: Secondary | ICD-10-CM | POA: Diagnosis not present

## 2016-05-03 LAB — CULTURE, BLOOD (ROUTINE X 2)
CULTURE: NO GROWTH
Culture: NO GROWTH
Special Requests: ADEQUATE
Special Requests: ADEQUATE

## 2016-05-04 NOTE — Progress Notes (Signed)
Received a call from the SNF about the order for O2 and FL2 that was sent was not signed. Printed a signed FL2 and d/c summary (has the information about the oxygen need) and faxed to the facility.

## 2016-05-09 ENCOUNTER — Encounter: Payer: Self-pay | Admitting: Family Medicine

## 2016-05-11 DIAGNOSIS — R531 Weakness: Secondary | ICD-10-CM | POA: Diagnosis not present

## 2016-05-13 DIAGNOSIS — H409 Unspecified glaucoma: Secondary | ICD-10-CM | POA: Diagnosis not present

## 2016-05-13 DIAGNOSIS — I739 Peripheral vascular disease, unspecified: Secondary | ICD-10-CM | POA: Diagnosis not present

## 2016-05-13 DIAGNOSIS — F0281 Dementia in other diseases classified elsewhere with behavioral disturbance: Secondary | ICD-10-CM | POA: Diagnosis not present

## 2016-05-13 DIAGNOSIS — N183 Chronic kidney disease, stage 3 (moderate): Secondary | ICD-10-CM | POA: Diagnosis not present

## 2016-05-13 DIAGNOSIS — G309 Alzheimer's disease, unspecified: Secondary | ICD-10-CM | POA: Diagnosis not present

## 2016-05-13 DIAGNOSIS — N401 Enlarged prostate with lower urinary tract symptoms: Secondary | ICD-10-CM | POA: Diagnosis not present

## 2016-05-13 DIAGNOSIS — K219 Gastro-esophageal reflux disease without esophagitis: Secondary | ICD-10-CM | POA: Diagnosis not present

## 2016-05-13 DIAGNOSIS — F339 Major depressive disorder, recurrent, unspecified: Secondary | ICD-10-CM | POA: Diagnosis not present

## 2016-05-13 DIAGNOSIS — E039 Hypothyroidism, unspecified: Secondary | ICD-10-CM | POA: Diagnosis not present

## 2016-05-13 DIAGNOSIS — J189 Pneumonia, unspecified organism: Secondary | ICD-10-CM | POA: Diagnosis not present

## 2016-05-13 DIAGNOSIS — E785 Hyperlipidemia, unspecified: Secondary | ICD-10-CM | POA: Diagnosis not present

## 2016-05-13 DIAGNOSIS — I25119 Atherosclerotic heart disease of native coronary artery with unspecified angina pectoris: Secondary | ICD-10-CM | POA: Diagnosis not present

## 2016-05-13 DIAGNOSIS — I5030 Unspecified diastolic (congestive) heart failure: Secondary | ICD-10-CM | POA: Diagnosis not present

## 2016-05-13 DIAGNOSIS — R630 Anorexia: Secondary | ICD-10-CM | POA: Diagnosis not present

## 2016-05-13 DIAGNOSIS — I10 Essential (primary) hypertension: Secondary | ICD-10-CM | POA: Diagnosis not present

## 2016-05-14 DIAGNOSIS — F0281 Dementia in other diseases classified elsewhere with behavioral disturbance: Secondary | ICD-10-CM | POA: Diagnosis not present

## 2016-05-14 DIAGNOSIS — G309 Alzheimer's disease, unspecified: Secondary | ICD-10-CM | POA: Diagnosis not present

## 2016-05-14 DIAGNOSIS — J189 Pneumonia, unspecified organism: Secondary | ICD-10-CM | POA: Diagnosis not present

## 2016-05-14 DIAGNOSIS — I10 Essential (primary) hypertension: Secondary | ICD-10-CM | POA: Diagnosis not present

## 2016-05-14 DIAGNOSIS — R630 Anorexia: Secondary | ICD-10-CM | POA: Diagnosis not present

## 2016-05-14 DIAGNOSIS — I25119 Atherosclerotic heart disease of native coronary artery with unspecified angina pectoris: Secondary | ICD-10-CM | POA: Diagnosis not present

## 2016-05-15 DIAGNOSIS — I25119 Atherosclerotic heart disease of native coronary artery with unspecified angina pectoris: Secondary | ICD-10-CM | POA: Diagnosis not present

## 2016-05-15 DIAGNOSIS — G309 Alzheimer's disease, unspecified: Secondary | ICD-10-CM | POA: Diagnosis not present

## 2016-05-15 DIAGNOSIS — I10 Essential (primary) hypertension: Secondary | ICD-10-CM | POA: Diagnosis not present

## 2016-05-15 DIAGNOSIS — R630 Anorexia: Secondary | ICD-10-CM | POA: Diagnosis not present

## 2016-05-15 DIAGNOSIS — F0281 Dementia in other diseases classified elsewhere with behavioral disturbance: Secondary | ICD-10-CM | POA: Diagnosis not present

## 2016-05-15 DIAGNOSIS — J189 Pneumonia, unspecified organism: Secondary | ICD-10-CM | POA: Diagnosis not present

## 2016-05-16 DIAGNOSIS — I25119 Atherosclerotic heart disease of native coronary artery with unspecified angina pectoris: Secondary | ICD-10-CM | POA: Diagnosis not present

## 2016-05-16 DIAGNOSIS — E039 Hypothyroidism, unspecified: Secondary | ICD-10-CM | POA: Diagnosis not present

## 2016-05-16 DIAGNOSIS — G309 Alzheimer's disease, unspecified: Secondary | ICD-10-CM | POA: Diagnosis not present

## 2016-05-16 DIAGNOSIS — I5030 Unspecified diastolic (congestive) heart failure: Secondary | ICD-10-CM | POA: Diagnosis not present

## 2016-05-16 DIAGNOSIS — N183 Chronic kidney disease, stage 3 (moderate): Secondary | ICD-10-CM | POA: Diagnosis not present

## 2016-05-16 DIAGNOSIS — F339 Major depressive disorder, recurrent, unspecified: Secondary | ICD-10-CM | POA: Diagnosis not present

## 2016-05-16 DIAGNOSIS — J189 Pneumonia, unspecified organism: Secondary | ICD-10-CM | POA: Diagnosis not present

## 2016-05-16 DIAGNOSIS — I10 Essential (primary) hypertension: Secondary | ICD-10-CM | POA: Diagnosis not present

## 2016-05-16 DIAGNOSIS — K219 Gastro-esophageal reflux disease without esophagitis: Secondary | ICD-10-CM | POA: Diagnosis not present

## 2016-05-16 DIAGNOSIS — E785 Hyperlipidemia, unspecified: Secondary | ICD-10-CM | POA: Diagnosis not present

## 2016-05-16 DIAGNOSIS — F0281 Dementia in other diseases classified elsewhere with behavioral disturbance: Secondary | ICD-10-CM | POA: Diagnosis not present

## 2016-05-16 DIAGNOSIS — N401 Enlarged prostate with lower urinary tract symptoms: Secondary | ICD-10-CM | POA: Diagnosis not present

## 2016-05-16 DIAGNOSIS — R630 Anorexia: Secondary | ICD-10-CM | POA: Diagnosis not present

## 2016-05-16 DIAGNOSIS — H409 Unspecified glaucoma: Secondary | ICD-10-CM | POA: Diagnosis not present

## 2016-05-16 DIAGNOSIS — I739 Peripheral vascular disease, unspecified: Secondary | ICD-10-CM | POA: Diagnosis not present

## 2016-05-17 DIAGNOSIS — G309 Alzheimer's disease, unspecified: Secondary | ICD-10-CM | POA: Diagnosis not present

## 2016-05-17 DIAGNOSIS — I10 Essential (primary) hypertension: Secondary | ICD-10-CM | POA: Diagnosis not present

## 2016-05-17 DIAGNOSIS — J189 Pneumonia, unspecified organism: Secondary | ICD-10-CM | POA: Diagnosis not present

## 2016-05-17 DIAGNOSIS — R630 Anorexia: Secondary | ICD-10-CM | POA: Diagnosis not present

## 2016-05-17 DIAGNOSIS — I25119 Atherosclerotic heart disease of native coronary artery with unspecified angina pectoris: Secondary | ICD-10-CM | POA: Diagnosis not present

## 2016-05-17 DIAGNOSIS — F0281 Dementia in other diseases classified elsewhere with behavioral disturbance: Secondary | ICD-10-CM | POA: Diagnosis not present

## 2016-05-19 DIAGNOSIS — G309 Alzheimer's disease, unspecified: Secondary | ICD-10-CM | POA: Diagnosis not present

## 2016-05-19 DIAGNOSIS — I10 Essential (primary) hypertension: Secondary | ICD-10-CM | POA: Diagnosis not present

## 2016-05-19 DIAGNOSIS — I25119 Atherosclerotic heart disease of native coronary artery with unspecified angina pectoris: Secondary | ICD-10-CM | POA: Diagnosis not present

## 2016-05-19 DIAGNOSIS — F0281 Dementia in other diseases classified elsewhere with behavioral disturbance: Secondary | ICD-10-CM | POA: Diagnosis not present

## 2016-05-19 DIAGNOSIS — R630 Anorexia: Secondary | ICD-10-CM | POA: Diagnosis not present

## 2016-05-19 DIAGNOSIS — J189 Pneumonia, unspecified organism: Secondary | ICD-10-CM | POA: Diagnosis not present

## 2016-05-22 DIAGNOSIS — I10 Essential (primary) hypertension: Secondary | ICD-10-CM | POA: Diagnosis not present

## 2016-05-22 DIAGNOSIS — F0281 Dementia in other diseases classified elsewhere with behavioral disturbance: Secondary | ICD-10-CM | POA: Diagnosis not present

## 2016-05-22 DIAGNOSIS — J189 Pneumonia, unspecified organism: Secondary | ICD-10-CM | POA: Diagnosis not present

## 2016-05-22 DIAGNOSIS — I25119 Atherosclerotic heart disease of native coronary artery with unspecified angina pectoris: Secondary | ICD-10-CM | POA: Diagnosis not present

## 2016-05-22 DIAGNOSIS — R630 Anorexia: Secondary | ICD-10-CM | POA: Diagnosis not present

## 2016-05-22 DIAGNOSIS — G309 Alzheimer's disease, unspecified: Secondary | ICD-10-CM | POA: Diagnosis not present

## 2016-05-24 DIAGNOSIS — M1990 Unspecified osteoarthritis, unspecified site: Secondary | ICD-10-CM | POA: Diagnosis not present

## 2016-05-24 DIAGNOSIS — G309 Alzheimer's disease, unspecified: Secondary | ICD-10-CM | POA: Diagnosis not present

## 2016-05-24 DIAGNOSIS — R5383 Other fatigue: Secondary | ICD-10-CM | POA: Diagnosis not present

## 2016-05-24 DIAGNOSIS — I10 Essential (primary) hypertension: Secondary | ICD-10-CM | POA: Diagnosis not present

## 2016-05-24 DIAGNOSIS — I25119 Atherosclerotic heart disease of native coronary artery with unspecified angina pectoris: Secondary | ICD-10-CM | POA: Diagnosis not present

## 2016-05-24 DIAGNOSIS — F419 Anxiety disorder, unspecified: Secondary | ICD-10-CM | POA: Diagnosis not present

## 2016-05-24 DIAGNOSIS — J189 Pneumonia, unspecified organism: Secondary | ICD-10-CM | POA: Diagnosis not present

## 2016-05-24 DIAGNOSIS — F0281 Dementia in other diseases classified elsewhere with behavioral disturbance: Secondary | ICD-10-CM | POA: Diagnosis not present

## 2016-05-24 DIAGNOSIS — R630 Anorexia: Secondary | ICD-10-CM | POA: Diagnosis not present

## 2016-05-24 DIAGNOSIS — Z79899 Other long term (current) drug therapy: Secondary | ICD-10-CM | POA: Diagnosis not present

## 2016-05-24 DIAGNOSIS — G4700 Insomnia, unspecified: Secondary | ICD-10-CM | POA: Diagnosis not present

## 2016-05-26 DIAGNOSIS — R630 Anorexia: Secondary | ICD-10-CM | POA: Diagnosis not present

## 2016-05-26 DIAGNOSIS — G309 Alzheimer's disease, unspecified: Secondary | ICD-10-CM | POA: Diagnosis not present

## 2016-05-26 DIAGNOSIS — F0281 Dementia in other diseases classified elsewhere with behavioral disturbance: Secondary | ICD-10-CM | POA: Diagnosis not present

## 2016-05-26 DIAGNOSIS — I10 Essential (primary) hypertension: Secondary | ICD-10-CM | POA: Diagnosis not present

## 2016-05-26 DIAGNOSIS — J189 Pneumonia, unspecified organism: Secondary | ICD-10-CM | POA: Diagnosis not present

## 2016-05-26 DIAGNOSIS — I25119 Atherosclerotic heart disease of native coronary artery with unspecified angina pectoris: Secondary | ICD-10-CM | POA: Diagnosis not present

## 2016-05-30 DIAGNOSIS — J189 Pneumonia, unspecified organism: Secondary | ICD-10-CM | POA: Diagnosis not present

## 2016-05-30 DIAGNOSIS — R630 Anorexia: Secondary | ICD-10-CM | POA: Diagnosis not present

## 2016-05-30 DIAGNOSIS — F0281 Dementia in other diseases classified elsewhere with behavioral disturbance: Secondary | ICD-10-CM | POA: Diagnosis not present

## 2016-05-30 DIAGNOSIS — I25119 Atherosclerotic heart disease of native coronary artery with unspecified angina pectoris: Secondary | ICD-10-CM | POA: Diagnosis not present

## 2016-05-30 DIAGNOSIS — I10 Essential (primary) hypertension: Secondary | ICD-10-CM | POA: Diagnosis not present

## 2016-05-30 DIAGNOSIS — G309 Alzheimer's disease, unspecified: Secondary | ICD-10-CM | POA: Diagnosis not present

## 2016-05-31 DIAGNOSIS — G309 Alzheimer's disease, unspecified: Secondary | ICD-10-CM | POA: Diagnosis not present

## 2016-05-31 DIAGNOSIS — I10 Essential (primary) hypertension: Secondary | ICD-10-CM | POA: Diagnosis not present

## 2016-05-31 DIAGNOSIS — I25119 Atherosclerotic heart disease of native coronary artery with unspecified angina pectoris: Secondary | ICD-10-CM | POA: Diagnosis not present

## 2016-05-31 DIAGNOSIS — F0281 Dementia in other diseases classified elsewhere with behavioral disturbance: Secondary | ICD-10-CM | POA: Diagnosis not present

## 2016-05-31 DIAGNOSIS — J189 Pneumonia, unspecified organism: Secondary | ICD-10-CM | POA: Diagnosis not present

## 2016-05-31 DIAGNOSIS — R630 Anorexia: Secondary | ICD-10-CM | POA: Diagnosis not present

## 2016-06-02 DIAGNOSIS — I25119 Atherosclerotic heart disease of native coronary artery with unspecified angina pectoris: Secondary | ICD-10-CM | POA: Diagnosis not present

## 2016-06-02 DIAGNOSIS — J189 Pneumonia, unspecified organism: Secondary | ICD-10-CM | POA: Diagnosis not present

## 2016-06-02 DIAGNOSIS — F0281 Dementia in other diseases classified elsewhere with behavioral disturbance: Secondary | ICD-10-CM | POA: Diagnosis not present

## 2016-06-02 DIAGNOSIS — G309 Alzheimer's disease, unspecified: Secondary | ICD-10-CM | POA: Diagnosis not present

## 2016-06-02 DIAGNOSIS — I10 Essential (primary) hypertension: Secondary | ICD-10-CM | POA: Diagnosis not present

## 2016-06-02 DIAGNOSIS — R630 Anorexia: Secondary | ICD-10-CM | POA: Diagnosis not present

## 2016-06-05 DIAGNOSIS — R630 Anorexia: Secondary | ICD-10-CM | POA: Diagnosis not present

## 2016-06-05 DIAGNOSIS — F0281 Dementia in other diseases classified elsewhere with behavioral disturbance: Secondary | ICD-10-CM | POA: Diagnosis not present

## 2016-06-05 DIAGNOSIS — I10 Essential (primary) hypertension: Secondary | ICD-10-CM | POA: Diagnosis not present

## 2016-06-05 DIAGNOSIS — I25119 Atherosclerotic heart disease of native coronary artery with unspecified angina pectoris: Secondary | ICD-10-CM | POA: Diagnosis not present

## 2016-06-05 DIAGNOSIS — G309 Alzheimer's disease, unspecified: Secondary | ICD-10-CM | POA: Diagnosis not present

## 2016-06-05 DIAGNOSIS — J189 Pneumonia, unspecified organism: Secondary | ICD-10-CM | POA: Diagnosis not present

## 2016-06-06 DIAGNOSIS — R630 Anorexia: Secondary | ICD-10-CM | POA: Diagnosis not present

## 2016-06-06 DIAGNOSIS — I25119 Atherosclerotic heart disease of native coronary artery with unspecified angina pectoris: Secondary | ICD-10-CM | POA: Diagnosis not present

## 2016-06-06 DIAGNOSIS — F0281 Dementia in other diseases classified elsewhere with behavioral disturbance: Secondary | ICD-10-CM | POA: Diagnosis not present

## 2016-06-06 DIAGNOSIS — J189 Pneumonia, unspecified organism: Secondary | ICD-10-CM | POA: Diagnosis not present

## 2016-06-06 DIAGNOSIS — I10 Essential (primary) hypertension: Secondary | ICD-10-CM | POA: Diagnosis not present

## 2016-06-06 DIAGNOSIS — G309 Alzheimer's disease, unspecified: Secondary | ICD-10-CM | POA: Diagnosis not present

## 2016-06-07 DIAGNOSIS — I25119 Atherosclerotic heart disease of native coronary artery with unspecified angina pectoris: Secondary | ICD-10-CM | POA: Diagnosis not present

## 2016-06-07 DIAGNOSIS — G309 Alzheimer's disease, unspecified: Secondary | ICD-10-CM | POA: Diagnosis not present

## 2016-06-07 DIAGNOSIS — I10 Essential (primary) hypertension: Secondary | ICD-10-CM | POA: Diagnosis not present

## 2016-06-07 DIAGNOSIS — R630 Anorexia: Secondary | ICD-10-CM | POA: Diagnosis not present

## 2016-06-07 DIAGNOSIS — J189 Pneumonia, unspecified organism: Secondary | ICD-10-CM | POA: Diagnosis not present

## 2016-06-07 DIAGNOSIS — F0281 Dementia in other diseases classified elsewhere with behavioral disturbance: Secondary | ICD-10-CM | POA: Diagnosis not present

## 2016-06-08 DIAGNOSIS — J189 Pneumonia, unspecified organism: Secondary | ICD-10-CM | POA: Diagnosis not present

## 2016-06-08 DIAGNOSIS — R630 Anorexia: Secondary | ICD-10-CM | POA: Diagnosis not present

## 2016-06-08 DIAGNOSIS — I25119 Atherosclerotic heart disease of native coronary artery with unspecified angina pectoris: Secondary | ICD-10-CM | POA: Diagnosis not present

## 2016-06-08 DIAGNOSIS — G309 Alzheimer's disease, unspecified: Secondary | ICD-10-CM | POA: Diagnosis not present

## 2016-06-08 DIAGNOSIS — F0281 Dementia in other diseases classified elsewhere with behavioral disturbance: Secondary | ICD-10-CM | POA: Diagnosis not present

## 2016-06-08 DIAGNOSIS — I10 Essential (primary) hypertension: Secondary | ICD-10-CM | POA: Diagnosis not present

## 2016-06-10 DIAGNOSIS — G309 Alzheimer's disease, unspecified: Secondary | ICD-10-CM | POA: Diagnosis not present

## 2016-06-10 DIAGNOSIS — F0281 Dementia in other diseases classified elsewhere with behavioral disturbance: Secondary | ICD-10-CM | POA: Diagnosis not present

## 2016-06-10 DIAGNOSIS — I10 Essential (primary) hypertension: Secondary | ICD-10-CM | POA: Diagnosis not present

## 2016-06-10 DIAGNOSIS — I25119 Atherosclerotic heart disease of native coronary artery with unspecified angina pectoris: Secondary | ICD-10-CM | POA: Diagnosis not present

## 2016-06-10 DIAGNOSIS — R630 Anorexia: Secondary | ICD-10-CM | POA: Diagnosis not present

## 2016-06-10 DIAGNOSIS — J189 Pneumonia, unspecified organism: Secondary | ICD-10-CM | POA: Diagnosis not present

## 2016-06-13 DIAGNOSIS — F0281 Dementia in other diseases classified elsewhere with behavioral disturbance: Secondary | ICD-10-CM | POA: Diagnosis not present

## 2016-06-13 DIAGNOSIS — I25119 Atherosclerotic heart disease of native coronary artery with unspecified angina pectoris: Secondary | ICD-10-CM | POA: Diagnosis not present

## 2016-06-13 DIAGNOSIS — I10 Essential (primary) hypertension: Secondary | ICD-10-CM | POA: Diagnosis not present

## 2016-06-13 DIAGNOSIS — G309 Alzheimer's disease, unspecified: Secondary | ICD-10-CM | POA: Diagnosis not present

## 2016-06-13 DIAGNOSIS — J189 Pneumonia, unspecified organism: Secondary | ICD-10-CM | POA: Diagnosis not present

## 2016-06-13 DIAGNOSIS — R630 Anorexia: Secondary | ICD-10-CM | POA: Diagnosis not present

## 2016-06-14 DIAGNOSIS — J189 Pneumonia, unspecified organism: Secondary | ICD-10-CM | POA: Diagnosis not present

## 2016-06-14 DIAGNOSIS — R630 Anorexia: Secondary | ICD-10-CM | POA: Diagnosis not present

## 2016-06-14 DIAGNOSIS — G309 Alzheimer's disease, unspecified: Secondary | ICD-10-CM | POA: Diagnosis not present

## 2016-06-14 DIAGNOSIS — F0281 Dementia in other diseases classified elsewhere with behavioral disturbance: Secondary | ICD-10-CM | POA: Diagnosis not present

## 2016-06-14 DIAGNOSIS — I10 Essential (primary) hypertension: Secondary | ICD-10-CM | POA: Diagnosis not present

## 2016-06-14 DIAGNOSIS — I25119 Atherosclerotic heart disease of native coronary artery with unspecified angina pectoris: Secondary | ICD-10-CM | POA: Diagnosis not present

## 2016-06-15 DIAGNOSIS — F0281 Dementia in other diseases classified elsewhere with behavioral disturbance: Secondary | ICD-10-CM | POA: Diagnosis not present

## 2016-06-15 DIAGNOSIS — I10 Essential (primary) hypertension: Secondary | ICD-10-CM | POA: Diagnosis not present

## 2016-06-15 DIAGNOSIS — I25119 Atherosclerotic heart disease of native coronary artery with unspecified angina pectoris: Secondary | ICD-10-CM | POA: Diagnosis not present

## 2016-06-15 DIAGNOSIS — R630 Anorexia: Secondary | ICD-10-CM | POA: Diagnosis not present

## 2016-06-15 DIAGNOSIS — J189 Pneumonia, unspecified organism: Secondary | ICD-10-CM | POA: Diagnosis not present

## 2016-06-15 DIAGNOSIS — G309 Alzheimer's disease, unspecified: Secondary | ICD-10-CM | POA: Diagnosis not present

## 2016-06-16 DIAGNOSIS — E039 Hypothyroidism, unspecified: Secondary | ICD-10-CM | POA: Diagnosis not present

## 2016-06-16 DIAGNOSIS — F339 Major depressive disorder, recurrent, unspecified: Secondary | ICD-10-CM | POA: Diagnosis not present

## 2016-06-16 DIAGNOSIS — H409 Unspecified glaucoma: Secondary | ICD-10-CM | POA: Diagnosis not present

## 2016-06-16 DIAGNOSIS — N401 Enlarged prostate with lower urinary tract symptoms: Secondary | ICD-10-CM | POA: Diagnosis not present

## 2016-06-16 DIAGNOSIS — I10 Essential (primary) hypertension: Secondary | ICD-10-CM | POA: Diagnosis not present

## 2016-06-16 DIAGNOSIS — K219 Gastro-esophageal reflux disease without esophagitis: Secondary | ICD-10-CM | POA: Diagnosis not present

## 2016-06-16 DIAGNOSIS — E785 Hyperlipidemia, unspecified: Secondary | ICD-10-CM | POA: Diagnosis not present

## 2016-06-16 DIAGNOSIS — N183 Chronic kidney disease, stage 3 (moderate): Secondary | ICD-10-CM | POA: Diagnosis not present

## 2016-06-16 DIAGNOSIS — I739 Peripheral vascular disease, unspecified: Secondary | ICD-10-CM | POA: Diagnosis not present

## 2016-06-16 DIAGNOSIS — I5030 Unspecified diastolic (congestive) heart failure: Secondary | ICD-10-CM | POA: Diagnosis not present

## 2016-06-16 DIAGNOSIS — I25119 Atherosclerotic heart disease of native coronary artery with unspecified angina pectoris: Secondary | ICD-10-CM | POA: Diagnosis not present

## 2016-06-16 DIAGNOSIS — R630 Anorexia: Secondary | ICD-10-CM | POA: Diagnosis not present

## 2016-06-16 DIAGNOSIS — F0281 Dementia in other diseases classified elsewhere with behavioral disturbance: Secondary | ICD-10-CM | POA: Diagnosis not present

## 2016-06-16 DIAGNOSIS — J189 Pneumonia, unspecified organism: Secondary | ICD-10-CM | POA: Diagnosis not present

## 2016-06-16 DIAGNOSIS — G309 Alzheimer's disease, unspecified: Secondary | ICD-10-CM | POA: Diagnosis not present

## 2016-06-19 DIAGNOSIS — I25119 Atherosclerotic heart disease of native coronary artery with unspecified angina pectoris: Secondary | ICD-10-CM | POA: Diagnosis not present

## 2016-06-19 DIAGNOSIS — I10 Essential (primary) hypertension: Secondary | ICD-10-CM | POA: Diagnosis not present

## 2016-06-19 DIAGNOSIS — R630 Anorexia: Secondary | ICD-10-CM | POA: Diagnosis not present

## 2016-06-19 DIAGNOSIS — G309 Alzheimer's disease, unspecified: Secondary | ICD-10-CM | POA: Diagnosis not present

## 2016-06-19 DIAGNOSIS — F0281 Dementia in other diseases classified elsewhere with behavioral disturbance: Secondary | ICD-10-CM | POA: Diagnosis not present

## 2016-06-19 DIAGNOSIS — J189 Pneumonia, unspecified organism: Secondary | ICD-10-CM | POA: Diagnosis not present

## 2016-06-21 DIAGNOSIS — I25119 Atherosclerotic heart disease of native coronary artery with unspecified angina pectoris: Secondary | ICD-10-CM | POA: Diagnosis not present

## 2016-06-21 DIAGNOSIS — R630 Anorexia: Secondary | ICD-10-CM | POA: Diagnosis not present

## 2016-06-21 DIAGNOSIS — G309 Alzheimer's disease, unspecified: Secondary | ICD-10-CM | POA: Diagnosis not present

## 2016-06-21 DIAGNOSIS — I10 Essential (primary) hypertension: Secondary | ICD-10-CM | POA: Diagnosis not present

## 2016-06-21 DIAGNOSIS — F0281 Dementia in other diseases classified elsewhere with behavioral disturbance: Secondary | ICD-10-CM | POA: Diagnosis not present

## 2016-06-21 DIAGNOSIS — J189 Pneumonia, unspecified organism: Secondary | ICD-10-CM | POA: Diagnosis not present

## 2016-06-24 DIAGNOSIS — R630 Anorexia: Secondary | ICD-10-CM | POA: Diagnosis not present

## 2016-06-24 DIAGNOSIS — I10 Essential (primary) hypertension: Secondary | ICD-10-CM | POA: Diagnosis not present

## 2016-06-24 DIAGNOSIS — J189 Pneumonia, unspecified organism: Secondary | ICD-10-CM | POA: Diagnosis not present

## 2016-06-24 DIAGNOSIS — F0281 Dementia in other diseases classified elsewhere with behavioral disturbance: Secondary | ICD-10-CM | POA: Diagnosis not present

## 2016-06-24 DIAGNOSIS — I25119 Atherosclerotic heart disease of native coronary artery with unspecified angina pectoris: Secondary | ICD-10-CM | POA: Diagnosis not present

## 2016-06-24 DIAGNOSIS — G309 Alzheimer's disease, unspecified: Secondary | ICD-10-CM | POA: Diagnosis not present

## 2016-06-28 DIAGNOSIS — I25119 Atherosclerotic heart disease of native coronary artery with unspecified angina pectoris: Secondary | ICD-10-CM | POA: Diagnosis not present

## 2016-06-28 DIAGNOSIS — G309 Alzheimer's disease, unspecified: Secondary | ICD-10-CM | POA: Diagnosis not present

## 2016-06-28 DIAGNOSIS — R630 Anorexia: Secondary | ICD-10-CM | POA: Diagnosis not present

## 2016-06-28 DIAGNOSIS — J189 Pneumonia, unspecified organism: Secondary | ICD-10-CM | POA: Diagnosis not present

## 2016-06-28 DIAGNOSIS — F0281 Dementia in other diseases classified elsewhere with behavioral disturbance: Secondary | ICD-10-CM | POA: Diagnosis not present

## 2016-06-28 DIAGNOSIS — I10 Essential (primary) hypertension: Secondary | ICD-10-CM | POA: Diagnosis not present

## 2016-06-29 DIAGNOSIS — I25119 Atherosclerotic heart disease of native coronary artery with unspecified angina pectoris: Secondary | ICD-10-CM | POA: Diagnosis not present

## 2016-06-29 DIAGNOSIS — F0281 Dementia in other diseases classified elsewhere with behavioral disturbance: Secondary | ICD-10-CM | POA: Diagnosis not present

## 2016-06-29 DIAGNOSIS — G309 Alzheimer's disease, unspecified: Secondary | ICD-10-CM | POA: Diagnosis not present

## 2016-06-29 DIAGNOSIS — R630 Anorexia: Secondary | ICD-10-CM | POA: Diagnosis not present

## 2016-06-29 DIAGNOSIS — J189 Pneumonia, unspecified organism: Secondary | ICD-10-CM | POA: Diagnosis not present

## 2016-06-29 DIAGNOSIS — I10 Essential (primary) hypertension: Secondary | ICD-10-CM | POA: Diagnosis not present

## 2016-07-03 DIAGNOSIS — I10 Essential (primary) hypertension: Secondary | ICD-10-CM | POA: Diagnosis not present

## 2016-07-03 DIAGNOSIS — G309 Alzheimer's disease, unspecified: Secondary | ICD-10-CM | POA: Diagnosis not present

## 2016-07-03 DIAGNOSIS — F0281 Dementia in other diseases classified elsewhere with behavioral disturbance: Secondary | ICD-10-CM | POA: Diagnosis not present

## 2016-07-03 DIAGNOSIS — R630 Anorexia: Secondary | ICD-10-CM | POA: Diagnosis not present

## 2016-07-03 DIAGNOSIS — J189 Pneumonia, unspecified organism: Secondary | ICD-10-CM | POA: Diagnosis not present

## 2016-07-03 DIAGNOSIS — I25119 Atherosclerotic heart disease of native coronary artery with unspecified angina pectoris: Secondary | ICD-10-CM | POA: Diagnosis not present

## 2016-07-04 DIAGNOSIS — R630 Anorexia: Secondary | ICD-10-CM | POA: Diagnosis not present

## 2016-07-04 DIAGNOSIS — I10 Essential (primary) hypertension: Secondary | ICD-10-CM | POA: Diagnosis not present

## 2016-07-04 DIAGNOSIS — F0281 Dementia in other diseases classified elsewhere with behavioral disturbance: Secondary | ICD-10-CM | POA: Diagnosis not present

## 2016-07-04 DIAGNOSIS — G309 Alzheimer's disease, unspecified: Secondary | ICD-10-CM | POA: Diagnosis not present

## 2016-07-04 DIAGNOSIS — J189 Pneumonia, unspecified organism: Secondary | ICD-10-CM | POA: Diagnosis not present

## 2016-07-04 DIAGNOSIS — I25119 Atherosclerotic heart disease of native coronary artery with unspecified angina pectoris: Secondary | ICD-10-CM | POA: Diagnosis not present

## 2016-07-05 DIAGNOSIS — R634 Abnormal weight loss: Secondary | ICD-10-CM | POA: Diagnosis not present

## 2016-07-05 DIAGNOSIS — G309 Alzheimer's disease, unspecified: Secondary | ICD-10-CM | POA: Diagnosis not present

## 2016-07-05 DIAGNOSIS — G4700 Insomnia, unspecified: Secondary | ICD-10-CM | POA: Diagnosis not present

## 2016-07-05 DIAGNOSIS — M1990 Unspecified osteoarthritis, unspecified site: Secondary | ICD-10-CM | POA: Diagnosis not present

## 2016-07-05 DIAGNOSIS — Z79899 Other long term (current) drug therapy: Secondary | ICD-10-CM | POA: Diagnosis not present

## 2016-07-05 DIAGNOSIS — I25119 Atherosclerotic heart disease of native coronary artery with unspecified angina pectoris: Secondary | ICD-10-CM | POA: Diagnosis not present

## 2016-07-05 DIAGNOSIS — R630 Anorexia: Secondary | ICD-10-CM | POA: Diagnosis not present

## 2016-07-05 DIAGNOSIS — J189 Pneumonia, unspecified organism: Secondary | ICD-10-CM | POA: Diagnosis not present

## 2016-07-05 DIAGNOSIS — F0281 Dementia in other diseases classified elsewhere with behavioral disturbance: Secondary | ICD-10-CM | POA: Diagnosis not present

## 2016-07-05 DIAGNOSIS — F419 Anxiety disorder, unspecified: Secondary | ICD-10-CM | POA: Diagnosis not present

## 2016-07-05 DIAGNOSIS — R5383 Other fatigue: Secondary | ICD-10-CM | POA: Diagnosis not present

## 2016-07-05 DIAGNOSIS — I10 Essential (primary) hypertension: Secondary | ICD-10-CM | POA: Diagnosis not present

## 2016-07-08 DIAGNOSIS — I25119 Atherosclerotic heart disease of native coronary artery with unspecified angina pectoris: Secondary | ICD-10-CM | POA: Diagnosis not present

## 2016-07-08 DIAGNOSIS — F0281 Dementia in other diseases classified elsewhere with behavioral disturbance: Secondary | ICD-10-CM | POA: Diagnosis not present

## 2016-07-08 DIAGNOSIS — R630 Anorexia: Secondary | ICD-10-CM | POA: Diagnosis not present

## 2016-07-08 DIAGNOSIS — I10 Essential (primary) hypertension: Secondary | ICD-10-CM | POA: Diagnosis not present

## 2016-07-08 DIAGNOSIS — J189 Pneumonia, unspecified organism: Secondary | ICD-10-CM | POA: Diagnosis not present

## 2016-07-08 DIAGNOSIS — G309 Alzheimer's disease, unspecified: Secondary | ICD-10-CM | POA: Diagnosis not present

## 2016-07-11 DIAGNOSIS — J189 Pneumonia, unspecified organism: Secondary | ICD-10-CM | POA: Diagnosis not present

## 2016-07-11 DIAGNOSIS — G309 Alzheimer's disease, unspecified: Secondary | ICD-10-CM | POA: Diagnosis not present

## 2016-07-11 DIAGNOSIS — I10 Essential (primary) hypertension: Secondary | ICD-10-CM | POA: Diagnosis not present

## 2016-07-11 DIAGNOSIS — R630 Anorexia: Secondary | ICD-10-CM | POA: Diagnosis not present

## 2016-07-11 DIAGNOSIS — F0281 Dementia in other diseases classified elsewhere with behavioral disturbance: Secondary | ICD-10-CM | POA: Diagnosis not present

## 2016-07-11 DIAGNOSIS — I25119 Atherosclerotic heart disease of native coronary artery with unspecified angina pectoris: Secondary | ICD-10-CM | POA: Diagnosis not present

## 2016-07-12 DIAGNOSIS — G309 Alzheimer's disease, unspecified: Secondary | ICD-10-CM | POA: Diagnosis not present

## 2016-07-12 DIAGNOSIS — I10 Essential (primary) hypertension: Secondary | ICD-10-CM | POA: Diagnosis not present

## 2016-07-12 DIAGNOSIS — F0281 Dementia in other diseases classified elsewhere with behavioral disturbance: Secondary | ICD-10-CM | POA: Diagnosis not present

## 2016-07-12 DIAGNOSIS — J189 Pneumonia, unspecified organism: Secondary | ICD-10-CM | POA: Diagnosis not present

## 2016-07-12 DIAGNOSIS — R630 Anorexia: Secondary | ICD-10-CM | POA: Diagnosis not present

## 2016-07-12 DIAGNOSIS — I25119 Atherosclerotic heart disease of native coronary artery with unspecified angina pectoris: Secondary | ICD-10-CM | POA: Diagnosis not present

## 2016-07-16 DIAGNOSIS — N401 Enlarged prostate with lower urinary tract symptoms: Secondary | ICD-10-CM | POA: Diagnosis not present

## 2016-07-16 DIAGNOSIS — F0281 Dementia in other diseases classified elsewhere with behavioral disturbance: Secondary | ICD-10-CM | POA: Diagnosis not present

## 2016-07-16 DIAGNOSIS — K219 Gastro-esophageal reflux disease without esophagitis: Secondary | ICD-10-CM | POA: Diagnosis not present

## 2016-07-16 DIAGNOSIS — E785 Hyperlipidemia, unspecified: Secondary | ICD-10-CM | POA: Diagnosis not present

## 2016-07-16 DIAGNOSIS — I5030 Unspecified diastolic (congestive) heart failure: Secondary | ICD-10-CM | POA: Diagnosis not present

## 2016-07-16 DIAGNOSIS — H409 Unspecified glaucoma: Secondary | ICD-10-CM | POA: Diagnosis not present

## 2016-07-16 DIAGNOSIS — N183 Chronic kidney disease, stage 3 (moderate): Secondary | ICD-10-CM | POA: Diagnosis not present

## 2016-07-16 DIAGNOSIS — I25119 Atherosclerotic heart disease of native coronary artery with unspecified angina pectoris: Secondary | ICD-10-CM | POA: Diagnosis not present

## 2016-07-16 DIAGNOSIS — F339 Major depressive disorder, recurrent, unspecified: Secondary | ICD-10-CM | POA: Diagnosis not present

## 2016-07-16 DIAGNOSIS — E039 Hypothyroidism, unspecified: Secondary | ICD-10-CM | POA: Diagnosis not present

## 2016-07-16 DIAGNOSIS — R630 Anorexia: Secondary | ICD-10-CM | POA: Diagnosis not present

## 2016-07-16 DIAGNOSIS — G309 Alzheimer's disease, unspecified: Secondary | ICD-10-CM | POA: Diagnosis not present

## 2016-07-16 DIAGNOSIS — J189 Pneumonia, unspecified organism: Secondary | ICD-10-CM | POA: Diagnosis not present

## 2016-07-16 DIAGNOSIS — I739 Peripheral vascular disease, unspecified: Secondary | ICD-10-CM | POA: Diagnosis not present

## 2016-07-16 DIAGNOSIS — I10 Essential (primary) hypertension: Secondary | ICD-10-CM | POA: Diagnosis not present

## 2016-07-18 DIAGNOSIS — I10 Essential (primary) hypertension: Secondary | ICD-10-CM | POA: Diagnosis not present

## 2016-07-18 DIAGNOSIS — F0281 Dementia in other diseases classified elsewhere with behavioral disturbance: Secondary | ICD-10-CM | POA: Diagnosis not present

## 2016-07-18 DIAGNOSIS — J189 Pneumonia, unspecified organism: Secondary | ICD-10-CM | POA: Diagnosis not present

## 2016-07-18 DIAGNOSIS — G309 Alzheimer's disease, unspecified: Secondary | ICD-10-CM | POA: Diagnosis not present

## 2016-07-18 DIAGNOSIS — R630 Anorexia: Secondary | ICD-10-CM | POA: Diagnosis not present

## 2016-07-18 DIAGNOSIS — I25119 Atherosclerotic heart disease of native coronary artery with unspecified angina pectoris: Secondary | ICD-10-CM | POA: Diagnosis not present

## 2016-07-19 DIAGNOSIS — J189 Pneumonia, unspecified organism: Secondary | ICD-10-CM | POA: Diagnosis not present

## 2016-07-19 DIAGNOSIS — G4700 Insomnia, unspecified: Secondary | ICD-10-CM | POA: Diagnosis not present

## 2016-07-19 DIAGNOSIS — F419 Anxiety disorder, unspecified: Secondary | ICD-10-CM | POA: Diagnosis not present

## 2016-07-19 DIAGNOSIS — F0281 Dementia in other diseases classified elsewhere with behavioral disturbance: Secondary | ICD-10-CM | POA: Diagnosis not present

## 2016-07-19 DIAGNOSIS — R630 Anorexia: Secondary | ICD-10-CM | POA: Diagnosis not present

## 2016-07-19 DIAGNOSIS — Z79899 Other long term (current) drug therapy: Secondary | ICD-10-CM | POA: Diagnosis not present

## 2016-07-19 DIAGNOSIS — I25119 Atherosclerotic heart disease of native coronary artery with unspecified angina pectoris: Secondary | ICD-10-CM | POA: Diagnosis not present

## 2016-07-19 DIAGNOSIS — I10 Essential (primary) hypertension: Secondary | ICD-10-CM | POA: Diagnosis not present

## 2016-07-19 DIAGNOSIS — R5383 Other fatigue: Secondary | ICD-10-CM | POA: Diagnosis not present

## 2016-07-19 DIAGNOSIS — M1990 Unspecified osteoarthritis, unspecified site: Secondary | ICD-10-CM | POA: Diagnosis not present

## 2016-07-19 DIAGNOSIS — G309 Alzheimer's disease, unspecified: Secondary | ICD-10-CM | POA: Diagnosis not present

## 2016-07-20 DIAGNOSIS — J189 Pneumonia, unspecified organism: Secondary | ICD-10-CM | POA: Diagnosis not present

## 2016-07-20 DIAGNOSIS — F0281 Dementia in other diseases classified elsewhere with behavioral disturbance: Secondary | ICD-10-CM | POA: Diagnosis not present

## 2016-07-20 DIAGNOSIS — I10 Essential (primary) hypertension: Secondary | ICD-10-CM | POA: Diagnosis not present

## 2016-07-20 DIAGNOSIS — G309 Alzheimer's disease, unspecified: Secondary | ICD-10-CM | POA: Diagnosis not present

## 2016-07-20 DIAGNOSIS — R630 Anorexia: Secondary | ICD-10-CM | POA: Diagnosis not present

## 2016-07-20 DIAGNOSIS — I25119 Atherosclerotic heart disease of native coronary artery with unspecified angina pectoris: Secondary | ICD-10-CM | POA: Diagnosis not present

## 2016-07-21 DIAGNOSIS — G309 Alzheimer's disease, unspecified: Secondary | ICD-10-CM | POA: Diagnosis not present

## 2016-07-21 DIAGNOSIS — J189 Pneumonia, unspecified organism: Secondary | ICD-10-CM | POA: Diagnosis not present

## 2016-07-21 DIAGNOSIS — I25119 Atherosclerotic heart disease of native coronary artery with unspecified angina pectoris: Secondary | ICD-10-CM | POA: Diagnosis not present

## 2016-07-21 DIAGNOSIS — R630 Anorexia: Secondary | ICD-10-CM | POA: Diagnosis not present

## 2016-07-21 DIAGNOSIS — I10 Essential (primary) hypertension: Secondary | ICD-10-CM | POA: Diagnosis not present

## 2016-07-21 DIAGNOSIS — F0281 Dementia in other diseases classified elsewhere with behavioral disturbance: Secondary | ICD-10-CM | POA: Diagnosis not present

## 2016-07-23 DIAGNOSIS — F0281 Dementia in other diseases classified elsewhere with behavioral disturbance: Secondary | ICD-10-CM | POA: Diagnosis not present

## 2016-07-23 DIAGNOSIS — J189 Pneumonia, unspecified organism: Secondary | ICD-10-CM | POA: Diagnosis not present

## 2016-07-23 DIAGNOSIS — I25119 Atherosclerotic heart disease of native coronary artery with unspecified angina pectoris: Secondary | ICD-10-CM | POA: Diagnosis not present

## 2016-07-23 DIAGNOSIS — G309 Alzheimer's disease, unspecified: Secondary | ICD-10-CM | POA: Diagnosis not present

## 2016-07-23 DIAGNOSIS — R630 Anorexia: Secondary | ICD-10-CM | POA: Diagnosis not present

## 2016-07-23 DIAGNOSIS — I10 Essential (primary) hypertension: Secondary | ICD-10-CM | POA: Diagnosis not present

## 2016-07-25 DIAGNOSIS — J189 Pneumonia, unspecified organism: Secondary | ICD-10-CM | POA: Diagnosis not present

## 2016-07-25 DIAGNOSIS — F0281 Dementia in other diseases classified elsewhere with behavioral disturbance: Secondary | ICD-10-CM | POA: Diagnosis not present

## 2016-07-25 DIAGNOSIS — G309 Alzheimer's disease, unspecified: Secondary | ICD-10-CM | POA: Diagnosis not present

## 2016-07-25 DIAGNOSIS — I10 Essential (primary) hypertension: Secondary | ICD-10-CM | POA: Diagnosis not present

## 2016-07-25 DIAGNOSIS — I25119 Atherosclerotic heart disease of native coronary artery with unspecified angina pectoris: Secondary | ICD-10-CM | POA: Diagnosis not present

## 2016-07-25 DIAGNOSIS — R630 Anorexia: Secondary | ICD-10-CM | POA: Diagnosis not present

## 2016-07-26 DIAGNOSIS — G309 Alzheimer's disease, unspecified: Secondary | ICD-10-CM | POA: Diagnosis not present

## 2016-07-26 DIAGNOSIS — I10 Essential (primary) hypertension: Secondary | ICD-10-CM | POA: Diagnosis not present

## 2016-07-26 DIAGNOSIS — F0281 Dementia in other diseases classified elsewhere with behavioral disturbance: Secondary | ICD-10-CM | POA: Diagnosis not present

## 2016-07-26 DIAGNOSIS — R630 Anorexia: Secondary | ICD-10-CM | POA: Diagnosis not present

## 2016-07-26 DIAGNOSIS — J189 Pneumonia, unspecified organism: Secondary | ICD-10-CM | POA: Diagnosis not present

## 2016-07-26 DIAGNOSIS — I25119 Atherosclerotic heart disease of native coronary artery with unspecified angina pectoris: Secondary | ICD-10-CM | POA: Diagnosis not present

## 2016-07-30 DIAGNOSIS — J189 Pneumonia, unspecified organism: Secondary | ICD-10-CM | POA: Diagnosis not present

## 2016-07-30 DIAGNOSIS — G309 Alzheimer's disease, unspecified: Secondary | ICD-10-CM | POA: Diagnosis not present

## 2016-07-30 DIAGNOSIS — F0281 Dementia in other diseases classified elsewhere with behavioral disturbance: Secondary | ICD-10-CM | POA: Diagnosis not present

## 2016-07-30 DIAGNOSIS — I10 Essential (primary) hypertension: Secondary | ICD-10-CM | POA: Diagnosis not present

## 2016-07-30 DIAGNOSIS — I25119 Atherosclerotic heart disease of native coronary artery with unspecified angina pectoris: Secondary | ICD-10-CM | POA: Diagnosis not present

## 2016-07-30 DIAGNOSIS — R630 Anorexia: Secondary | ICD-10-CM | POA: Diagnosis not present

## 2016-08-01 DIAGNOSIS — I10 Essential (primary) hypertension: Secondary | ICD-10-CM | POA: Diagnosis not present

## 2016-08-01 DIAGNOSIS — G309 Alzheimer's disease, unspecified: Secondary | ICD-10-CM | POA: Diagnosis not present

## 2016-08-01 DIAGNOSIS — I25119 Atherosclerotic heart disease of native coronary artery with unspecified angina pectoris: Secondary | ICD-10-CM | POA: Diagnosis not present

## 2016-08-01 DIAGNOSIS — F0281 Dementia in other diseases classified elsewhere with behavioral disturbance: Secondary | ICD-10-CM | POA: Diagnosis not present

## 2016-08-01 DIAGNOSIS — R630 Anorexia: Secondary | ICD-10-CM | POA: Diagnosis not present

## 2016-08-01 DIAGNOSIS — J189 Pneumonia, unspecified organism: Secondary | ICD-10-CM | POA: Diagnosis not present

## 2016-08-02 DIAGNOSIS — R630 Anorexia: Secondary | ICD-10-CM | POA: Diagnosis not present

## 2016-08-02 DIAGNOSIS — F0281 Dementia in other diseases classified elsewhere with behavioral disturbance: Secondary | ICD-10-CM | POA: Diagnosis not present

## 2016-08-02 DIAGNOSIS — J189 Pneumonia, unspecified organism: Secondary | ICD-10-CM | POA: Diagnosis not present

## 2016-08-02 DIAGNOSIS — I25119 Atherosclerotic heart disease of native coronary artery with unspecified angina pectoris: Secondary | ICD-10-CM | POA: Diagnosis not present

## 2016-08-02 DIAGNOSIS — G309 Alzheimer's disease, unspecified: Secondary | ICD-10-CM | POA: Diagnosis not present

## 2016-08-02 DIAGNOSIS — I10 Essential (primary) hypertension: Secondary | ICD-10-CM | POA: Diagnosis not present

## 2016-08-08 DIAGNOSIS — I25119 Atherosclerotic heart disease of native coronary artery with unspecified angina pectoris: Secondary | ICD-10-CM | POA: Diagnosis not present

## 2016-08-08 DIAGNOSIS — J189 Pneumonia, unspecified organism: Secondary | ICD-10-CM | POA: Diagnosis not present

## 2016-08-08 DIAGNOSIS — R630 Anorexia: Secondary | ICD-10-CM | POA: Diagnosis not present

## 2016-08-08 DIAGNOSIS — F0281 Dementia in other diseases classified elsewhere with behavioral disturbance: Secondary | ICD-10-CM | POA: Diagnosis not present

## 2016-08-08 DIAGNOSIS — I10 Essential (primary) hypertension: Secondary | ICD-10-CM | POA: Diagnosis not present

## 2016-08-08 DIAGNOSIS — G309 Alzheimer's disease, unspecified: Secondary | ICD-10-CM | POA: Diagnosis not present

## 2016-08-09 DIAGNOSIS — G4700 Insomnia, unspecified: Secondary | ICD-10-CM | POA: Diagnosis not present

## 2016-08-09 DIAGNOSIS — Z79899 Other long term (current) drug therapy: Secondary | ICD-10-CM | POA: Diagnosis not present

## 2016-08-09 DIAGNOSIS — I10 Essential (primary) hypertension: Secondary | ICD-10-CM | POA: Diagnosis not present

## 2016-08-09 DIAGNOSIS — M1990 Unspecified osteoarthritis, unspecified site: Secondary | ICD-10-CM | POA: Diagnosis not present

## 2016-08-09 DIAGNOSIS — R630 Anorexia: Secondary | ICD-10-CM | POA: Diagnosis not present

## 2016-08-09 DIAGNOSIS — J189 Pneumonia, unspecified organism: Secondary | ICD-10-CM | POA: Diagnosis not present

## 2016-08-09 DIAGNOSIS — F419 Anxiety disorder, unspecified: Secondary | ICD-10-CM | POA: Diagnosis not present

## 2016-08-09 DIAGNOSIS — F0281 Dementia in other diseases classified elsewhere with behavioral disturbance: Secondary | ICD-10-CM | POA: Diagnosis not present

## 2016-08-09 DIAGNOSIS — K5900 Constipation, unspecified: Secondary | ICD-10-CM | POA: Diagnosis not present

## 2016-08-09 DIAGNOSIS — I25119 Atherosclerotic heart disease of native coronary artery with unspecified angina pectoris: Secondary | ICD-10-CM | POA: Diagnosis not present

## 2016-08-09 DIAGNOSIS — R5383 Other fatigue: Secondary | ICD-10-CM | POA: Diagnosis not present

## 2016-08-09 DIAGNOSIS — G309 Alzheimer's disease, unspecified: Secondary | ICD-10-CM | POA: Diagnosis not present

## 2016-08-14 DIAGNOSIS — I25119 Atherosclerotic heart disease of native coronary artery with unspecified angina pectoris: Secondary | ICD-10-CM | POA: Diagnosis not present

## 2016-08-14 DIAGNOSIS — G309 Alzheimer's disease, unspecified: Secondary | ICD-10-CM | POA: Diagnosis not present

## 2016-08-14 DIAGNOSIS — R630 Anorexia: Secondary | ICD-10-CM | POA: Diagnosis not present

## 2016-08-14 DIAGNOSIS — F0281 Dementia in other diseases classified elsewhere with behavioral disturbance: Secondary | ICD-10-CM | POA: Diagnosis not present

## 2016-08-14 DIAGNOSIS — I10 Essential (primary) hypertension: Secondary | ICD-10-CM | POA: Diagnosis not present

## 2016-08-14 DIAGNOSIS — J189 Pneumonia, unspecified organism: Secondary | ICD-10-CM | POA: Diagnosis not present

## 2016-08-15 DIAGNOSIS — Z79899 Other long term (current) drug therapy: Secondary | ICD-10-CM | POA: Diagnosis not present

## 2016-08-16 DIAGNOSIS — F339 Major depressive disorder, recurrent, unspecified: Secondary | ICD-10-CM | POA: Diagnosis not present

## 2016-08-16 DIAGNOSIS — N183 Chronic kidney disease, stage 3 (moderate): Secondary | ICD-10-CM | POA: Diagnosis not present

## 2016-08-16 DIAGNOSIS — N401 Enlarged prostate with lower urinary tract symptoms: Secondary | ICD-10-CM | POA: Diagnosis not present

## 2016-08-16 DIAGNOSIS — F0281 Dementia in other diseases classified elsewhere with behavioral disturbance: Secondary | ICD-10-CM | POA: Diagnosis not present

## 2016-08-16 DIAGNOSIS — I25119 Atherosclerotic heart disease of native coronary artery with unspecified angina pectoris: Secondary | ICD-10-CM | POA: Diagnosis not present

## 2016-08-16 DIAGNOSIS — E039 Hypothyroidism, unspecified: Secondary | ICD-10-CM | POA: Diagnosis not present

## 2016-08-16 DIAGNOSIS — K219 Gastro-esophageal reflux disease without esophagitis: Secondary | ICD-10-CM | POA: Diagnosis not present

## 2016-08-16 DIAGNOSIS — E785 Hyperlipidemia, unspecified: Secondary | ICD-10-CM | POA: Diagnosis not present

## 2016-08-16 DIAGNOSIS — H409 Unspecified glaucoma: Secondary | ICD-10-CM | POA: Diagnosis not present

## 2016-08-16 DIAGNOSIS — G309 Alzheimer's disease, unspecified: Secondary | ICD-10-CM | POA: Diagnosis not present

## 2016-08-16 DIAGNOSIS — I5030 Unspecified diastolic (congestive) heart failure: Secondary | ICD-10-CM | POA: Diagnosis not present

## 2016-08-16 DIAGNOSIS — I739 Peripheral vascular disease, unspecified: Secondary | ICD-10-CM | POA: Diagnosis not present

## 2016-08-16 DIAGNOSIS — I10 Essential (primary) hypertension: Secondary | ICD-10-CM | POA: Diagnosis not present

## 2016-08-19 DIAGNOSIS — N183 Chronic kidney disease, stage 3 (moderate): Secondary | ICD-10-CM | POA: Diagnosis not present

## 2016-08-19 DIAGNOSIS — I739 Peripheral vascular disease, unspecified: Secondary | ICD-10-CM | POA: Diagnosis not present

## 2016-08-19 DIAGNOSIS — G309 Alzheimer's disease, unspecified: Secondary | ICD-10-CM | POA: Diagnosis not present

## 2016-08-19 DIAGNOSIS — I10 Essential (primary) hypertension: Secondary | ICD-10-CM | POA: Diagnosis not present

## 2016-08-19 DIAGNOSIS — I25119 Atherosclerotic heart disease of native coronary artery with unspecified angina pectoris: Secondary | ICD-10-CM | POA: Diagnosis not present

## 2016-08-19 DIAGNOSIS — F0281 Dementia in other diseases classified elsewhere with behavioral disturbance: Secondary | ICD-10-CM | POA: Diagnosis not present

## 2016-08-20 DIAGNOSIS — I10 Essential (primary) hypertension: Secondary | ICD-10-CM | POA: Diagnosis not present

## 2016-08-20 DIAGNOSIS — G309 Alzheimer's disease, unspecified: Secondary | ICD-10-CM | POA: Diagnosis not present

## 2016-08-20 DIAGNOSIS — I739 Peripheral vascular disease, unspecified: Secondary | ICD-10-CM | POA: Diagnosis not present

## 2016-08-20 DIAGNOSIS — I25119 Atherosclerotic heart disease of native coronary artery with unspecified angina pectoris: Secondary | ICD-10-CM | POA: Diagnosis not present

## 2016-08-20 DIAGNOSIS — F0281 Dementia in other diseases classified elsewhere with behavioral disturbance: Secondary | ICD-10-CM | POA: Diagnosis not present

## 2016-08-20 DIAGNOSIS — N183 Chronic kidney disease, stage 3 (moderate): Secondary | ICD-10-CM | POA: Diagnosis not present

## 2016-08-22 DIAGNOSIS — I739 Peripheral vascular disease, unspecified: Secondary | ICD-10-CM | POA: Diagnosis not present

## 2016-08-22 DIAGNOSIS — I25119 Atherosclerotic heart disease of native coronary artery with unspecified angina pectoris: Secondary | ICD-10-CM | POA: Diagnosis not present

## 2016-08-22 DIAGNOSIS — F0281 Dementia in other diseases classified elsewhere with behavioral disturbance: Secondary | ICD-10-CM | POA: Diagnosis not present

## 2016-08-22 DIAGNOSIS — I10 Essential (primary) hypertension: Secondary | ICD-10-CM | POA: Diagnosis not present

## 2016-08-22 DIAGNOSIS — G309 Alzheimer's disease, unspecified: Secondary | ICD-10-CM | POA: Diagnosis not present

## 2016-08-22 DIAGNOSIS — N183 Chronic kidney disease, stage 3 (moderate): Secondary | ICD-10-CM | POA: Diagnosis not present

## 2016-08-23 DIAGNOSIS — I10 Essential (primary) hypertension: Secondary | ICD-10-CM | POA: Diagnosis not present

## 2016-08-23 DIAGNOSIS — G309 Alzheimer's disease, unspecified: Secondary | ICD-10-CM | POA: Diagnosis not present

## 2016-08-23 DIAGNOSIS — I25119 Atherosclerotic heart disease of native coronary artery with unspecified angina pectoris: Secondary | ICD-10-CM | POA: Diagnosis not present

## 2016-08-23 DIAGNOSIS — I739 Peripheral vascular disease, unspecified: Secondary | ICD-10-CM | POA: Diagnosis not present

## 2016-08-23 DIAGNOSIS — F0281 Dementia in other diseases classified elsewhere with behavioral disturbance: Secondary | ICD-10-CM | POA: Diagnosis not present

## 2016-08-23 DIAGNOSIS — N183 Chronic kidney disease, stage 3 (moderate): Secondary | ICD-10-CM | POA: Diagnosis not present

## 2016-08-24 DIAGNOSIS — F0281 Dementia in other diseases classified elsewhere with behavioral disturbance: Secondary | ICD-10-CM | POA: Diagnosis not present

## 2016-08-24 DIAGNOSIS — I739 Peripheral vascular disease, unspecified: Secondary | ICD-10-CM | POA: Diagnosis not present

## 2016-08-24 DIAGNOSIS — N183 Chronic kidney disease, stage 3 (moderate): Secondary | ICD-10-CM | POA: Diagnosis not present

## 2016-08-24 DIAGNOSIS — G309 Alzheimer's disease, unspecified: Secondary | ICD-10-CM | POA: Diagnosis not present

## 2016-08-24 DIAGNOSIS — I25119 Atherosclerotic heart disease of native coronary artery with unspecified angina pectoris: Secondary | ICD-10-CM | POA: Diagnosis not present

## 2016-08-24 DIAGNOSIS — I10 Essential (primary) hypertension: Secondary | ICD-10-CM | POA: Diagnosis not present

## 2016-08-29 DIAGNOSIS — I25119 Atherosclerotic heart disease of native coronary artery with unspecified angina pectoris: Secondary | ICD-10-CM | POA: Diagnosis not present

## 2016-08-29 DIAGNOSIS — G309 Alzheimer's disease, unspecified: Secondary | ICD-10-CM | POA: Diagnosis not present

## 2016-08-29 DIAGNOSIS — F0281 Dementia in other diseases classified elsewhere with behavioral disturbance: Secondary | ICD-10-CM | POA: Diagnosis not present

## 2016-08-29 DIAGNOSIS — I739 Peripheral vascular disease, unspecified: Secondary | ICD-10-CM | POA: Diagnosis not present

## 2016-08-29 DIAGNOSIS — N183 Chronic kidney disease, stage 3 (moderate): Secondary | ICD-10-CM | POA: Diagnosis not present

## 2016-08-29 DIAGNOSIS — I10 Essential (primary) hypertension: Secondary | ICD-10-CM | POA: Diagnosis not present

## 2016-08-30 DIAGNOSIS — G309 Alzheimer's disease, unspecified: Secondary | ICD-10-CM | POA: Diagnosis not present

## 2016-08-30 DIAGNOSIS — F0281 Dementia in other diseases classified elsewhere with behavioral disturbance: Secondary | ICD-10-CM | POA: Diagnosis not present

## 2016-08-30 DIAGNOSIS — I25119 Atherosclerotic heart disease of native coronary artery with unspecified angina pectoris: Secondary | ICD-10-CM | POA: Diagnosis not present

## 2016-08-30 DIAGNOSIS — I10 Essential (primary) hypertension: Secondary | ICD-10-CM | POA: Diagnosis not present

## 2016-08-30 DIAGNOSIS — I739 Peripheral vascular disease, unspecified: Secondary | ICD-10-CM | POA: Diagnosis not present

## 2016-08-30 DIAGNOSIS — N183 Chronic kidney disease, stage 3 (moderate): Secondary | ICD-10-CM | POA: Diagnosis not present

## 2016-08-31 ENCOUNTER — Other Ambulatory Visit (HOSPITAL_COMMUNITY): Payer: Medicare Other

## 2016-08-31 ENCOUNTER — Ambulatory Visit: Payer: Medicare Other | Admitting: Family

## 2016-08-31 ENCOUNTER — Encounter (HOSPITAL_COMMUNITY): Payer: Medicare Other

## 2016-09-04 DIAGNOSIS — G8929 Other chronic pain: Secondary | ICD-10-CM | POA: Diagnosis not present

## 2016-09-04 DIAGNOSIS — G309 Alzheimer's disease, unspecified: Secondary | ICD-10-CM | POA: Diagnosis not present

## 2016-09-04 DIAGNOSIS — I739 Peripheral vascular disease, unspecified: Secondary | ICD-10-CM | POA: Diagnosis not present

## 2016-09-04 DIAGNOSIS — N183 Chronic kidney disease, stage 3 (moderate): Secondary | ICD-10-CM | POA: Diagnosis not present

## 2016-09-04 DIAGNOSIS — K5900 Constipation, unspecified: Secondary | ICD-10-CM | POA: Diagnosis not present

## 2016-09-04 DIAGNOSIS — R05 Cough: Secondary | ICD-10-CM | POA: Diagnosis not present

## 2016-09-04 DIAGNOSIS — R54 Age-related physical debility: Secondary | ICD-10-CM | POA: Diagnosis not present

## 2016-09-04 DIAGNOSIS — F0281 Dementia in other diseases classified elsewhere with behavioral disturbance: Secondary | ICD-10-CM | POA: Diagnosis not present

## 2016-09-04 DIAGNOSIS — I10 Essential (primary) hypertension: Secondary | ICD-10-CM | POA: Diagnosis not present

## 2016-09-04 DIAGNOSIS — J206 Acute bronchitis due to rhinovirus: Secondary | ICD-10-CM | POA: Diagnosis not present

## 2016-09-04 DIAGNOSIS — Z7409 Other reduced mobility: Secondary | ICD-10-CM | POA: Diagnosis not present

## 2016-09-04 DIAGNOSIS — I25119 Atherosclerotic heart disease of native coronary artery with unspecified angina pectoris: Secondary | ICD-10-CM | POA: Diagnosis not present

## 2016-09-05 DIAGNOSIS — I25119 Atherosclerotic heart disease of native coronary artery with unspecified angina pectoris: Secondary | ICD-10-CM | POA: Diagnosis not present

## 2016-09-05 DIAGNOSIS — N183 Chronic kidney disease, stage 3 (moderate): Secondary | ICD-10-CM | POA: Diagnosis not present

## 2016-09-05 DIAGNOSIS — G309 Alzheimer's disease, unspecified: Secondary | ICD-10-CM | POA: Diagnosis not present

## 2016-09-05 DIAGNOSIS — I739 Peripheral vascular disease, unspecified: Secondary | ICD-10-CM | POA: Diagnosis not present

## 2016-09-05 DIAGNOSIS — I10 Essential (primary) hypertension: Secondary | ICD-10-CM | POA: Diagnosis not present

## 2016-09-05 DIAGNOSIS — F0281 Dementia in other diseases classified elsewhere with behavioral disturbance: Secondary | ICD-10-CM | POA: Diagnosis not present

## 2016-09-06 DIAGNOSIS — G309 Alzheimer's disease, unspecified: Secondary | ICD-10-CM | POA: Diagnosis not present

## 2016-09-06 DIAGNOSIS — N183 Chronic kidney disease, stage 3 (moderate): Secondary | ICD-10-CM | POA: Diagnosis not present

## 2016-09-06 DIAGNOSIS — F0281 Dementia in other diseases classified elsewhere with behavioral disturbance: Secondary | ICD-10-CM | POA: Diagnosis not present

## 2016-09-06 DIAGNOSIS — I25119 Atherosclerotic heart disease of native coronary artery with unspecified angina pectoris: Secondary | ICD-10-CM | POA: Diagnosis not present

## 2016-09-06 DIAGNOSIS — I10 Essential (primary) hypertension: Secondary | ICD-10-CM | POA: Diagnosis not present

## 2016-09-06 DIAGNOSIS — I739 Peripheral vascular disease, unspecified: Secondary | ICD-10-CM | POA: Diagnosis not present

## 2016-09-08 DIAGNOSIS — I739 Peripheral vascular disease, unspecified: Secondary | ICD-10-CM | POA: Diagnosis not present

## 2016-09-08 DIAGNOSIS — I10 Essential (primary) hypertension: Secondary | ICD-10-CM | POA: Diagnosis not present

## 2016-09-08 DIAGNOSIS — G309 Alzheimer's disease, unspecified: Secondary | ICD-10-CM | POA: Diagnosis not present

## 2016-09-08 DIAGNOSIS — I25119 Atherosclerotic heart disease of native coronary artery with unspecified angina pectoris: Secondary | ICD-10-CM | POA: Diagnosis not present

## 2016-09-08 DIAGNOSIS — F0281 Dementia in other diseases classified elsewhere with behavioral disturbance: Secondary | ICD-10-CM | POA: Diagnosis not present

## 2016-09-08 DIAGNOSIS — N183 Chronic kidney disease, stage 3 (moderate): Secondary | ICD-10-CM | POA: Diagnosis not present

## 2016-09-12 DIAGNOSIS — F0281 Dementia in other diseases classified elsewhere with behavioral disturbance: Secondary | ICD-10-CM | POA: Diagnosis not present

## 2016-09-12 DIAGNOSIS — I10 Essential (primary) hypertension: Secondary | ICD-10-CM | POA: Diagnosis not present

## 2016-09-12 DIAGNOSIS — G309 Alzheimer's disease, unspecified: Secondary | ICD-10-CM | POA: Diagnosis not present

## 2016-09-12 DIAGNOSIS — I25119 Atherosclerotic heart disease of native coronary artery with unspecified angina pectoris: Secondary | ICD-10-CM | POA: Diagnosis not present

## 2016-09-12 DIAGNOSIS — I739 Peripheral vascular disease, unspecified: Secondary | ICD-10-CM | POA: Diagnosis not present

## 2016-09-12 DIAGNOSIS — N183 Chronic kidney disease, stage 3 (moderate): Secondary | ICD-10-CM | POA: Diagnosis not present

## 2016-09-13 DIAGNOSIS — I739 Peripheral vascular disease, unspecified: Secondary | ICD-10-CM | POA: Diagnosis not present

## 2016-09-13 DIAGNOSIS — N183 Chronic kidney disease, stage 3 (moderate): Secondary | ICD-10-CM | POA: Diagnosis not present

## 2016-09-13 DIAGNOSIS — I10 Essential (primary) hypertension: Secondary | ICD-10-CM | POA: Diagnosis not present

## 2016-09-13 DIAGNOSIS — I25119 Atherosclerotic heart disease of native coronary artery with unspecified angina pectoris: Secondary | ICD-10-CM | POA: Diagnosis not present

## 2016-09-13 DIAGNOSIS — G309 Alzheimer's disease, unspecified: Secondary | ICD-10-CM | POA: Diagnosis not present

## 2016-09-13 DIAGNOSIS — F0281 Dementia in other diseases classified elsewhere with behavioral disturbance: Secondary | ICD-10-CM | POA: Diagnosis not present

## 2016-09-16 DIAGNOSIS — E039 Hypothyroidism, unspecified: Secondary | ICD-10-CM | POA: Diagnosis not present

## 2016-09-16 DIAGNOSIS — F339 Major depressive disorder, recurrent, unspecified: Secondary | ICD-10-CM | POA: Diagnosis not present

## 2016-09-16 DIAGNOSIS — I5030 Unspecified diastolic (congestive) heart failure: Secondary | ICD-10-CM | POA: Diagnosis not present

## 2016-09-16 DIAGNOSIS — E785 Hyperlipidemia, unspecified: Secondary | ICD-10-CM | POA: Diagnosis not present

## 2016-09-16 DIAGNOSIS — I25119 Atherosclerotic heart disease of native coronary artery with unspecified angina pectoris: Secondary | ICD-10-CM | POA: Diagnosis not present

## 2016-09-16 DIAGNOSIS — G309 Alzheimer's disease, unspecified: Secondary | ICD-10-CM | POA: Diagnosis not present

## 2016-09-16 DIAGNOSIS — F0281 Dementia in other diseases classified elsewhere with behavioral disturbance: Secondary | ICD-10-CM | POA: Diagnosis not present

## 2016-09-16 DIAGNOSIS — K219 Gastro-esophageal reflux disease without esophagitis: Secondary | ICD-10-CM | POA: Diagnosis not present

## 2016-09-16 DIAGNOSIS — I10 Essential (primary) hypertension: Secondary | ICD-10-CM | POA: Diagnosis not present

## 2016-09-16 DIAGNOSIS — N401 Enlarged prostate with lower urinary tract symptoms: Secondary | ICD-10-CM | POA: Diagnosis not present

## 2016-09-16 DIAGNOSIS — H409 Unspecified glaucoma: Secondary | ICD-10-CM | POA: Diagnosis not present

## 2016-09-16 DIAGNOSIS — I739 Peripheral vascular disease, unspecified: Secondary | ICD-10-CM | POA: Diagnosis not present

## 2016-09-16 DIAGNOSIS — N183 Chronic kidney disease, stage 3 (moderate): Secondary | ICD-10-CM | POA: Diagnosis not present

## 2016-09-19 DIAGNOSIS — F0391 Unspecified dementia with behavioral disturbance: Secondary | ICD-10-CM | POA: Diagnosis not present

## 2016-09-19 DIAGNOSIS — F39 Unspecified mood [affective] disorder: Secondary | ICD-10-CM | POA: Diagnosis not present

## 2016-09-20 DIAGNOSIS — F0281 Dementia in other diseases classified elsewhere with behavioral disturbance: Secondary | ICD-10-CM | POA: Diagnosis not present

## 2016-09-20 DIAGNOSIS — I10 Essential (primary) hypertension: Secondary | ICD-10-CM | POA: Diagnosis not present

## 2016-09-20 DIAGNOSIS — F419 Anxiety disorder, unspecified: Secondary | ICD-10-CM | POA: Diagnosis not present

## 2016-09-20 DIAGNOSIS — G4709 Other insomnia: Secondary | ICD-10-CM | POA: Diagnosis not present

## 2016-09-20 DIAGNOSIS — G308 Other Alzheimer's disease: Secondary | ICD-10-CM | POA: Diagnosis not present

## 2016-09-20 DIAGNOSIS — R451 Restlessness and agitation: Secondary | ICD-10-CM | POA: Diagnosis not present

## 2016-09-20 DIAGNOSIS — Z79899 Other long term (current) drug therapy: Secondary | ICD-10-CM | POA: Diagnosis not present

## 2016-10-06 DIAGNOSIS — R109 Unspecified abdominal pain: Secondary | ICD-10-CM | POA: Diagnosis not present

## 2016-10-06 DIAGNOSIS — R1900 Intra-abdominal and pelvic swelling, mass and lump, unspecified site: Secondary | ICD-10-CM | POA: Diagnosis not present

## 2016-10-09 DIAGNOSIS — K5900 Constipation, unspecified: Secondary | ICD-10-CM | POA: Diagnosis not present

## 2016-10-09 DIAGNOSIS — I2510 Atherosclerotic heart disease of native coronary artery without angina pectoris: Secondary | ICD-10-CM | POA: Diagnosis not present

## 2016-10-09 DIAGNOSIS — F0281 Dementia in other diseases classified elsewhere with behavioral disturbance: Secondary | ICD-10-CM | POA: Diagnosis not present

## 2016-10-11 ENCOUNTER — Emergency Department (HOSPITAL_COMMUNITY)

## 2016-10-11 ENCOUNTER — Other Ambulatory Visit: Payer: Self-pay | Admitting: Physician Assistant

## 2016-10-11 ENCOUNTER — Inpatient Hospital Stay (HOSPITAL_COMMUNITY)
Admission: EM | Admit: 2016-10-11 | Discharge: 2016-11-16 | DRG: 682 | Disposition: E | Attending: Internal Medicine | Admitting: Internal Medicine

## 2016-10-11 ENCOUNTER — Ambulatory Visit
Admission: RE | Admit: 2016-10-11 | Discharge: 2016-10-11 | Disposition: A | Discharge status: Home / Self Care | Payer: Medicare Other | Source: Ambulatory Visit | Attending: Physician Assistant | Admitting: Physician Assistant

## 2016-10-11 DIAGNOSIS — R100 Acute abdomen: Secondary | ICD-10-CM

## 2016-10-11 DIAGNOSIS — I10 Essential (primary) hypertension: Secondary | ICD-10-CM | POA: Diagnosis not present

## 2016-10-11 DIAGNOSIS — J690 Pneumonitis due to inhalation of food and vomit: Secondary | ICD-10-CM | POA: Diagnosis present

## 2016-10-11 DIAGNOSIS — Z823 Family history of stroke: Secondary | ICD-10-CM

## 2016-10-11 DIAGNOSIS — G9341 Metabolic encephalopathy: Secondary | ICD-10-CM | POA: Diagnosis present

## 2016-10-11 DIAGNOSIS — I714 Abdominal aortic aneurysm, without rupture, unspecified: Secondary | ICD-10-CM | POA: Diagnosis present

## 2016-10-11 DIAGNOSIS — Z87891 Personal history of nicotine dependence: Secondary | ICD-10-CM

## 2016-10-11 DIAGNOSIS — R451 Restlessness and agitation: Secondary | ICD-10-CM | POA: Diagnosis not present

## 2016-10-11 DIAGNOSIS — Z7401 Bed confinement status: Secondary | ICD-10-CM

## 2016-10-11 DIAGNOSIS — F01518 Vascular dementia, unspecified severity, with other behavioral disturbance: Secondary | ICD-10-CM | POA: Diagnosis present

## 2016-10-11 DIAGNOSIS — K5900 Constipation, unspecified: Secondary | ICD-10-CM | POA: Diagnosis present

## 2016-10-11 DIAGNOSIS — Z87442 Personal history of urinary calculi: Secondary | ICD-10-CM

## 2016-10-11 DIAGNOSIS — N401 Enlarged prostate with lower urinary tract symptoms: Secondary | ICD-10-CM | POA: Diagnosis present

## 2016-10-11 DIAGNOSIS — M545 Low back pain: Secondary | ICD-10-CM | POA: Diagnosis present

## 2016-10-11 DIAGNOSIS — I129 Hypertensive chronic kidney disease with stage 1 through stage 4 chronic kidney disease, or unspecified chronic kidney disease: Secondary | ICD-10-CM | POA: Diagnosis present

## 2016-10-11 DIAGNOSIS — Z9049 Acquired absence of other specified parts of digestive tract: Secondary | ICD-10-CM

## 2016-10-11 DIAGNOSIS — N179 Acute kidney failure, unspecified: Secondary | ICD-10-CM | POA: Diagnosis not present

## 2016-10-11 DIAGNOSIS — E875 Hyperkalemia: Secondary | ICD-10-CM | POA: Diagnosis present

## 2016-10-11 DIAGNOSIS — R1084 Generalized abdominal pain: Secondary | ICD-10-CM

## 2016-10-11 DIAGNOSIS — E039 Hypothyroidism, unspecified: Secondary | ICD-10-CM | POA: Diagnosis present

## 2016-10-11 DIAGNOSIS — E785 Hyperlipidemia, unspecified: Secondary | ICD-10-CM | POA: Diagnosis present

## 2016-10-11 DIAGNOSIS — F329 Major depressive disorder, single episode, unspecified: Secondary | ICD-10-CM | POA: Diagnosis present

## 2016-10-11 DIAGNOSIS — R627 Adult failure to thrive: Secondary | ICD-10-CM | POA: Diagnosis present

## 2016-10-11 DIAGNOSIS — Z881 Allergy status to other antibiotic agents status: Secondary | ICD-10-CM

## 2016-10-11 DIAGNOSIS — H9190 Unspecified hearing loss, unspecified ear: Secondary | ICD-10-CM | POA: Diagnosis present

## 2016-10-11 DIAGNOSIS — E860 Dehydration: Secondary | ICD-10-CM | POA: Diagnosis present

## 2016-10-11 DIAGNOSIS — F0151 Vascular dementia with behavioral disturbance: Secondary | ICD-10-CM | POA: Diagnosis present

## 2016-10-11 DIAGNOSIS — M159 Polyosteoarthritis, unspecified: Secondary | ICD-10-CM | POA: Diagnosis present

## 2016-10-11 DIAGNOSIS — R109 Unspecified abdominal pain: Secondary | ICD-10-CM

## 2016-10-11 DIAGNOSIS — I2510 Atherosclerotic heart disease of native coronary artery without angina pectoris: Secondary | ICD-10-CM | POA: Diagnosis present

## 2016-10-11 DIAGNOSIS — Z66 Do not resuscitate: Secondary | ICD-10-CM | POA: Diagnosis present

## 2016-10-11 DIAGNOSIS — K219 Gastro-esophageal reflux disease without esophagitis: Secondary | ICD-10-CM | POA: Diagnosis present

## 2016-10-11 DIAGNOSIS — I739 Peripheral vascular disease, unspecified: Secondary | ICD-10-CM | POA: Diagnosis present

## 2016-10-11 DIAGNOSIS — N138 Other obstructive and reflux uropathy: Secondary | ICD-10-CM | POA: Diagnosis present

## 2016-10-11 DIAGNOSIS — Z6824 Body mass index (BMI) 24.0-24.9, adult: Secondary | ICD-10-CM

## 2016-10-11 DIAGNOSIS — Z85819 Personal history of malignant neoplasm of unspecified site of lip, oral cavity, and pharynx: Secondary | ICD-10-CM

## 2016-10-11 DIAGNOSIS — G4709 Other insomnia: Secondary | ICD-10-CM | POA: Diagnosis not present

## 2016-10-11 DIAGNOSIS — Z951 Presence of aortocoronary bypass graft: Secondary | ICD-10-CM

## 2016-10-11 DIAGNOSIS — Z885 Allergy status to narcotic agent status: Secondary | ICD-10-CM

## 2016-10-11 DIAGNOSIS — F419 Anxiety disorder, unspecified: Secondary | ICD-10-CM | POA: Diagnosis not present

## 2016-10-11 DIAGNOSIS — Z888 Allergy status to other drugs, medicaments and biological substances status: Secondary | ICD-10-CM

## 2016-10-11 DIAGNOSIS — Z8249 Family history of ischemic heart disease and other diseases of the circulatory system: Secondary | ICD-10-CM

## 2016-10-11 DIAGNOSIS — G9340 Encephalopathy, unspecified: Secondary | ICD-10-CM | POA: Diagnosis present

## 2016-10-11 DIAGNOSIS — G308 Other Alzheimer's disease: Secondary | ICD-10-CM | POA: Diagnosis not present

## 2016-10-11 DIAGNOSIS — Z515 Encounter for palliative care: Secondary | ICD-10-CM | POA: Diagnosis not present

## 2016-10-11 DIAGNOSIS — Z79899 Other long term (current) drug therapy: Secondary | ICD-10-CM | POA: Diagnosis not present

## 2016-10-11 DIAGNOSIS — N183 Chronic kidney disease, stage 3 (moderate): Secondary | ICD-10-CM | POA: Diagnosis present

## 2016-10-11 DIAGNOSIS — R609 Edema, unspecified: Secondary | ICD-10-CM | POA: Diagnosis not present

## 2016-10-11 DIAGNOSIS — F0281 Dementia in other diseases classified elsewhere with behavioral disturbance: Secondary | ICD-10-CM | POA: Diagnosis not present

## 2016-10-11 LAB — CBC WITH DIFFERENTIAL/PLATELET
Basophils Absolute: 0 10*3/uL (ref 0.0–0.1)
Basophils Relative: 0 %
Eosinophils Absolute: 0 10*3/uL (ref 0.0–0.7)
Eosinophils Relative: 0 %
HCT: 41.8 % (ref 39.0–52.0)
Hemoglobin: 14.3 g/dL (ref 13.0–17.0)
Lymphocytes Relative: 10 %
Lymphs Abs: 1.5 10*3/uL (ref 0.7–4.0)
MCH: 32.1 pg (ref 26.0–34.0)
MCHC: 34.2 g/dL (ref 30.0–36.0)
MCV: 93.7 fL (ref 78.0–100.0)
Monocytes Absolute: 0.4 10*3/uL (ref 0.1–1.0)
Monocytes Relative: 3 %
Neutro Abs: 13.4 10*3/uL — ABNORMAL HIGH (ref 1.7–7.7)
Neutrophils Relative %: 87 %
Platelets: 304 10*3/uL (ref 150–400)
RBC: 4.46 MIL/uL (ref 4.22–5.81)
RDW: 13.5 % (ref 11.5–15.5)
WBC: 15.4 10*3/uL — ABNORMAL HIGH (ref 4.0–10.5)

## 2016-10-11 LAB — COMPREHENSIVE METABOLIC PANEL
ALT: 20 U/L (ref 17–63)
AST: 20 U/L (ref 15–41)
Albumin: 2.7 g/dL — ABNORMAL LOW (ref 3.5–5.0)
Alkaline Phosphatase: 68 U/L (ref 38–126)
Anion gap: 19 — ABNORMAL HIGH (ref 5–15)
BUN: 133 mg/dL — ABNORMAL HIGH (ref 6–20)
CO2: 20 mmol/L — ABNORMAL LOW (ref 22–32)
Calcium: 8.3 mg/dL — ABNORMAL LOW (ref 8.9–10.3)
Chloride: 101 mmol/L (ref 101–111)
Creatinine, Ser: 8.72 mg/dL — ABNORMAL HIGH (ref 0.61–1.24)
GFR calc Af Amer: 6 mL/min — ABNORMAL LOW (ref >60)
GFR calc non Af Amer: 5 mL/min — ABNORMAL LOW (ref >60)
Glucose, Bld: 122 mg/dL — ABNORMAL HIGH (ref 65–99)
Potassium: 6.5 mmol/L (ref 3.5–5.1)
Sodium: 140 mmol/L (ref 135–145)
Total Bilirubin: 0.7 mg/dL (ref 0.3–1.2)
Total Protein: 6.8 g/dL (ref 6.5–8.1)

## 2016-10-11 LAB — LIPASE, BLOOD: Lipase: 15 U/L (ref 11–51)

## 2016-10-11 MED ORDER — SODIUM CHLORIDE 0.9 % IV SOLN
1.0000 g | Freq: Once | INTRAVENOUS | Status: AC
  Administered 2016-10-12: 1 g via INTRAVENOUS
  Filled 2016-10-11: qty 10

## 2016-10-11 MED ORDER — SODIUM BICARBONATE 8.4 % IV SOLN
50.0000 meq | Freq: Once | INTRAVENOUS | Status: AC
  Administered 2016-10-12: 50 meq via INTRAVENOUS
  Filled 2016-10-11: qty 50

## 2016-10-11 MED ORDER — SODIUM CHLORIDE 0.9 % IV BOLUS (SEPSIS)
1000.0000 mL | Freq: Once | INTRAVENOUS | Status: AC
  Administered 2016-10-11: 1000 mL via INTRAVENOUS

## 2016-10-11 NOTE — ED Provider Notes (Signed)
[facility] DEPT Provider Note   CSN: [id number] Arrival date & time: 10/15/2016  1832     History   Chief Complaint Chief Complaint  Patient presents with  . Abdominal Pain    HPI [name] is a 83 y.o. male with history of vascular dementia, CAD who presents with a week and a half abdominal pain and constipation. Patient is a hospice patient was given MiraLAX and 2 enemas. Patient had several large bowel movements yesterday but patient is still significantly tender. Patient's daughter who is POA presents and is speaking with the patient. She reports that he has not been talking much the past few days. He has had a significantly decreased appetite. She has also heard him cough a few times. The hospice MD started him on Cipro last week for suspected diverticulitis. He was sent to [facility] today for a CTAP, but logistics caused the scan not to be completed. The hospice facility really felt it should be done today, so he was brought here.   HPI  Past Medical History:  Diagnosis Date  . Abdominal aortic aneurysm ([place])    a. 07/2014 Abd U/S: 3.61 cm, RCIA >50d.  Marland Kitchen Aphthous ulcer   . BPH (benign prostatic hypertrophy)   . CAD (coronary artery disease)    a. s/p CABG 1993;  b. cath 10/10: severe native 3v CAD, S-Dx ok, S-OM1/OM2 ok, S-PDA ok, L-LAD ok, EF 60%.  . Carotid stenosis    a. s/p Left CEA, followed by Dr. Donnetta Hutching;  b. 07/2014 u/s: RICA < 20, LICA patent.  . Cervical spondylosis without myelopathy   . CKD (chronic kidney disease), stage III   . GERD (gastroesophageal reflux disease)   . History of nephrolithiasis   . HOH (hard of hearing)    hearing aids  . Hyperlipidemia   . Memory deficits 06/19/2013  . Pre-syncope   . Radicular low back pain   . Throat cancer ([place])   . 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 Unspecified adverse effect of unspecified drug, medicinal and biological substance     Patient Active Problem List   Diagnosis Date Noted   . HCAP (healthcare-associated pneumonia) 04/28/2016  . Acute kidney injury ([place]) 04/28/2016  . Acute respiratory failure with hypoxia ([place]) 04/28/2016  . Left ventricular diastolic dysfunction 73/41/9379  . Hypothyroidism 04/28/2016  . Abnormal urinalysis 04/28/2016  . Vascular dementia with behavior disturbance 11/15/2015  . Palliative care patient 11/15/2015  . Acute encephalopathy 09/23/2015  . Depression 10/24/2013  . CKD (chronic kidney disease), stage III 09/28/2013  . Right inguinal hernia 09/26/2013  . AAA (abdominal aortic aneurysm) without rupture ([place]) 07/22/2013  . Cervical spondylosis without myelopathy 06/19/2013  . PVD- hx CEA,patent carotids by doppler July 2015 09/26/2010  . 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 Essential hypertension 03/16/2009  . Low back pain potentially associated with radiculopathy 03/19/2008  . BPH (benign prostatic hyperplasia) 11/25/2007  . GERD 10/24/2006  . Hyperlipidemia 09/14/2006  . CAD (coronary artery disease) s/p bypass graft 09/14/2006  . NEPHROLITHIASIS, HX OF 09/14/2006    Past Surgical History:  Procedure Laterality Date  . BACK SURGERY    . CARDIAC CATHETERIZATION    . CAROTID ENDARTERECTOMY Left   . CHOLECYSTECTOMY    . CORONARY ARTERY BYPASS GRAFT    . CORONARY ARTERY BYPASS GRAFT  1998  . HERNIA REPAIR     unclear which side  . INGUINAL HERNIA REPAIR Right 09/29/2013   Procedure: HERNIA REPAIR  INGUINAL ADULT;  Surgeon: Rolm Bookbinder, MD;  Location: [facility];  Service: General;  Laterality: Right;  . INSERTION OF MESH Right 09/29/2013   Procedure: INSERTION OF MESH;  Surgeon: Rolm Bookbinder, MD;  Location: [facility];  Service: General;  Laterality: Right;  . PROSTATE SURGERY    . renal stone surgery     3 times  . THROAT SURGERY         Home Medications    Prior to Admission medications   Medication Sig Start Date End Date Taking? Authorizing Provider  acetaminophen (TYLENOL) 500 MG tablet Take 2 tablets  (1,000 mg total) by mouth every 8 (eight) hours. 09/29/15  Yes Golding, Elizabeth L, DO  ALPHAGAN P 0.1 % SOLN Place 1 drop into both eyes at bedtime.  04/24/13  Yes [provider]  ciprofloxacin (CIPRO) 500 MG tablet Take 500 mg by mouth 2 (two) times daily.   Yes [provider]  escitalopram (LEXAPRO) 5 MG/5ML solution Take 20 mLs by mouth daily. 10/04/16  Yes [provider]  feeding supplement, ENSURE ENLIVE, (ENSURE ENLIVE) LIQD Take 237 mLs by mouth 2 (two) times daily between meals. 05/01/16  Yes Dessa Phi Chahn-Yang, DO  finasteride (PROSCAR) 5 MG tablet Take 5 mg by mouth daily. 11/12/15  Yes [provider]  LORazepam (ATIVAN) 1 MG tablet Take 0.5-1 tablets (0.5-1 mg total) by mouth 2 (two) times daily as needed (agitation/aggressive behavior). Patient taking differently: Take 0.5 mg by mouth 2 (two) times daily.  11/29/15  Yes Sherwood Gambler, MD  permethrin (ELIMITE) 5 % cream Apply 1 application topically once a week. 09/26/16  Yes [provider]  polyethylene glycol (MIRALAX / GLYCOLAX) packet Take 17 g by mouth 2 (two) times daily. 09/29/15  Yes Lane Hacker L, DO  risperiDONE (RISPERDAL) 1 MG tablet Take 1 mg by mouth at bedtime.   Yes [provider]  traZODone (DESYREL) 100 MG tablet Take 100 mg by mouth at bedtime.    Yes [provider]  Valproate Sodium (VALPROIC ACID) 250 MG/5ML SOLN Take 10 mLs by mouth 2 (two) times daily.   Yes [provider]  aspirin 81 MG tablet Take 1 tablet (81 mg total) by mouth daily. Pt takes 1 tablet by mouth on Mon and Fri. Patient not taking: Reported on 10/06/2016 09/29/15   Acquanetta Chain, DO  celecoxib (CELEBREX) 100 MG capsule Take 1 capsule (100 mg total) by mouth 2 (two) times daily. Patient not taking: Reported on 10/12/2016 09/29/15   Lane Hacker L, DO  escitalopram (LEXAPRO) 20 MG tablet TAKE ONE TABLET BY MOUTH ONCE DAILY Patient not taking: Reported  on 09/22/2016 08/30/15   Marin Olp, MD  OLANZapine (ZYPREXA) 5 MG tablet Take 1 tablet (5 mg total) by mouth at bedtime. Patient not taking: Reported on 09/25/2016 09/29/15   Acquanetta Chain, DO    Family History Family History  Problem Relation Age of Onset  . Heart disease Father   . Stroke Father   . AAA (abdominal aortic aneurysm) Sister   . AAA (abdominal aortic aneurysm) Brother   . Stroke Brother   . Coronary artery disease Brother   . Heart disease Brother   . Coronary artery disease Unknown     Social History Social History  Substance Use Topics  . Smoking status: Former Smoker    Packs/day: 1.00    Years: 30.00    Types: Cigarettes    Quit date: 01/17/1972  . Smokeless tobacco:  Current User    Types: Chew  . Alcohol use No     Allergies   Avinza [morphine sulfate]; Doxycycline calcium; Vibramycin [doxycycline hyclate]; Flomax [tamsulosin hcl]; Statins; Codeine sulfate; and Oxycodone   Review of Systems Review of Systems  Unable to perform ROS: Dementia     Physical Exam Updated Vital Signs BP (!) 141/86 (BP Location: Right Arm)   Pulse 87   Temp 98.2 F (36.8 C) (Axillary)   Resp 20   SpO2 98%   Physical Exam  Constitutional: He appears well-developed and well-nourished. No distress.  HENT:  Head: Normocephalic and atraumatic.  Eyes:  Patient resisting to open eyes  Neck: Normal range of motion. Neck supple. No thyromegaly present.  Cardiovascular: Normal rate, regular rhythm, normal heart sounds and intact distal pulses.  Exam reveals no gallop and no friction rub.   No murmur heard. Pulmonary/Chest: Effort normal and breath sounds normal. No stridor. No respiratory distress. He has no wheezes. He has no rales.  Abdominal: Soft. Bowel sounds are normal. He exhibits no distension. There is generalized tenderness. There is guarding. There is no rebound.  Musculoskeletal: He exhibits no edema.  Lymphadenopathy:    He has no cervical  adenopathy.  Neurological: He is alert. Coordination normal.  Skin: Skin is warm and dry. No rash noted. He is not diaphoretic. There is pallor.  Psychiatric: He has a normal mood and affect.  Nursing note and vitals reviewed.    ED Treatments / Results  Labs (all labs ordered are listed, but only abnormal results are displayed) Labs Reviewed  COMPREHENSIVE METABOLIC PANEL - Abnormal; Notable for the following:       Result Value   Potassium 6.5 (*)    CO2 20 (*)    Glucose, Bld 122 (*)    BUN 133 (*)    Creatinine, Ser 8.72 (*)    Calcium 8.3 (*)    Albumin 2.7 (*)    GFR calc non Af Amer 5 (*)    GFR calc Af Amer 6 (*)    Anion gap 19 (*)    All other components within normal limits  CBC WITH DIFFERENTIAL/PLATELET - Abnormal; Notable for the following:    WBC 15.4 (*)    Neutro Abs 13.4 (*)    All other components within normal limits  LIPASE, BLOOD  URINALYSIS, ROUTINE W REFLEX MICROSCOPIC    EKG  EKG Interpretation None       Radiology Ct Abdomen Pelvis Wo Contrast  Result Date: 10/09/2016 CLINICAL DATA:  Abdominal pain. Technologist notes state hospice patient's with constipation. EXAM: CT ABDOMEN AND PELVIS WITHOUT CONTRAST TECHNIQUE: Multidetector CT imaging of the abdomen and pelvis was performed following the standard protocol without IV contrast. COMPARISON:  Most recent CT 10/01/2013 FINDINGS: Lower chest: Motion artifact. Small pleural effusions and adjacent atelectasis. Probable emphysema. Post CABG. The heart is enlarged. Hepatobiliary: Multiple water density lesions throughout the liver, similar to prior exam consistent with simple cysts. No evidence of new or solid lesion allowing for lack contrast. Mild nodular hepatic contours. Clips in the gallbladder fossa postcholecystectomy. No biliary dilatation. Pancreas: Parenchymal atrophy. No ductal dilatation or inflammation. Spleen: Normal in size without focal abnormality. Adrenals/Urinary Tract: Normal adrenal  glands. Prominence of the right renal collecting system in part secondary to parapelvic cysts as seen on prior exam. There is bilateral ureteral prominence likely secondary to bladder distention. No evidence of obstructing stone. Cortical scarring in the lower right kidney, not as well-defined on the  current exam. Punctate calcifications in the mid and lower right kidney are likely parenchymal. Nonobstructing stone in the upper left kidney versus vascular calcification. Bladder distention without wall thickening. Stomach/Bowel: Suboptimal bowel assessment given lack of enteric contrast and presence of intra-abdominal ascites. Small nonobstructing gastric wall lipoma in the peri pyloric region, unchanged. No bowel obstruction. Mild small bowel thickening of left bowel loops likely secondary to ascites. Moderate colonic stool burden with colonic tortuosity. No colonic inflammatory change. Appendix not definitively visualized. Stomach is decompressed with small hiatal hernia. Vascular/Lymphatic: Infrarenal abdominal aortic aneurysm maximal dimension 4.2 cm. Moderate aortic atherosclerosis. No periaortic soft tissue stranding. Limited assessment for adenopathy, no bulky adenopathy is seen. Reproductive: Dystrophic calcifications in the prostate gland/prostate bed. Other: Moderate abdominopelvic and mesenteric ascites. Mild mesenteric edema. Post repair of right inguinal hernia. No free air. Musculoskeletal: Degenerative change throughout spine. There are no acute or suspicious osseous abnormalities. IMPRESSION: 1. Moderate colonic stool burden suggesting constipation. No evidence of bowel inflammation or obstruction. 2. Nodular hepatic contours consistent with cirrhosis. Multiple hepatic cysts without evidence of solid lesion on noncontrast exam. Moderate volume abdominopelvic ascites. 3. Bladder distention with mild resultant ureteral dilatation. No bladder wall thickening. 4. Aortic atherosclerosis with abdominal  aortic aneurysm, maximal dimension 4.2 cm. Follow-up in surgical consultation based on patient's comorbidities. ACR consensus guidelines recommend followup by ultrasound in 1 year given size. Electronically Signed   By: Jeb Levering M.D.   On: 10/07/2016 23:37   Dg Chest Portable 1 View  Result Date: 09/18/2016 CLINICAL DATA:  Cough EXAM: PORTABLE CHEST 1 VIEW COMPARISON:  04/29/2016 FINDINGS: Post sternotomy changes. Cervical spine hardware. Low lung volumes bilaterally. Bibasilar atelectasis or pneumonia. Mild cardiomegaly. Aortic atherosclerosis. No pneumothorax. IMPRESSION: 1. Low lung volumes with suspected tiny effusions and bibasilar atelectasis or pneumonia 2. Cardiomegaly Electronically Signed   By: Donavan Foil M.D.   On: 10/15/2016 20:35    Procedures Procedures (including critical care time)  Medications Ordered in ED Medications  calcium gluconate 1 g in sodium chloride 0.9 % 100 mL IVPB (not administered)  sodium bicarbonate injection 50 mEq (not administered)  liver oil-zinc oxide (DESITIN) 40 % ointment (not administered)  sodium polystyrene (KAYEXALATE) 15 GM/60ML suspension 15 g (not administered)  sodium chloride 0.9 % bolus 1,000 mL (1,000 mLs Intravenous New Bag/Given 10/04/2016 2351)     Initial Impression / Assessment and Plan / ED Course  I have reviewed the triage vital signs and the nursing notes.  Pertinent labs & imaging results that were available during my care of the patient were reviewed by me and considered in my medical decision making (see chart for details).     POA contact information (daughters): [name]: [phone] (mobile), [phone] (work) Tammy: [phone]  Patient with AKI (BUN 133, Cr 8.72), constipation, urinary retention, hyperkalemia (6.5). Foley placed in the ED. UA shows tract leukocytes, moderate hematuria, all most likely resulting from catheter placement. Fluids, calcium gluconate and sodium bicarbonate initiated in the ED. CT  abdomen pelvis shows moderate colonic stool burden, cirrhosis, bladder distention. I discussed findings with daughters (POAs) and they agreeable to Foley, fluids, antibiotics PRN.  Patient is DNR. POAs request consultation if anything invasive. I consulted [facility] and spoke with Dr. Hal Hope who will admit the patient for further evaluation and treatment. I discussed patient case with Dr. Wilson Singer who guided the patient's management and agrees with plan.   Final Clinical Impressions(s) / ED Diagnoses   Final diagnoses:  Generalized abdominal pain  AKI (acute  kidney injury) ([place])  Constipation, unspecified constipation type  Hyperkalemia    New Prescriptions New Prescriptions   No medications on file           Caryl Ada 10/12/16 0106    Virgel Manifold, MD 10/30/16 1141

## 2016-10-11 NOTE — ED Notes (Signed)
Bed: VD47 Expected date:  Expected time:  Means of arrival:  Comments: 83 yo abd pain

## 2016-10-11 NOTE — ED Triage Notes (Signed)
Per EMS;  Pt is a hospice patient coming from [facility]. Pt is non-verbal Pt started having constipation about a week and a half ago. The hospice MD ordered miralax and he has had 2 enemas with no relief.  Pt has a BM yesterday but it was not sufficient.  Pt is here for a CT abdomen per facility.   Last Vitals: 130/88 70 HR 95% on 4L 

## 2016-10-12 ENCOUNTER — Inpatient Hospital Stay: Admission: RE | Admit: 2016-10-12 | Payer: Self-pay | Source: Ambulatory Visit

## 2016-10-12 ENCOUNTER — Encounter (HOSPITAL_COMMUNITY): Payer: Self-pay | Admitting: Internal Medicine

## 2016-10-12 DIAGNOSIS — I129 Hypertensive chronic kidney disease with stage 1 through stage 4 chronic kidney disease, or unspecified chronic kidney disease: Secondary | ICD-10-CM | POA: Diagnosis present

## 2016-10-12 DIAGNOSIS — N401 Enlarged prostate with lower urinary tract symptoms: Secondary | ICD-10-CM | POA: Diagnosis not present

## 2016-10-12 DIAGNOSIS — G309 Alzheimer's disease, unspecified: Secondary | ICD-10-CM | POA: Diagnosis not present

## 2016-10-12 DIAGNOSIS — G9341 Metabolic encephalopathy: Secondary | ICD-10-CM | POA: Diagnosis present

## 2016-10-12 DIAGNOSIS — E875 Hyperkalemia: Secondary | ICD-10-CM | POA: Diagnosis present

## 2016-10-12 DIAGNOSIS — E039 Hypothyroidism, unspecified: Secondary | ICD-10-CM | POA: Diagnosis not present

## 2016-10-12 DIAGNOSIS — N179 Acute kidney failure, unspecified: Secondary | ICD-10-CM | POA: Diagnosis not present

## 2016-10-12 DIAGNOSIS — Z87442 Personal history of urinary calculi: Secondary | ICD-10-CM | POA: Diagnosis not present

## 2016-10-12 DIAGNOSIS — M159 Polyosteoarthritis, unspecified: Secondary | ICD-10-CM | POA: Diagnosis present

## 2016-10-12 DIAGNOSIS — F0151 Vascular dementia with behavioral disturbance: Secondary | ICD-10-CM | POA: Diagnosis present

## 2016-10-12 DIAGNOSIS — G9340 Encephalopathy, unspecified: Secondary | ICD-10-CM | POA: Diagnosis not present

## 2016-10-12 DIAGNOSIS — F339 Major depressive disorder, recurrent, unspecified: Secondary | ICD-10-CM | POA: Diagnosis not present

## 2016-10-12 DIAGNOSIS — I714 Abdominal aortic aneurysm, without rupture: Secondary | ICD-10-CM | POA: Diagnosis present

## 2016-10-12 DIAGNOSIS — H9190 Unspecified hearing loss, unspecified ear: Secondary | ICD-10-CM | POA: Diagnosis present

## 2016-10-12 DIAGNOSIS — K219 Gastro-esophageal reflux disease without esophagitis: Secondary | ICD-10-CM | POA: Diagnosis not present

## 2016-10-12 DIAGNOSIS — Z8249 Family history of ischemic heart disease and other diseases of the circulatory system: Secondary | ICD-10-CM | POA: Diagnosis not present

## 2016-10-12 DIAGNOSIS — Z87891 Personal history of nicotine dependence: Secondary | ICD-10-CM | POA: Diagnosis not present

## 2016-10-12 DIAGNOSIS — F329 Major depressive disorder, single episode, unspecified: Secondary | ICD-10-CM | POA: Diagnosis present

## 2016-10-12 DIAGNOSIS — Z85819 Personal history of malignant neoplasm of unspecified site of lip, oral cavity, and pharynx: Secondary | ICD-10-CM | POA: Diagnosis not present

## 2016-10-12 DIAGNOSIS — Z881 Allergy status to other antibiotic agents status: Secondary | ICD-10-CM | POA: Diagnosis not present

## 2016-10-12 DIAGNOSIS — Z823 Family history of stroke: Secondary | ICD-10-CM | POA: Diagnosis not present

## 2016-10-12 DIAGNOSIS — N183 Chronic kidney disease, stage 3 (moderate): Secondary | ICD-10-CM | POA: Diagnosis not present

## 2016-10-12 DIAGNOSIS — N138 Other obstructive and reflux uropathy: Secondary | ICD-10-CM | POA: Diagnosis present

## 2016-10-12 DIAGNOSIS — I2510 Atherosclerotic heart disease of native coronary artery without angina pectoris: Secondary | ICD-10-CM | POA: Diagnosis present

## 2016-10-12 DIAGNOSIS — F0281 Dementia in other diseases classified elsewhere with behavioral disturbance: Secondary | ICD-10-CM | POA: Diagnosis not present

## 2016-10-12 DIAGNOSIS — Z951 Presence of aortocoronary bypass graft: Secondary | ICD-10-CM | POA: Diagnosis not present

## 2016-10-12 DIAGNOSIS — H409 Unspecified glaucoma: Secondary | ICD-10-CM | POA: Diagnosis not present

## 2016-10-12 DIAGNOSIS — E785 Hyperlipidemia, unspecified: Secondary | ICD-10-CM | POA: Diagnosis not present

## 2016-10-12 DIAGNOSIS — I10 Essential (primary) hypertension: Secondary | ICD-10-CM | POA: Diagnosis not present

## 2016-10-12 DIAGNOSIS — I739 Peripheral vascular disease, unspecified: Secondary | ICD-10-CM | POA: Diagnosis not present

## 2016-10-12 DIAGNOSIS — I5030 Unspecified diastolic (congestive) heart failure: Secondary | ICD-10-CM | POA: Diagnosis not present

## 2016-10-12 DIAGNOSIS — I25119 Atherosclerotic heart disease of native coronary artery with unspecified angina pectoris: Secondary | ICD-10-CM | POA: Diagnosis not present

## 2016-10-12 DIAGNOSIS — J690 Pneumonitis due to inhalation of food and vomit: Secondary | ICD-10-CM | POA: Diagnosis present

## 2016-10-12 DIAGNOSIS — Z9049 Acquired absence of other specified parts of digestive tract: Secondary | ICD-10-CM | POA: Diagnosis not present

## 2016-10-12 LAB — BASIC METABOLIC PANEL
Anion gap: 19 — ABNORMAL HIGH (ref 5–15)
BUN: 130 mg/dL — AB (ref 6–20)
CO2: 21 mmol/L — ABNORMAL LOW (ref 22–32)
CREATININE: 8.19 mg/dL — AB (ref 0.61–1.24)
Calcium: 8.5 mg/dL — ABNORMAL LOW (ref 8.9–10.3)
Chloride: 103 mmol/L (ref 101–111)
GFR calc Af Amer: 6 mL/min — ABNORMAL LOW (ref >60)
GFR, EST NON AFRICAN AMERICAN: 5 mL/min — AB (ref >60)
GLUCOSE: 119 mg/dL — AB (ref 65–99)
POTASSIUM: 5.6 mmol/L — AB (ref 3.5–5.1)
Sodium: 143 mmol/L (ref 135–145)

## 2016-10-12 LAB — URINALYSIS, ROUTINE W REFLEX MICROSCOPIC
Bilirubin Urine: NEGATIVE
Glucose, UA: NEGATIVE mg/dL
Ketones, ur: NEGATIVE mg/dL
Nitrite: NEGATIVE
Protein, ur: 100 mg/dL — AB
Specific Gravity, Urine: 1.016 (ref 1.005–1.030)
Squamous Epithelial / LPF: NONE SEEN
pH: 5 (ref 5.0–8.0)

## 2016-10-12 LAB — CBC
HEMATOCRIT: 42.5 % (ref 39.0–52.0)
Hemoglobin: 14.2 g/dL (ref 13.0–17.0)
MCH: 32.1 pg (ref 26.0–34.0)
MCHC: 33.4 g/dL (ref 30.0–36.0)
MCV: 95.9 fL (ref 78.0–100.0)
Platelets: 308 10*3/uL (ref 150–400)
RBC: 4.43 MIL/uL (ref 4.22–5.81)
RDW: 13.6 % (ref 11.5–15.5)
WBC: 15.5 10*3/uL — ABNORMAL HIGH (ref 4.0–10.5)

## 2016-10-12 LAB — VALPROIC ACID LEVEL: Valproic Acid Lvl: 20 ug/mL — ABNORMAL LOW (ref 50.0–100.0)

## 2016-10-12 MED ORDER — ESCITALOPRAM OXALATE 10 MG PO TABS
20.0000 mg | ORAL_TABLET | Freq: Every day | ORAL | Status: DC
  Administered 2016-10-12: 20 mg via ORAL
  Filled 2016-10-12 (×2): qty 2

## 2016-10-12 MED ORDER — PIPERACILLIN-TAZOBACTAM IN DEX 2-0.25 GM/50ML IV SOLN
2.2500 g | Freq: Three times a day (TID) | INTRAVENOUS | Status: DC
  Administered 2016-10-12 – 2016-10-13 (×3): 2.25 g via INTRAVENOUS
  Filled 2016-10-12 (×5): qty 50

## 2016-10-12 MED ORDER — POLYETHYLENE GLYCOL 3350 17 G PO PACK
17.0000 g | PACK | Freq: Two times a day (BID) | ORAL | Status: DC
  Administered 2016-10-12 – 2016-10-13 (×2): 17 g via ORAL
  Filled 2016-10-12 (×2): qty 1

## 2016-10-12 MED ORDER — SODIUM POLYSTYRENE SULFONATE 15 GM/60ML PO SUSP
15.0000 g | Freq: Once | ORAL | Status: AC
  Administered 2016-10-12: 15 g via ORAL
  Filled 2016-10-12: qty 60

## 2016-10-12 MED ORDER — BRIMONIDINE TARTRATE 0.15 % OP SOLN
1.0000 [drp] | Freq: Every day | OPHTHALMIC | Status: DC
  Administered 2016-10-12 – 2016-10-16 (×5): 1 [drp] via OPHTHALMIC
  Filled 2016-10-12: qty 5

## 2016-10-12 MED ORDER — VALPROATE SODIUM 250 MG/5ML PO SOLN
500.0000 mg | Freq: Two times a day (BID) | ORAL | Status: DC
  Administered 2016-10-12 – 2016-10-13 (×2): 500 mg via ORAL
  Filled 2016-10-12 (×3): qty 10

## 2016-10-12 MED ORDER — ACETAMINOPHEN 325 MG PO TABS: 650.0000 mg | ORAL_TABLET | Freq: Four times a day (QID) | ORAL | Status: DC | PRN

## 2016-10-12 MED ORDER — SODIUM POLYSTYRENE SULFONATE 15 GM/60ML PO SUSP
30.0000 g | Freq: Once | ORAL | Status: AC
  Administered 2016-10-12: 30 g via ORAL
  Filled 2016-10-12: qty 120

## 2016-10-12 MED ORDER — ONDANSETRON HCL 4 MG/2ML IJ SOLN
4.0000 mg | Freq: Four times a day (QID) | INTRAMUSCULAR | Status: DC | PRN
  Filled 2016-10-12: qty 2

## 2016-10-12 MED ORDER — ENSURE ENLIVE PO LIQD
237.0000 mL | Freq: Two times a day (BID) | ORAL | Status: DC
  Filled 2016-10-12: qty 237

## 2016-10-12 MED ORDER — LIP MEDEX EX OINT
TOPICAL_OINTMENT | CUTANEOUS | Status: AC
  Filled 2016-10-12: qty 7

## 2016-10-12 MED ORDER — ACETAMINOPHEN 650 MG RE SUPP: 650.0000 mg | Freq: Four times a day (QID) | RECTAL | Status: DC | PRN

## 2016-10-12 MED ORDER — TRAZODONE HCL 50 MG PO TABS: 100.0000 mg | ORAL_TABLET | Freq: Every day | ORAL | Status: DC

## 2016-10-12 MED ORDER — ONDANSETRON HCL 4 MG PO TABS: 4.0000 mg | ORAL_TABLET | Freq: Four times a day (QID) | ORAL | Status: DC | PRN

## 2016-10-12 MED ORDER — RISPERIDONE 1 MG PO TABS: 1.0000 mg | ORAL_TABLET | Freq: Every day | ORAL | Status: DC

## 2016-10-12 MED ORDER — LORAZEPAM 0.5 MG PO TABS: 0.5000 mg | ORAL_TABLET | Freq: Two times a day (BID) | ORAL | Status: DC | PRN

## 2016-10-12 MED ORDER — ZINC OXIDE 40 % EX OINT
TOPICAL_OINTMENT | Freq: Every day | CUTANEOUS | Status: DC
  Filled 2016-10-12: qty 57

## 2016-10-12 MED ORDER — SODIUM CHLORIDE 0.9 % IV SOLN
INTRAVENOUS | Status: DC
  Administered 2016-10-12 (×2): via INTRAVENOUS

## 2016-10-12 MED ORDER — FINASTERIDE 5 MG PO TABS
5.0000 mg | ORAL_TABLET | Freq: Every day | ORAL | Status: DC
  Administered 2016-10-12: 5 mg via ORAL
  Filled 2016-10-12 (×3): qty 1

## 2016-10-12 NOTE — Clinical Social Work Note (Signed)
Clinical Social Work Assessment  Patient Details  Name: [name] MRN: [id number] Date of Birth: [birth year]  Date of referral:  10/12/16               Reason for consult:   (pt admitted from facility)                Permission sought to share information with:    Permission granted to share information::     Name::     daughters Gertie Exon  Agency::  [facility] [phone]  Relationship::     Contact Information:     Housing/Transportation Living arrangements for the past 2 months:  [place] of Information:  Adult Children, Scientist, water quality, Facility Patient Interpreter Needed:  None Criminal Activity/Legal Involvement Pertinent to Current Situation/Hospitalization:  No - Comment as needed Significant Relationships:  Adult Children, Warehouse manager Lives with:  Facility Resident Do you feel safe going back to the place where you live?  Yes Need for family participation in patient care:  Yes (Comment) (pt with dementia)  Care giving concerns:  Pt from [facility] memory care. Has 1:1 sitter there employed by family. Needs total assistance with ambulation/adls.  Has resided there since 10/2015. Prior to that lived at home- wife was caretaker until she passed away 23-Apr-2015 and pt quickly declined after that per daughters.    Social Worker assessment / plan:  CSW consulted as pt is from facility- [facility]. Met with pt/daughters at bedside. Pt noticeably aspirating. Eyes closed and unengaged.  Daughters provided pt's hx above and report plan to return to [facility] if appropriate based on care needs once closer to [place]. Contacted facility and provided information in the [place]. Pt will need updated FL2.  Plan: to return to [facility] at [place].  Employment status:  Retired Forensic scientist:  Medicare PT Recommendations:  Not assessed at this time Information / Referral to community resources:     Patient/Family's Response  to care:  Pt not interactive. Daughters very engaged and appreciative of care  Patient/Family's Understanding of and Emotional Response to Diagnosis, Current Treatment, and Prognosis:  Pt's daughters show thorough understanding of pt's care and history. Seem upset re: his condition but were very appropriate to situation and seem well- adjusted.  Emotional Assessment Appearance:  Appears stated age Attitude/Demeanor/Rapport:   (not interactive) Affect (typically observed):   (eyes closed, not engaged/oriented) Orientation:  Fluctuating Orientation (Suspected and/or reported [place]) (not interactive at time of assessment to guage orientation. Per daughters oriented at times to person/place) Alcohol / Substance use:  Not Applicable Psych involvement (Current and /or in the community):  No (Comment)  Discharge Needs  Concerns to be addressed:  No discharge needs identified (still assessing) Readmission within the last 30 days:  No Current discharge risk:  None Barriers to Discharge:  Continued Medical Work up   [employer], LCSW 10/12/2016, 3:30 PM  [phone]

## 2016-10-12 NOTE — Progress Notes (Signed)
Patient admitted after midnight. For details please refer to admission note done 10/12/2016. 83 year old male with advanced dementia, mostly bedbound, coronary artery disease status post CABG who presented to ER with persistent abdominal discomfort for past few days prior to the admission. He was on Cipro for possible diverticulitis. In ED, patient was hemodynamically stable. CT scan showed moderate stool burden with distended bladder and ureter. The labs revealed acute renal failure with creatinine of 8.7 which has markedly increased from April 2018 at which time patient had normal creatinine. Potassium was 6.5. Foley catheter was placed following which patient had good urine output. Patient was given Kayexalate, calcium gluconate in ER for hyperkalemia.  Assessment and plan:  Acute renal failure secondary to obstructive uropathy - Foley catheter placed in ED - There is only slight improvement in creatinine since admission, 8.72 down to 8.19 - Continue to monitor BMP  Hyperkalemia - Due to acute renal failure - Given Kayexalate and calcium gluconate in ED - Potassium is 5.6 this morning - We'll give another dose of kayexalate   Failure to thrive in adult - Patient follows with hospice at home - Palliative care consulted   Leisa Lenz [facility] [phone]

## 2016-10-12 NOTE — Progress Notes (Addendum)
[facility]_Follow up visit requested by family. 16:00  Received a message that family requested a visit from Hospice RN today.  Patient aspirated after eating lunch. Respiratory provided suction. Patient was made NPO. His daughters are now interested in having PMT consult for guidance with care and prognosis. PMT was notified and will follow up with patient. Provided emotional support to family.  [facility] liaison will follow up tomorrow 10/13/2016.   Receiving NS @ 65ml/hr, Zosyn 2.25gm IV q8h No PRN medications have been given.   Will continue to follow while hospitalized and anticipate needs.   Please be aware that [facility] is contracted with [facility] for any transfers to facilities or home.  Please call with any hospice related questions.   Thank you Farrel Gordon, RN, [facility] Liaison  [phone]   All hospital liaisons are on [place]

## 2016-10-12 NOTE — Progress Notes (Signed)
Palliative Medicine RN Note: Pt is an active hospice pt with [facility]. I spoke with Oliver [facility]re [facility], and they are going to see the pt this am. When patients are admitted under the hospice benefit, hospice generally addresses all GOC, so we will not see the patient. [facility] will call us, however, if they need our assistance.  Marjie Skiff Brunella Wileman, RN, BSN, [facility] 10/12/2016 9:55 AM Cell [phone] 8:00-4:00 Monday-Friday Office [phone]

## 2016-10-12 NOTE — Progress Notes (Signed)
Pharmacy Antibiotic Note  [name] is a 83 y.o. male admitted on 10/08/2016 with intra-abdominal infection.  He is followed by Hospice and has been on Cipro PTA.  Pharmacy has been consulted for Zosyn dosing. 10/12/2016:  Afebrile  Leukocytosis (WBC 15.5, ANC 13.4)  Acute kidney injury (Scr 8.19, CrCl<75ml/min)  Plan: Zosyn 2.25gm IV q8h Monitor renal function     Temp (24hrs), Avg:98.2 F (36.8 C), Min:98.1 F (36.7 C), Max:98.2 F (36.8 C)   Recent Labs Lab 09/22/2016 2150 10/12/16 0456  WBC 15.4* 15.5*  CREATININE 8.72* 8.19*    CrCl cannot be calculated (Unknown ideal weight.).    Allergies  Allergen Reactions  . Avinza [Morphine Sulfate] Other (See Comments)    Altered mental status. "about killed him"  . Doxycycline Calcium Other (See Comments)    Causes patient chronic colitis; was hospitalized two weeks due to med.  . Vibramycin [Doxycycline Hyclate] Other (See Comments)    Causes patient colitis; was hospitalized two weeks due to med  . Flomax [Tamsulosin Hcl] Other (See Comments)    Dizziness  . Statins Other (See Comments)    Muscle pain; can tolerate crestor 10 mg every other day  . Codeine Sulfate Nausea And Vomiting  . Oxycodone Other (See Comments)    Angry; altered mental status; "wild and crazy"    Antimicrobials this admission: 9/27 Zosyn >>  **9/21 Cipro PO as outpatient  Dose adjustments this admission:  Microbiology results: None  Thank you for allowing pharmacy to be a part of this patient's care.  Biagio Borg 10/12/2016 2:53 PM

## 2016-10-12 NOTE — Progress Notes (Signed)
[facility] RN Visit.  This is a related and covered GIP admission of 10/12/2016 with [facility] diagnosis of Alzheimer's disease per Dr. Earlie Counts.  Code status: OOF DNR on chart. ?Patient  transported form [facility] to ED for evaluation of abdominal pain and constipation. Hospice was notified.   Visited with patient and family at bedside. Patient appears comfortable. Respirations unlabored and even.   He did not respond to my voice. He has an IV in forearm. Foley catheter draining dark brownish urine. Provided emotional support to daughter.   Receiving NS @ 69ml/hr.  No other medications have been given.   Dr.  Lavell Anchors and Dr. Earlie Counts notified of admission. Transfer Summary and medication list placed on shadow chart.   Will continue to follow while hospitalized and anticipate discharge needs.   Please be aware that [facility] is contracted with [facility] for any transfers to facilities or home.  Please call with any hospice related questions.   Thank you Farrel Gordon, RN, [facility] Liaison  [phone]   All hospital liaisons are on [place]

## 2016-10-12 NOTE — Progress Notes (Signed)
Rt NT SUCTION pt. Vitals stable, bs coarse and sitter at bedside. Pt had no adverse reaction to suction. Pt had thin white sputum.

## 2016-10-12 NOTE — Progress Notes (Signed)
At 27 RN was called to room by private care giver, care giver had fed patient his pureed lunch and now patient has aspirated and has very coarse breath sounds. Respiratory Therapy consulted and RT Lisa suctioned patient orally. Suctioning produce large amount of aspirated food and liquids. Patient still sounding very coarse. MD Dr Charlies Silvers paged for order for NT suctioning.Order received at 1350. Respiratory completed NT suction at 1415, breath sounds coarse. Dr Charlies Silvers notified and order given for NPO and Zosyn. Daughter [name] notified of change by phone. Will continue to monitor.

## 2016-10-12 NOTE — H&P (Signed)
History and Physical    [name]:[id number] DOB: 10-18-1931 DOA: 09/23/2016  PCP: Brett Olp, [name]  Patient coming from: Home.  Chief Complaint: Abdominal discomfort.  HPI: [name] is a 83 y.o. male with history of advanced dementia largely bedbound, CAD status post CABG was brought to the ER after patient has been having persistent abdominal discomfort. As per the daughter who provided the history to the ER physician patient has been having some abdominal discomfort past few days and was placed on Cipro for possible diverticulitis. Patient has benign poor appetite. Patient was given an enema following which patient had good bowel movement. Despite which patient was still having abdominal discomfort and was brought to the ER.   ED Course: In the ER CT scan shows moderate stool burden with distended bladder and ureter. Labs reveal acute renal failure with creatinine of 8.7 which has markedly increased from April 2018 which had normal creatinine. Potassium was 6.5. Foley catheter was placed following which patient had good urine output. At this time patient's renal failure is assumed to be obstructive uropathy. Patient was given Kayexalate calcium gluconate in the ER and admitted for further management of acute renal failure. Patient's daughter who is also the power of attorney once correction of his dehydration and renal failure but no other aggressive measures.  Review of Systems: As per HPI, rest all negative.   Past Medical History:  Diagnosis Date  . Abdominal aortic aneurysm ([place])    a. 07/2014 Abd U/S: 3.61 cm, RCIA >50d.  Marland Kitchen Aphthous ulcer   . BPH (benign prostatic hypertrophy)   . CAD (coronary artery disease)    a. s/p CABG 1993;  b. cath 10/10: severe native 3v CAD, S-Dx ok, S-OM1/OM2 ok, S-PDA ok, [name]-LAD ok, EF 60%.  . Carotid stenosis    a. s/p Left CEA, followed by Dr. Donnetta [name];  b. 07/2014 u/s: RICA < 20, LICA patent.  . Cervical spondylosis without myelopathy   .  CKD (chronic kidney disease), stage III   . GERD (gastroesophageal reflux disease)   . History of nephrolithiasis   . HOH (hard of hearing)    hearing aids  . Hyperlipidemia   . Memory deficits 06/19/2013  . Pre-syncope   . Radicular low back pain   . Throat cancer ([place])   . 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 Unspecified adverse effect of unspecified drug, medicinal and biological substance     Past Surgical History:  Procedure Laterality Date  . BACK SURGERY    . CARDIAC CATHETERIZATION    . CAROTID ENDARTERECTOMY Left   . CHOLECYSTECTOMY    . CORONARY ARTERY BYPASS GRAFT    . CORONARY ARTERY BYPASS GRAFT  1998  . HERNIA REPAIR     unclear which side  . INGUINAL HERNIA REPAIR Right 09/29/2013   Procedure: HERNIA REPAIR INGUINAL ADULT;  Surgeon: [name];  Location: [facility];  Service: General;  Laterality: Right;  . INSERTION OF MESH Right 09/29/2013   Procedure: INSERTION OF MESH;  Surgeon: [name];  Location: [facility];  Service: General;  Laterality: Right;  . PROSTATE SURGERY    . renal stone surgery     3 times  . THROAT SURGERY       reports that he quit smoking about 44 years ago. His smoking use included Cigarettes. He has a 30.00 pack-year smoking history. His smokeless tobacco use includes Chew. He reports that he does  not drink alcohol or use drugs.  Allergies  Allergen Reactions  . Avinza [Morphine Sulfate] Other (See Comments)    Altered mental status. "about killed him"  . Doxycycline Calcium Other (See Comments)    Causes patient chronic colitis; was hospitalized two weeks due to med.  . Vibramycin [Doxycycline Hyclate] Other (See Comments)    Causes patient colitis; was hospitalized two weeks due to med  . Flomax [Tamsulosin Hcl] Other (See Comments)    Dizziness  . Statins Other (See Comments)    Muscle pain; can tolerate crestor 10 mg every other day  . Codeine Sulfate Nausea And Vomiting  . Oxycodone Other (See  Comments)    Angry; altered mental status; "wild and crazy"    Family History  Problem Relation Age of Onset  . Heart disease Father   . Stroke Father   . AAA (abdominal aortic aneurysm) Sister   . AAA (abdominal aortic aneurysm) Brother   . Stroke Brother   . Coronary artery disease Brother   . Heart disease Brother   . Coronary artery disease Unknown     Prior to Admission medications   Medication Sig Start Date End Date Taking? Authorizing Provider  acetaminophen (TYLENOL) 500 MG tablet Take 2 tablets (1,000 mg total) by mouth every 8 (eight) hours. 09/29/15  Yes [name]  ALPHAGAN P 0.1 % SOLN Place 1 drop into both eyes at bedtime.  04/24/13  Yes [name]  ciprofloxacin (CIPRO) 500 MG tablet Take 500 mg by mouth 2 (two) times daily.   Yes [name]  escitalopram (LEXAPRO) 5 MG/5ML solution Take 20 mLs by mouth daily. 10/04/16  Yes [name]  feeding supplement, ENSURE ENLIVE, (ENSURE ENLIVE) LIQD Take 237 mLs by mouth 2 (two) times daily between meals. 05/01/16  Yes [name]  finasteride (PROSCAR) 5 MG tablet Take 5 mg by mouth daily. 11/12/15  Yes [name]  LORazepam (ATIVAN) 1 MG tablet Take 0.5-1 tablets (0.5-1 mg total) by mouth 2 (two) times daily as needed (agitation/aggressive behavior). Patient taking differently: Take 0.5 mg by mouth 2 (two) times daily.  11/29/15  Yes [name]  permethrin (ELIMITE) 5 % cream Apply 1 application topically once a week. 09/26/16  Yes [name]  polyethylene glycol (MIRALAX / GLYCOLAX) packet Take 17 g by mouth 2 (two) times daily. 09/29/15  Yes Brett Hacker [name], [name]  risperiDONE (RISPERDAL) 1 MG tablet Take 1 mg by mouth at bedtime.   Yes [name]  traZODone (DESYREL) 100 MG tablet Take 100 mg by mouth at bedtime.    Yes [name]  Valproate Sodium (VALPROIC ACID) 250 MG/5ML SOLN Take 10 mLs  by mouth 2 (two) times daily.   Yes [name]  aspirin 81 MG tablet Take 1 tablet (81 mg total) by mouth daily. Pt takes 1 tablet by mouth on Mon and Fri. Patient not taking: Reported on 09/17/2016 09/29/15   Acquanetta Chain, [name]  celecoxib (CELEBREX) 100 MG capsule Take 1 capsule (100 mg total) by mouth 2 (two) times daily. Patient not taking: Reported on 09/29/2016 09/29/15   Brett Hacker [name], [name]  escitalopram (LEXAPRO) 20 MG tablet TAKE ONE TABLET BY MOUTH ONCE DAILY Patient not taking: Reported on 10/03/2016 08/30/15   Brett Olp, [name]  OLANZapine (ZYPREXA) 5 MG tablet Take 1 tablet (5 mg total) by mouth at bedtime. Patient not taking: Reported on 10/01/2016 09/29/15  Acquanetta Chain, [name]    Physical Exam: Vitals:   10/04/2016 1905 10/12/16 0054  BP: (!) 141/86 (!) 150/81  Pulse: 87 92  Resp: 20 (!) 29  Temp: 98.2 F (36.8 C)   TempSrc: Axillary   SpO2: 98% 94%      Constitutional: Moderately built and nourished. Vitals:   10/06/2016 1905 10/12/16 0054  BP: (!) 141/86 (!) 150/81  Pulse: 87 92  Resp: 20 (!) 29  Temp: 98.2 F (36.8 C)   TempSrc: Axillary   SpO2: 98% 94%   Eyes: Anicteric no pallor. ENMT: No discharge from the ears eyes nose or mouth. Neck: No mass felt. No JVD appreciated. Respiratory: No rhonchi or crepitations. Cardiovascular: S1 and S2 heard no murmurs appreciated. Abdomen: Soft nontender bowel sounds present. Musculoskeletal: No edema. No joint effusion. Skin: No rash. Skin appears warm. Neurologic: Patient appears drowsy and does not follow commands. Psychiatric: Appears drowsy does not follow commands.   Labs on Admission: I have personally reviewed following labs and imaging studies  CBC:  Recent Labs Lab 10/06/2016 2150  WBC 15.4*  NEUTROABS 13.4*  HGB 14.3  HCT 41.8  MCV 93.7  PLT 818   Basic Metabolic Panel:  Recent Labs Lab 10/15/2016 2150  NA 140  K 6.5*  CL 101  CO2 20*  GLUCOSE 122*  BUN 133*    CREATININE 8.72*  CALCIUM 8.3*   GFR: CrCl cannot be calculated (Unknown ideal weight.). Liver Function Tests:  Recent Labs Lab 09/28/2016 2150  AST 20  ALT 20  ALKPHOS 68  BILITOT 0.7  PROT 6.8  ALBUMIN 2.7*    Recent Labs Lab 10/12/2016 2150  LIPASE 15   No results for input(s): AMMONIA in the last 168 hours. Coagulation Profile: No results for input(s): INR, PROTIME in the last 168 hours. Cardiac Enzymes: No results for input(s): CKTOTAL, CKMB, CKMBINDEX, TROPONINI in the last 168 hours. BNP (last 3 results) No results for input(s): PROBNP in the last 8760 hours. HbA1C: No results for input(s): HGBA1C in the last 72 hours. CBG: No results for input(s): GLUCAP in the last 168 hours. Lipid Profile: No results for input(s): CHOL, HDL, LDLCALC, TRIG, CHOLHDL, LDLDIRECT in the last 72 hours. Thyroid Function Tests: No results for input(s): TSH, T4TOTAL, FREET4, T3FREE, THYROIDAB in the last 72 hours. Anemia Panel: No results for input(s): VITAMINB12, FOLATE, FERRITIN, TIBC, IRON, RETICCTPCT in the last 72 hours. Urine analysis:    Component Value Date/Time   COLORURINE YELLOW 09/26/2016 2308   APPEARANCEUR TURBID (A) 10/06/2016 2308   LABSPEC 1.016 09/16/2016 2308   PHURINE 5.0 10/08/2016 2308   GLUCOSEU NEGATIVE 10/10/2016 2308   HGBUR MODERATE (A) 10/02/2016 2308   BILIRUBINUR NEGATIVE 10/08/2016 2308   KETONESUR NEGATIVE 09/17/2016 2308   PROTEINUR 100 (A) 10/06/2016 2308   UROBILINOGEN 0.2 10/01/2013 1545   NITRITE NEGATIVE 10/05/2016 2308   LEUKOCYTESUR TRACE (A) 10/04/2016 2308   Sepsis Labs: @LABRCNTIP (procalcitonin:4,lacticidven:4) )No results found for this or any previous visit (from the past 240 hour(s)).   Radiological Exams on Admission: Ct Abdomen Pelvis Wo Contrast  Result Date: 10/06/2016 CLINICAL DATA:  Abdominal pain. Technologist notes state hospice patient's with constipation. EXAM: CT ABDOMEN AND PELVIS WITHOUT CONTRAST TECHNIQUE:  Multidetector CT imaging of the abdomen and pelvis was performed following the standard protocol without IV contrast. COMPARISON:  Most recent CT 10/01/2013 FINDINGS: Lower chest: Motion artifact. Small pleural effusions and adjacent atelectasis. Probable emphysema. Post CABG. The heart is enlarged. Hepatobiliary: Multiple water density  lesions throughout the liver, similar to prior exam consistent with simple cysts. No evidence of new or solid lesion allowing for lack contrast. Mild nodular hepatic contours. Clips in the gallbladder fossa postcholecystectomy. No biliary dilatation. Pancreas: Parenchymal atrophy. No ductal dilatation or inflammation. Spleen: Normal in size without focal abnormality. Adrenals/Urinary Tract: Normal adrenal glands. Prominence of the right renal collecting system in part secondary to parapelvic cysts as seen on prior exam. There is bilateral ureteral prominence likely secondary to bladder distention. No evidence of obstructing stone. Cortical scarring in the lower right kidney, not as well-defined on the current exam. Punctate calcifications in the mid and lower right kidney are likely parenchymal. Nonobstructing stone in the upper left kidney versus vascular calcification. Bladder distention without wall thickening. Stomach/Bowel: Suboptimal bowel assessment given lack of enteric contrast and presence of intra-abdominal ascites. Small nonobstructing gastric wall lipoma in the peri pyloric region, unchanged. No bowel obstruction. Mild small bowel thickening of left bowel loops likely secondary to ascites. Moderate colonic stool burden with colonic tortuosity. No colonic inflammatory change. Appendix not definitively visualized. Stomach is decompressed with small hiatal hernia. Vascular/Lymphatic: Infrarenal abdominal aortic aneurysm maximal dimension 4.2 cm. Moderate aortic atherosclerosis. No periaortic soft tissue stranding. Limited assessment for adenopathy, no bulky adenopathy is  seen. Reproductive: Dystrophic calcifications in the prostate gland/prostate bed. Other: Moderate abdominopelvic and mesenteric ascites. Mild mesenteric edema. Post repair of right inguinal hernia. No free air. Musculoskeletal: Degenerative change throughout spine. There are no acute or suspicious osseous abnormalities. IMPRESSION: 1. Moderate colonic stool burden suggesting constipation. No evidence of bowel inflammation or obstruction. 2. Nodular hepatic contours consistent with cirrhosis. Multiple hepatic cysts without evidence of solid lesion on noncontrast exam. Moderate volume abdominopelvic ascites. 3. Bladder distention with mild resultant ureteral dilatation. No bladder wall thickening. 4. Aortic atherosclerosis with abdominal aortic aneurysm, maximal dimension 4.2 cm. Follow-up in surgical consultation based on patient's comorbidities. ACR consensus guidelines recommend followup by ultrasound in 1 year given size. Electronically Signed   By: Jeb Levering M.D.   On: 09/30/2016 23:37   Dg Chest Portable 1 View  Result Date: 10/10/2016 CLINICAL DATA:  Cough EXAM: PORTABLE CHEST 1 VIEW COMPARISON:  04/29/2016 FINDINGS: Post sternotomy changes. Cervical spine hardware. Low lung volumes bilaterally. Bibasilar atelectasis or pneumonia. Mild cardiomegaly. Aortic atherosclerosis. No pneumothorax. IMPRESSION: 1. Low lung volumes with suspected tiny effusions and bibasilar atelectasis or pneumonia 2. Cardiomegaly Electronically Signed   By: Donavan Foil M.D.   On: 09/23/2016 20:35    EKG: Independently reviewed. Normal sinus rhythm with prominent T waves. Normal QRS and QTc.  Assessment/Plan Principal Problem:   ARF (acute renal failure) ([facility]) Active Problems:   Essential hypertension   CAD (coronary artery disease) s/p bypass graft   PVD- hx CEA,patent carotids by doppler July 2015   AAA (abdominal aortic aneurysm) without rupture ([facility])   Acute encephalopathy   Vascular dementia with behavior  disturbance    1. Acute renal failure on chronic kidney disease stage III likely from obstructive uropathy with hyperkalemia - patient is placed on Foley catheter. If creatinine improves then we'll continue to monitor. No further workup as requested by patient's daughter. Patient is placed on gentle hydration. 2. Constipation - patient probably will need enema. Patient has received Kayexalate. 3. History of CAD status post CABG. 4. Dementia with behavioral disturbances - continue home medications. Check Depakote levels.  I have reviewed patient's old charts labs.   DVT prophylaxis: SCDs since patient has mild hematuria after Foley placement.  Code Status: [name] NOT RESUSCITATE.  Family Communication: No family at the bedside.  Disposition Plan: Back to home.  Consults called: None.  Admission status: Inpatient.    Rise Patience [name] [facility] Pager [phone].  If 7PM-7AM, please contact night-coverage [email or web address] Password [facility]  10/12/2016, 2:49 AM

## 2016-10-13 MED ORDER — LORAZEPAM 2 MG/ML IJ SOLN: 1.0000 mg | INTRAMUSCULAR | Status: DC | PRN

## 2016-10-13 MED ORDER — SODIUM CHLORIDE 0.9 % IV SOLN
1.0000 mg/h | INTRAVENOUS | Status: DC
  Administered 2016-10-13: 0.25 mg/h via INTRAVENOUS
  Administered 2016-10-17 (×3): 1 mg/h via INTRAVENOUS
  Filled 2016-10-13 (×2): qty 10

## 2016-10-13 MED ORDER — GLYCOPYRROLATE 0.2 MG/ML IJ SOLN
0.1000 mg | INTRAMUSCULAR | Status: DC | PRN
  Filled 2016-10-13: qty 0.5

## 2016-10-13 NOTE — Progress Notes (Addendum)
Patient ID: [name], male   DOB: September 09, 1931, 83 y.o.   MRN: [id number]  PROGRESS NOTE    GLENARD KEESLING  CZY:[id number] DOB: 20-May-1931 DOA: 10/15/2016  PCP: Marin Olp, MD   Brief Narrative:  83 year old male with advanced dementia, mostly bedbound, coronary artery disease status post CABG who presented to ER with persistent abdominal discomfort for past few days prior to the admission. He was on Cipro for possible diverticulitis. In ED, patient was hemodynamically stable. CT scan showed moderate stool burden with distended bladder and ureter. The labs revealed acute renal failure with creatinine of 8.7 which has markedly increased from April 2018 at which time patient had normal creatinine. Potassium was 6.5. Foley catheter was placed following which patient had good urine output. Patient was given Kayexalate, calcium gluconate in ER for hyperkalemia. Palliative care team met with the family, at this time family wishes to proceed with full comfort care.   Assessment & Plan:   Acute renal failure secondary to obstructive uropathy - Foley catheter placed in ED - There was only minimal improvement in renal function with placement of Foley catheter - No further blood work as focus is currently on comfort care  Acute metabolic encephalopathy / dementia without behavioral disturbance - Encephalopathy likely secondary to combination of dementia as well as renal failure, possibly uremia in addition to pneumonia  Hyperkalemia - Due to acute renal failure - Given Kayexalate and calcium gluconate in ED - No further blood work, focus on comfort  Failure to thrive in adult - Patient follows with hospice at home - Palliative care assistant with goals of care, appreciate their help  Aspiration pneumonia - Patient was on Zosyn 10/12/2016 but this is now stopped as focus is on comfort   DVT prophylaxis: Comfort measures, SCDs Code Status: DNR/DNI Family Communication: Caregiver at  the bedside Disposition Plan: not yet stable for discharge    Consultants:   PCT  Procedures:   None   Antimicrobials:   Zosyn 10/12/2016 --> 10/13/2016    Subjective: No overnight events.  Objective: Vitals:   10/12/16 1418 10/12/16 2042 10/13/16 0500 10/13/16 0509  BP: 132/82 (!) 150/89  (!) 128/94  Pulse: (!) 103 (!) 104  87  Resp: '20 18  16  ' Temp: 98.5 F (36.9 C) 98 F (36.7 C)  98 F (36.7 C)  TempSrc: Axillary Axillary  Axillary  SpO2: 92% 90%  92%  Weight:   76.2 kg (168 lb)     Intake/Output Summary (Last 24 hours) at 10/13/16 1345 Last data filed at 10/12/16 2000  Gross per 24 hour  Intake               50 ml  Output                0 ml  Net               50 ml   Filed Weights   10/13/16 0500  Weight: 76.2 kg (168 lb)    Examination:  General exam: Appears ill Respiratory system: rhonchorous, wheezing  Cardiovascular system: S1 & S2 heard, Rate controlled  Gastrointestinal system: Abdomen is nondistended, soft and nontender.  Central nervous system: Alert, not talking  Extremities: palpable pulses, no swelling  Skin: No rashes, lesions or ulcers Psychiatry: Little restless, not agitated   Data Reviewed: I have personally reviewed following labs and imaging studies  CBC:  Recent Labs Lab 09/16/2016 2150 10/12/16 0456  WBC 15.4* 15.5*  NEUTROABS 13.4*  --   HGB 14.3 14.2  HCT 41.8 42.5  MCV 93.7 95.9  PLT 304 779   Basic Metabolic Panel:  Recent Labs Lab 10/02/2016 2150 10/12/16 0456  NA 140 143  K 6.5* 5.6*  CL 101 103  CO2 20* 21*  GLUCOSE 122* 119*  BUN 133* 130*  CREATININE 8.72* 8.19*  CALCIUM 8.3* 8.5*   GFR: Estimated Creatinine Clearance: 7.1 mL/min (A) (by C-G formula based on SCr of 8.19 mg/dL (H)). Liver Function Tests:  Recent Labs Lab 10/09/2016 2150  AST 20  ALT 20  ALKPHOS 68  BILITOT 0.7  PROT 6.8  ALBUMIN 2.7*    Recent Labs Lab 10/10/2016 2150  LIPASE 15   No results for input(s): AMMONIA in  the last 168 hours. Coagulation Profile: No results for input(s): INR, PROTIME in the last 168 hours. Cardiac Enzymes: No results for input(s): CKTOTAL, CKMB, CKMBINDEX, TROPONINI in the last 168 hours. BNP (last 3 results) No results for input(s): PROBNP in the last 8760 hours. HbA1C: No results for input(s): HGBA1C in the last 72 hours. CBG: No results for input(s): GLUCAP in the last 168 hours. Lipid Profile: No results for input(s): CHOL, HDL, LDLCALC, TRIG, CHOLHDL, LDLDIRECT in the last 72 hours. Thyroid Function Tests: No results for input(s): TSH, T4TOTAL, FREET4, T3FREE, THYROIDAB in the last 72 hours. Anemia Panel: No results for input(s): VITAMINB12, FOLATE, FERRITIN, TIBC, IRON, RETICCTPCT in the last 72 hours. Urine analysis:    Component Value Date/Time   COLORURINE YELLOW 09/16/2016 2308   APPEARANCEUR TURBID (A) 09/23/2016 2308   LABSPEC 1.016 09/25/2016 2308   PHURINE 5.0 09/28/2016 2308   GLUCOSEU NEGATIVE 09/27/2016 2308   HGBUR MODERATE (A) 09/29/2016 2308   BILIRUBINUR NEGATIVE 10/01/2016 2308   KETONESUR NEGATIVE 10/01/2016 2308   PROTEINUR 100 (A) 10/13/2016 2308   UROBILINOGEN 0.2 10/01/2013 1545   NITRITE NEGATIVE 09/19/2016 2308   LEUKOCYTESUR TRACE (A) 09/25/2016 2308   Sepsis Labs: '@LABRCNTIP' (procalcitonin:4,lacticidven:4)   )No results found for this or any previous visit (from the past 240 hour(s)).    Radiology Studies: Ct Abdomen Pelvis Wo Contrast  Result Date: 09/17/2016 CLINICAL DATA:  Abdominal pain. Technologist notes state hospice patient's with constipation. EXAM: CT ABDOMEN AND PELVIS WITHOUT CONTRAST TECHNIQUE: Multidetector CT imaging of the abdomen and pelvis was performed following the standard protocol without IV contrast. COMPARISON:  Most recent CT 10/01/2013 FINDINGS: Lower chest: Motion artifact. Small pleural effusions and adjacent atelectasis. Probable emphysema. Post CABG. The heart is enlarged. Hepatobiliary: Multiple  water density lesions throughout the liver, similar to prior exam consistent with simple cysts. No evidence of new or solid lesion allowing for lack contrast. Mild nodular hepatic contours. Clips in the gallbladder fossa postcholecystectomy. No biliary dilatation. Pancreas: Parenchymal atrophy. No ductal dilatation or inflammation. Spleen: Normal in size without focal abnormality. Adrenals/Urinary Tract: Normal adrenal glands. Prominence of the right renal collecting system in part secondary to parapelvic cysts as seen on prior exam. There is bilateral ureteral prominence likely secondary to bladder distention. No evidence of obstructing stone. Cortical scarring in the lower right kidney, not as well-defined on the current exam. Punctate calcifications in the mid and lower right kidney are likely parenchymal. Nonobstructing stone in the upper left kidney versus vascular calcification. Bladder distention without wall thickening. Stomach/Bowel: Suboptimal bowel assessment given lack of enteric contrast and presence of intra-abdominal ascites. Small nonobstructing gastric wall lipoma in the peri pyloric region, unchanged. No bowel obstruction. Mild small bowel thickening of  left bowel loops likely secondary to ascites. Moderate colonic stool burden with colonic tortuosity. No colonic inflammatory change. Appendix not definitively visualized. Stomach is decompressed with small hiatal hernia. Vascular/Lymphatic: Infrarenal abdominal aortic aneurysm maximal dimension 4.2 cm. Moderate aortic atherosclerosis. No periaortic soft tissue stranding. Limited assessment for adenopathy, no bulky adenopathy is seen. Reproductive: Dystrophic calcifications in the prostate gland/prostate bed. Other: Moderate abdominopelvic and mesenteric ascites. Mild mesenteric edema. Post repair of right inguinal hernia. No free air. Musculoskeletal: Degenerative change throughout spine. There are no acute or suspicious osseous abnormalities.  IMPRESSION: 1. Moderate colonic stool burden suggesting constipation. No evidence of bowel inflammation or obstruction. 2. Nodular hepatic contours consistent with cirrhosis. Multiple hepatic cysts without evidence of solid lesion on noncontrast exam. Moderate volume abdominopelvic ascites. 3. Bladder distention with mild resultant ureteral dilatation. No bladder wall thickening. 4. Aortic atherosclerosis with abdominal aortic aneurysm, maximal dimension 4.2 cm. Follow-up in surgical consultation based on patient's comorbidities. ACR consensus guidelines recommend followup by ultrasound in 1 year given size. Electronically Signed   By: Jeb Levering M.D.   On: 09/24/2016 23:37   Dg Chest Portable 1 View  Result Date: 10/14/2016 CLINICAL DATA:  Cough EXAM: PORTABLE CHEST 1 VIEW COMPARISON:  04/29/2016 FINDINGS: Post sternotomy changes. Cervical spine hardware. Low lung volumes bilaterally. Bibasilar atelectasis or pneumonia. Mild cardiomegaly. Aortic atherosclerosis. No pneumothorax. IMPRESSION: 1. Low lung volumes with suspected tiny effusions and bibasilar atelectasis or pneumonia 2. Cardiomegaly Electronically Signed   By: Donavan Foil M.D.   On: 09/21/2016 20:35      Scheduled Meds: . brimonidine  1 drop Both Eyes QHS  . liver oil-zinc oxide   Topical Daily   Continuous Infusions: . HYDROmorphone       LOS: 1 day   Time spent: 25 minutes  Greater than 50% of the time spent on counseling and coordinating the care.   Leisa Lenz, MD [facility] Pager [phone]  If 7PM-7AM, please contact night-coverage [email or web address] Password [email or web address] 10/13/2016, 1:45 PM

## 2016-10-13 NOTE — Progress Notes (Signed)
Met with family- full comfort measures- anticipate a hospital death.

## 2016-10-13 NOTE — Progress Notes (Signed)
[facility] RN Visit. @0830   This is a related and covered GIP admission of 10/12/2016 with [facility] diagnosis of Alzheimer's disease per Dr. Earlie Counts.  Code status: OOF DNR on chart. ?Patient  transported form [place] to ED for evaluation of abdominal pain and constipation. Hospice was notified.   Visited patient, sitter at bedside. Patient appears comfortable. Eyes are open but he does not respond to voice. Foley continues to drain brown urine. PIV in right forearm. Continues to be NPO status due to aspiration on 10/12/2016.  PMT to consult today.   Medications: NS@ KVO. Zosyn IVPB 2.25g Q8hrs. No PRN medications have been given.   Dr.  Lavell Anchors and Dr. Earlie Counts notified of admission. Transfer Summary and medication list placed on shadow chart.   Will continue to follow while hospitalized and anticipate discharge needs.   Please be aware that [facility] is contracted with [facility] for any transfers to facilities or home.  Please call with any hospice related questions.   Thank you Farrel Gordon, RN, [facility] Liaison  [phone]   All hospital liaisons are on [place]

## 2016-10-14 NOTE — Progress Notes (Addendum)
[facility] Visit. @ 0840  This is a related and covered GIP admission of 09/27/2018with [facility] diagnosis of Alzheimer's disease per Dr. Earlie Counts. Code status: OOF DNR on chart. Patient transported form [place] to ED for evaluation of abdominal pain and constipation. Hospice was notified.   Visited patient's room.  Patient was asleep.  Daughter, Lynelle Smoke was at bedside.  Patient did not arouse to [place] talking.  Patient had good rise and fall of chest.  One eye partially open.  Patient NAD.  Patient has foley with dark brown yield and very small amount.  Patient does look like he has some abdominal distention.  Tammy advised that patient no longer has sitters, because family is at bedside round the clock.  Tammy further advised that his grandchildren are coming in to see him today.  She had stated that around 0600 this morning, the staff came in and gave patient "a real good bath and got all the sticky placed off of him, you could see all of his baby blue eyes".  Patient is mostly sleeping now, is non verbal.  It is anticipated that this will be a hospital death.   Patient is receiving:  Continuous Medications:  HYDROmorphone (DILAUDID) 100 MG in sodium chloride 0.9% 50 mL (2mg /mL) infusion, Rate 0.13 mL/hr, Dose 0.25 mg/hr, Continuous via IV.  No PRN medications given thus far today.   We will continue to monitor patient while hospitalized.  Please feel free to call with any hospice related questions.  Thank you,  Edyth Gunnels, RN, BSN [facility] Liaison [phone]  All hospital liaisons are now on [place].

## 2016-10-14 NOTE — Progress Notes (Signed)
Pt is resting quietly. Mild throat raddle. Family are at the [facility]

## 2016-10-14 NOTE — Progress Notes (Addendum)
Patient ID: [name], male   DOB: April 23, 1931, 83 y.o.   MRN: [id number]  PROGRESS NOTE    [name]  CLE:[id number] DOB: Nov 07, 1931 DOA: 10/10/2016  PCP: Marin Olp, MD   Brief Narrative:  83 year old male with advanced dementia, mostly bedbound, coronary artery disease status post CABG who presented to ER with persistent abdominal discomfort for past few days prior to the admission. He was on Cipro for possible diverticulitis. In ED, patient was hemodynamically stable. CT scan showed moderate stool burden with distended bladder and ureter. The labs revealed acute renal failure with creatinine of 8.7 which has markedly increased from April 2018 at which time patient had normal creatinine. Potassium was 6.5. Foley catheter was placed following which patient had good urine output. Patient was given Kayexalate, calcium gluconate in ER for hyperkalemia. Palliative care team met with the family, at this time family wishes to proceed with full comfort care.   Assessment & Plan:   Acute renal failure secondary to obstructive uropathy - Foley catheter placed in ED - There was only minimal improvement in renal function with placement of Foley catheter - No further blood work as focus is on comfort care   Acute metabolic encephalopathy / dementia without behavioral disturbance - Encephalopathy likely secondary to combination of dementia as well as renal failure, possibly uremia in addition to pneumonia - No significant changes in mental status   Hyperkalemia - Due to acute renal failure - Given Kayexalate and calcium gluconate in ED - No further blood work due to focus on comfort   Failure to thrive in adult - Patient follows with hospice at home - Focus on comfort care   Aspiration pneumonia - Patient was on Zosyn 10/12/2016 but this is now stopped as focus is on comfort   DVT prophylaxis: SCD's Code Status: DNR/DNI Family Communication: Daughter at the bedside    Disposition Plan: poor prognosis, not stable for discharge or transfer    Consultants:   PCT  Procedures:   None   Antimicrobials:   Zosyn 10/12/2016 --> 10/13/2016    Subjective: No overnight events.  Objective: Vitals:   10/13/16 0500 10/13/16 0509 10/13/16 2012 10/14/16 0507  BP:  (!) 128/94 122/86 116/68  Pulse:  87 93 95  Resp:  _0 Temp:  98 F (36.7 C) 99 F (37.2 C) 99.7 F (37.6 C)  TempSrc:  Axillary Axillary Axillary  SpO2:  92% 91% 92%  Weight: 76.2 kg (168 lb)       Intake/Output Summary (Last 24 hours) at 10/14/16 1511 Last data filed at 10/14/16 0300  Gross per 24 hour  Intake             1.65 ml  Output                0 ml  Net             1.65 ml   Filed Weights   10/13/16 0500  Weight: 76.2 kg (168 lb)    Examination:  General exam: appears ill  Respiratory system: rhonchorous, wheezing  Cardiovascular system: S1 & S2 heard, rate controlled  Gastrointestinal system: distended, (+) BS  Central nervous system: not verbal  Extremities: palpable pulses, no tenderness  Skin: warm, dry  Psychiatry: no agitation, no restlessness   Data Reviewed: I have personally reviewed following labs and imaging studies  CBC:  Recent Labs Lab 10/10/2016 2150 10/12/16 0456  WBC 15.4* 15.5*  NEUTROABS 13.4*  --  HGB 14.3 14.2  HCT 41.8 42.5  MCV 93.7 95.9  PLT 304 147   Basic Metabolic Panel:  Recent Labs Lab 10/07/2016 2150 10/12/16 0456  NA 140 143  K 6.5* 5.6*  CL 101 103  CO2 20* 21*  GLUCOSE 122* 119*  BUN 133* 130*  CREATININE 8.72* 8.19*  CALCIUM 8.3* 8.5*   GFR: Estimated Creatinine Clearance: 7.1 mL/min (A) (by C-G formula based on SCr of 8.19 mg/dL (H)). Liver Function Tests:  Recent Labs Lab 09/18/2016 2150  AST 20  ALT 20  ALKPHOS 68  BILITOT 0.7  PROT 6.8  ALBUMIN 2.7*    Recent Labs Lab 09/18/2016 2150  LIPASE 15   No results for input(s): AMMONIA in the last 168 hours. Coagulation Profile: No results  for input(s): INR, PROTIME in the last 168 hours. Cardiac Enzymes: No results for input(s): CKTOTAL, CKMB, CKMBINDEX, TROPONINI in the last 168 hours. BNP (last 3 results) No results for input(s): PROBNP in the last 8760 hours. HbA1C: No results for input(s): HGBA1C in the last 72 hours. CBG: No results for input(s): GLUCAP in the last 168 hours. Lipid Profile: No results for input(s): CHOL, HDL, LDLCALC, TRIG, CHOLHDL, LDLDIRECT in the last 72 hours. Thyroid Function Tests: No results for input(s): TSH, T4TOTAL, FREET4, T3FREE, THYROIDAB in the last 72 hours. Anemia Panel: No results for input(s): VITAMINB12, FOLATE, FERRITIN, TIBC, IRON, RETICCTPCT in the last 72 hours. Urine analysis:    Component Value Date/Time   COLORURINE YELLOW 10/03/2016 2308   APPEARANCEUR TURBID (A) 09/20/2016 2308   LABSPEC 1.016 09/19/2016 2308   PHURINE 5.0 10/15/2016 2308   GLUCOSEU NEGATIVE 10/02/2016 2308   HGBUR MODERATE (A) 09/28/2016 2308   BILIRUBINUR NEGATIVE 09/26/2016 2308   KETONESUR NEGATIVE 09/26/2016 2308   PROTEINUR 100 (A) 10/12/2016 2308   UROBILINOGEN 0.2 10/01/2013 1545   NITRITE NEGATIVE 09/27/2016 2308   LEUKOCYTESUR TRACE (A) 10/01/2016 2308   Sepsis Labs: _0 (procalcitonin:4,lacticidven:4)   )No results found for this or any previous visit (from the past 240 hour(s)).    Radiology Studies: Ct Abdomen Pelvis Wo Contrast  Result Date: 09/17/2016 CLINICAL DATA:  Abdominal pain. Technologist notes state hospice patient's with constipation. EXAM: CT ABDOMEN AND PELVIS WITHOUT CONTRAST TECHNIQUE: Multidetector CT imaging of the abdomen and pelvis was performed following the standard protocol without IV contrast. COMPARISON:  Most recent CT 10/01/2013 FINDINGS: Lower chest: Motion artifact. Small pleural effusions and adjacent atelectasis. Probable emphysema. Post CABG. The heart is enlarged. Hepatobiliary: Multiple water density lesions throughout the liver, similar to  prior exam consistent with simple cysts. No evidence of new or solid lesion allowing for lack contrast. Mild nodular hepatic contours. Clips in the gallbladder fossa postcholecystectomy. No biliary dilatation. Pancreas: Parenchymal atrophy. No ductal dilatation or inflammation. Spleen: Normal in size without focal abnormality. Adrenals/Urinary Tract: Normal adrenal glands. Prominence of the right renal collecting system in part secondary to parapelvic cysts as seen on prior exam. There is bilateral ureteral prominence likely secondary to bladder distention. No evidence of obstructing stone. Cortical scarring in the lower right kidney, not as well-defined on the current exam. Punctate calcifications in the mid and lower right kidney are likely parenchymal. Nonobstructing stone in the upper left kidney versus vascular calcification. Bladder distention without wall thickening. Stomach/Bowel: Suboptimal bowel assessment given lack of enteric contrast and presence of intra-abdominal ascites. Small nonobstructing gastric wall lipoma in the peri pyloric region, unchanged. No bowel obstruction. Mild small bowel thickening of left bowel loops likely secondary to  ascites. Moderate colonic stool burden with colonic tortuosity. No colonic inflammatory change. Appendix not definitively visualized. Stomach is decompressed with small hiatal hernia. Vascular/Lymphatic: Infrarenal abdominal aortic aneurysm maximal dimension 4.2 cm. Moderate aortic atherosclerosis. No periaortic soft tissue stranding. Limited assessment for adenopathy, no bulky adenopathy is seen. Reproductive: Dystrophic calcifications in the prostate gland/prostate bed. Other: Moderate abdominopelvic and mesenteric ascites. Mild mesenteric edema. Post repair of right inguinal hernia. No free air. Musculoskeletal: Degenerative change throughout spine. There are no acute or suspicious osseous abnormalities. IMPRESSION: 1. Moderate colonic stool burden suggesting  constipation. No evidence of bowel inflammation or obstruction. 2. Nodular hepatic contours consistent with cirrhosis. Multiple hepatic cysts without evidence of solid lesion on noncontrast exam. Moderate volume abdominopelvic ascites. 3. Bladder distention with mild resultant ureteral dilatation. No bladder wall thickening. 4. Aortic atherosclerosis with abdominal aortic aneurysm, maximal dimension 4.2 cm. Follow-up in surgical consultation based on patient's comorbidities. ACR consensus guidelines recommend followup by ultrasound in 1 year given size. Electronically Signed   By: Jeb Levering M.D.   On: 10/10/2016 23:37   Dg Chest Portable 1 View  Result Date: 10/09/2016 CLINICAL DATA:  Cough EXAM: PORTABLE CHEST 1 VIEW COMPARISON:  04/29/2016 FINDINGS: Post sternotomy changes. Cervical spine hardware. Low lung volumes bilaterally. Bibasilar atelectasis or pneumonia. Mild cardiomegaly. Aortic atherosclerosis. No pneumothorax. IMPRESSION: 1. Low lung volumes with suspected tiny effusions and bibasilar atelectasis or pneumonia 2. Cardiomegaly Electronically Signed   By: Donavan Foil M.D.   On: 09/26/2016 20:35      Scheduled Meds: . brimonidine  1 drop Both Eyes QHS  . liver oil-zinc oxide   Topical Daily   Continuous Infusions: . HYDROmorphone 0.25 mg/hr (10/13/16 1420)     LOS: 2 days   Time spent: 15 minutes  Greater than 50% of the time spent on counseling and coordinating the care.   Leisa Lenz, MD [facility] Pager [phone]  If 7PM-7AM, please contact night-coverage [email or web address] Password [email or web address] 10/14/2016, 3:11 PM

## 2016-10-15 NOTE — Progress Notes (Addendum)
[facility] RN Visit. @ 0820  This is a related and covered GIP admission of 09/27/2018with [facility] diagnosis of Alzheimer's disease per Dr. Earlie Counts. Code status: OOF DNR on chart. Patient transported form [place] to ED for evaluation of abdominal pain and constipation. Hospice was notified.   Visited patient in room.  Patient was asleep.  Patient in NAD.  Patient has good rise and fall of chest.  Patient does still have some rattle, but not as pronounced as yesterday.  Patient was breathing with mouth open.  Daughter was at bedside.  She advised that he does open his eye periodically, and smiled at her this morning.  He is non-verbal at this time.  Patient does have a sitter at bedside.  Patient is still an anticipated hospital death.   Patient is receiving:  Continuous medications:  HYDROmorphone (DILAUDID) 100 mg in sodium chloride 0.9% 50 mL (2 mg/mL) infusion, Rate 0.13 mL/hr, Dose 0.25 mg/hr, Continuous via IV.  We will continue to follow patient while hospitalized.  Thank you,  Edyth Gunnels, RN, BSN [facility] Liaison [phone]  All hospital liaisons are now on [place].

## 2016-10-15 NOTE — Progress Notes (Signed)
Family reported that [name] suddenly opened  His eyes, arms were jerking x 2 and I  saw pain on his facial expression. He also appeared very uncomfortable with  Changes in positions. I have sent a message to Dr Charlies Silvers

## 2016-10-15 NOTE — Progress Notes (Signed)
Patient ID: [name], male   DOB: 22-Jan-1931, 83 y.o.   MRN: [id number]  PROGRESS NOTE    KARANVIR BALDERSTON  KGM:[id number] DOB: 10/06/1931 DOA: 10/08/2016  PCP: Marin Olp, MD   Brief Narrative:  83 year old male with advanced dementia, mostly bedbound, coronary artery disease status post CABG who presented to ER with persistent abdominal discomfort for past few days prior to the admission. He was on Cipro for possible diverticulitis. In ED, patient was hemodynamically stable. CT scan showed moderate stool burden with distended bladder and ureter. The labs revealed acute renal failure with creatinine of 8.7 which has markedly increased from April 2018 at which time patient had normal creatinine. Potassium was 6.5. Foley catheter was placed following which patient had good urine output. Patient was given Kayexalate, calcium gluconate in ER for hyperkalemia. Palliative care team met with the family, at this time family wishes to proceed with full comfort care.   Assessment & Plan:   Acute renal failure secondary to obstructive uropathy - Foley catheter placed in ED - No urine output in past 24-48 hours - Supportive care   Acute metabolic encephalopathy / dementia without behavioral disturbance - Encephalopathy likely secondary to combination of dementia as well as renal failure, possibly uremia in addition to pneumonia - Awake but minimally responsive   Hyperkalemia - Due to acute renal failure - Given Kayexalate and calcium gluconate in ED - No further blood work as focus on comfort   Failure to thrive in adult - Patient follows with hospice at home - Focus on comfort care   Aspiration pneumonia - Abx stopped as focus on comfort  Care    DVT prophylaxis: SCD's Code Status: DNR/DNI Family Communication: Discussed with daughter at the bedside  Disposition Plan: not stable for discharge or transfer at this time, poor prognosis    Consultants:   PCT  Procedures:    None    Antimicrobials:   Zosyn 10/12/2016 --> 10/13/2016    Subjective: No overnight events.  Objective: Vitals:   10/13/16 0509 10/13/16 2012 10/14/16 0507 10/15/16 0553  BP: (!) 128/94 122/86 116/68 126/78  Pulse: 87 93 95 88  Resp: '16 16 16 12  ' Temp: 98 F (36.7 C) 99 F (37.2 C) 99.7 F (37.6 C) 98 F (36.7 C)  TempSrc: Axillary Axillary Axillary Axillary  SpO2: 92% 91% 92% 94%  Weight:       No intake or output data in the 24 hours ending 10/15/16 0938 Filed Weights   10/13/16 0500  Weight: 76.2 kg (168 lb)    Physical Exam  Constitutional: Appears ill, no distress  CVS: RRR, S1/S2 + Pulmonary: rhomchorous, wheezing  Abdominal: Soft. BS +,  no distension, tenderness, rebound or guarding.  Musculoskeletal: No edema and no tenderness.  Neuro: Eyes opened but not responding to verbal stimuli  Skin: Skin is warm and dry.  Psychiatric: unable to assess, minimally responsive     Data Reviewed: I have personally reviewed following labs and imaging studies  CBC:  Recent Labs Lab 09/24/2016 2150 10/12/16 0456  WBC 15.4* 15.5*  NEUTROABS 13.4*  --   HGB 14.3 14.2  HCT 41.8 42.5  MCV 93.7 95.9  PLT 304 644   Basic Metabolic Panel:  Recent Labs Lab 09/26/2016 2150 10/12/16 0456  NA 140 143  K 6.5* 5.6*  CL 101 103  CO2 20* 21*  GLUCOSE 122* 119*  BUN 133* 130*  CREATININE 8.72* 8.19*  CALCIUM 8.3* 8.5*   GFR:  Estimated Creatinine Clearance: 7.1 mL/min (A) (by C-G formula based on SCr of 8.19 mg/dL (H)). Liver Function Tests:  Recent Labs Lab 10/05/2016 2150  AST 20  ALT 20  ALKPHOS 68  BILITOT 0.7  PROT 6.8  ALBUMIN 2.7*    Recent Labs Lab 09/25/2016 2150  LIPASE 15   No results for input(s): AMMONIA in the last 168 hours. Coagulation Profile: No results for input(s): INR, PROTIME in the last 168 hours. Cardiac Enzymes: No results for input(s): CKTOTAL, CKMB, CKMBINDEX, TROPONINI in the last 168 hours. BNP (last 3 results) No  results for input(s): PROBNP in the last 8760 hours. HbA1C: No results for input(s): HGBA1C in the last 72 hours. CBG: No results for input(s): GLUCAP in the last 168 hours. Lipid Profile: No results for input(s): CHOL, HDL, LDLCALC, TRIG, CHOLHDL, LDLDIRECT in the last 72 hours. Thyroid Function Tests: No results for input(s): TSH, T4TOTAL, FREET4, T3FREE, THYROIDAB in the last 72 hours. Anemia Panel: No results for input(s): VITAMINB12, FOLATE, FERRITIN, TIBC, IRON, RETICCTPCT in the last 72 hours. Urine analysis:    Component Value Date/Time   COLORURINE YELLOW 09/25/2016 2308   APPEARANCEUR TURBID (A) 10/10/2016 2308   LABSPEC 1.016 09/24/2016 2308   PHURINE 5.0 09/24/2016 2308   GLUCOSEU NEGATIVE 09/23/2016 2308   HGBUR MODERATE (A) 10/02/2016 2308   BILIRUBINUR NEGATIVE 09/27/2016 2308   KETONESUR NEGATIVE 09/18/2016 2308   PROTEINUR 100 (A) 09/26/2016 2308   UROBILINOGEN 0.2 10/01/2013 1545   NITRITE NEGATIVE 09/24/2016 2308   LEUKOCYTESUR TRACE (A) 10/02/2016 2308   Sepsis Labs: '@[email or web address]' (procalcitonin:4,lacticidven:4)   )No results found for this or any previous visit (from the past 240 hour(s)).    Radiology Studies: Ct Abdomen Pelvis Wo Contrast  Result Date: 09/26/2016 CLINICAL DATA:  Abdominal pain. Technologist notes state hospice patient's with constipation. EXAM: CT ABDOMEN AND PELVIS WITHOUT CONTRAST TECHNIQUE: Multidetector CT imaging of the abdomen and pelvis was performed following the standard protocol without IV contrast. COMPARISON:  Most recent CT 10/01/2013 FINDINGS: Lower chest: Motion artifact. Small pleural effusions and adjacent atelectasis. Probable emphysema. Post CABG. The heart is enlarged. Hepatobiliary: Multiple water density lesions throughout the liver, similar to prior exam consistent with simple cysts. No evidence of new or solid lesion allowing for lack contrast. Mild nodular hepatic contours. Clips in the gallbladder fossa  postcholecystectomy. No biliary dilatation. Pancreas: Parenchymal atrophy. No ductal dilatation or inflammation. Spleen: Normal in size without focal abnormality. Adrenals/Urinary Tract: Normal adrenal glands. Prominence of the right renal collecting system in part secondary to parapelvic cysts as seen on prior exam. There is bilateral ureteral prominence likely secondary to bladder distention. No evidence of obstructing stone. Cortical scarring in the lower right kidney, not as well-defined on the current exam. Punctate calcifications in the mid and lower right kidney are likely parenchymal. Nonobstructing stone in the upper left kidney versus vascular calcification. Bladder distention without wall thickening. Stomach/Bowel: Suboptimal bowel assessment given lack of enteric contrast and presence of intra-abdominal ascites. Small nonobstructing gastric wall lipoma in the peri pyloric region, unchanged. No bowel obstruction. Mild small bowel thickening of left bowel loops likely secondary to ascites. Moderate colonic stool burden with colonic tortuosity. No colonic inflammatory change. Appendix not definitively visualized. Stomach is decompressed with small hiatal hernia. Vascular/Lymphatic: Infrarenal abdominal aortic aneurysm maximal dimension 4.2 cm. Moderate aortic atherosclerosis. No periaortic soft tissue stranding. Limited assessment for adenopathy, no bulky adenopathy is seen. Reproductive: Dystrophic calcifications in the prostate gland/prostate bed. Other: Moderate abdominopelvic and mesenteric ascites.  Mild mesenteric edema. Post repair of right inguinal hernia. No free air. Musculoskeletal: Degenerative change throughout spine. There are no acute or suspicious osseous abnormalities. IMPRESSION: 1. Moderate colonic stool burden suggesting constipation. No evidence of bowel inflammation or obstruction. 2. Nodular hepatic contours consistent with cirrhosis. Multiple hepatic cysts without evidence of solid  lesion on noncontrast exam. Moderate volume abdominopelvic ascites. 3. Bladder distention with mild resultant ureteral dilatation. No bladder wall thickening. 4. Aortic atherosclerosis with abdominal aortic aneurysm, maximal dimension 4.2 cm. Follow-up in surgical consultation based on patient's comorbidities. ACR consensus guidelines recommend followup by ultrasound in 1 year given size. Electronically Signed   By: Jeb Levering M.D.   On: 09/27/2016 23:37   Dg Chest Portable 1 View  Result Date: 09/27/2016 CLINICAL DATA:  Cough EXAM: PORTABLE CHEST 1 VIEW COMPARISON:  04/29/2016 FINDINGS: Post sternotomy changes. Cervical spine hardware. Low lung volumes bilaterally. Bibasilar atelectasis or pneumonia. Mild cardiomegaly. Aortic atherosclerosis. No pneumothorax. IMPRESSION: 1. Low lung volumes with suspected tiny effusions and bibasilar atelectasis or pneumonia 2. Cardiomegaly Electronically Signed   By: Donavan Foil M.D.   On: 09/30/2016 20:35      Scheduled Meds: . brimonidine  1 drop Both Eyes QHS  . liver oil-zinc oxide   Topical Daily   Continuous Infusions: . HYDROmorphone 0.25 mg/hr (10/13/16 1420)     LOS: 3 days   Time spent: 15 minutes  Greater than 50% of the time spent on counseling and coordinating the care.   Leisa Lenz, MD [facility] Pager [phone]  If 7PM-7AM, please contact night-coverage [email or web address] Password [facility] 10/15/2016, 9:38 AM

## 2016-10-16 DIAGNOSIS — G9340 Encephalopathy, unspecified: Secondary | ICD-10-CM

## 2016-10-16 NOTE — Progress Notes (Signed)
[facility] [place] [facility] RN Visit at 11:15am  This is a related and covered GIP admission of 09/27/2018with [facility] diagnosis of Alzheimer's disease per Dr. Earlie Counts. Code status: OOF DNR on chart. Patient transported form [place] to ED for evaluation of abdominal pain and constipation. Hospice was notified.   Checked in with bedside RN who stated patient has had some changes in skin color and breathing overnight but has remained comfortable through night shift and today. Visited patient in room with daughters and [place] comfort sitter at side.  Patient min responsive to verbal stimuli with no visible signs of discomfort. Respirations shallow, regular and slightly labored with audible congestion noted on 4L O2 via [place]. Supported Daughters who verbalize satisfaction with patient care and pain control and are progressing toward acceptance of patient anticipated hospital death.  Patient is receiving:  Continuous medications:  HYDROmorphone (DILAUDID) 100 mg in sodium chloride 0.9% 50 mL (2 mg/mL) infusion, Rate 0.13 mL/hr, Dose 0.25 mg/hr, Continuous via IV.  We will continue to follow patient while hospitalized.  Thank you,  Gar Ponto, RN,  [facility] Liaison [phone]  All hospital liaisons are now on [place].

## 2016-10-16 NOTE — Progress Notes (Signed)
Patient ID: [name], male   DOB: June 18, 1931, 83 y.o.   MRN: [id number]  PROGRESS NOTE    JERMANI EBERLEIN  BZJ:[id number] DOB: 1931-09-14 DOA: 10/10/2016  PCP: Marin Olp, MD   Brief Narrative:  83 year old male with advanced dementia, mostly bedbound, coronary artery disease status post CABG who presented to ER with persistent abdominal discomfort for past few days prior to the admission. He was on Cipro for possible diverticulitis. In ED, patient was hemodynamically stable. CT scan showed moderate stool burden with distended bladder and ureter. The labs revealed acute renal failure with creatinine of 8.7 which has markedly increased from April 2018 at which time patient had normal creatinine. Potassium was 6.5. Foley catheter was placed following which patient had good urine output. Patient was given Kayexalate, calcium gluconate in ER for hyperkalemia. Palliative care team met with the family, at this time family wishes to proceed with full comfort care.   Assessment & Plan:   Acute renal failure secondary to obstructive uropathy - Foley catheter placed in ED - No urine output over past 72 hours  Acute metabolic encephalopathy / dementia without behavioral disturbance - Encephalopathy likely secondary to combination of dementia as well as renal failure, possibly uremia in addition to pneumonia - Not responsive to verbal stimuli, minimally responsive to painful stimuli  Hyperkalemia - Due to acute renal failure - Given Kayexalate and calcium gluconate in ED - No further blood work, focus on comfort care  Failure to thrive in adult - Patient follows with hospice at home - Focus on comfort care  Aspiration pneumonia - All antibiotics stopped as focus is on comfort care   DVT prophylaxis: SCDs Code Status: DNR/DNI Family Communication: Discussed with the daughter at the bedside  Disposition Plan: Not stable for discharge or transfer this time, poor  prognosis   Consultants:   PCT  Procedures:   None    Antimicrobials:   Zosyn 10/12/2016 --> 10/13/2016    Subjective: More restless last 24 hours, Dilaudid increased for comfort.  Objective: Vitals:   10/13/16 2012 10/14/16 0507 10/15/16 0553 10/16/16 0520  BP: 122/86 116/68 126/78 (!) 79/48  Pulse: 93 95 88 87  Resp: _0 Temp: 99 F (37.2 C) 99.7 F (37.6 C) 98 F (36.7 C) 99.2 F (37.3 C)  TempSrc: Axillary Axillary Axillary Axillary  SpO2: 91% 92% 94% 93%  Weight:        Intake/Output Summary (Last 24 hours) at 10/16/16 1014 Last data filed at 10/16/16 0600  Gross per 24 hour  Intake             6.63 ml  Output                0 ml  Net             6.63 ml   Filed Weights   10/13/16 0500  Weight: 76.2 kg (168 lb)    Physical Exam  Constitutional: Appears well-developed and well-nourished. No distress.  CVS: Appreciate S1-S2, rate controlled Pulmonary: Rhonchorous, diminished breath sounds Abdominal: Unable to appreciate bowel sounds, distended abdomen  Musculoskeletal: No tenderness, pulses palpable Neuro: Does not respond to verbal stimuli, minimally responsive to painful stimuli Skin: Warm dry  Psychiatric: Does not appear to be restless or agitated at this time     Data Reviewed: I have personally reviewed following labs and imaging studies  CBC:  Recent Labs Lab 09/21/2016 2150 10/12/16 0456  WBC 15.4* 15.5*  NEUTROABS 13.4*  --   HGB 14.3 14.2  HCT 41.8 42.5  MCV 93.7 95.9  PLT 304 056   Basic Metabolic Panel:  Recent Labs Lab 09/23/2016 2150 10/12/16 0456  NA 140 143  K 6.5* 5.6*  CL 101 103  CO2 20* 21*  GLUCOSE 122* 119*  BUN 133* 130*  CREATININE 8.72* 8.19*  CALCIUM 8.3* 8.5*   GFR: Estimated Creatinine Clearance: 7.1 mL/min (A) (by C-G formula based on SCr of 8.19 mg/dL (H)). Liver Function Tests:  Recent Labs Lab 09/16/2016 2150  AST 20  ALT 20  ALKPHOS 68  BILITOT 0.7  PROT 6.8  ALBUMIN 2.7*     Recent Labs Lab 10/03/2016 2150  LIPASE 15   No results for input(s): AMMONIA in the last 168 hours. Coagulation Profile: No results for input(s): INR, PROTIME in the last 168 hours. Cardiac Enzymes: No results for input(s): CKTOTAL, CKMB, CKMBINDEX, TROPONINI in the last 168 hours. BNP (last 3 results) No results for input(s): PROBNP in the last 8760 hours. HbA1C: No results for input(s): HGBA1C in the last 72 hours. CBG: No results for input(s): GLUCAP in the last 168 hours. Lipid Profile: No results for input(s): CHOL, HDL, LDLCALC, TRIG, CHOLHDL, LDLDIRECT in the last 72 hours. Thyroid Function Tests: No results for input(s): TSH, T4TOTAL, FREET4, T3FREE, THYROIDAB in the last 72 hours. Anemia Panel: No results for input(s): VITAMINB12, FOLATE, FERRITIN, TIBC, IRON, RETICCTPCT in the last 72 hours. Urine analysis:    Component Value Date/Time   COLORURINE YELLOW 10/04/2016 2308   APPEARANCEUR TURBID (A) 09/26/2016 2308   LABSPEC 1.016 10/14/2016 2308   PHURINE 5.0 09/19/2016 2308   GLUCOSEU NEGATIVE 10/07/2016 2308   HGBUR MODERATE (A) 10/08/2016 2308   BILIRUBINUR NEGATIVE 10/07/2016 2308   KETONESUR NEGATIVE 09/26/2016 2308   PROTEINUR 100 (A) 09/22/2016 2308   UROBILINOGEN 0.2 10/01/2013 1545   NITRITE NEGATIVE 09/30/2016 2308   LEUKOCYTESUR TRACE (A) 10/14/2016 2308   Sepsis Labs: _0 (procalcitonin:4,lacticidven:4)   )No results found for this or any previous visit (from the past 240 hour(s)).    Radiology Studies: No results found.    Scheduled Meds: . brimonidine  1 drop Both Eyes QHS  . liver oil-zinc oxide   Topical Daily   Continuous Infusions: . HYDROmorphone 0.25 mg/hr (10/13/16 1420)     LOS: 4 days   Time spent: 25 minutes  Greater than 50% of the time spent on counseling and coordinating the care.   Leisa Lenz, MD [facility] Pager [phone]  If 7PM-7AM, please contact night-coverage [email or web address] Password  [email or web address] 10/16/2016, 10:14 AM

## 2016-10-16 DEATH — deceased

## 2016-10-17 ENCOUNTER — Encounter (HOSPITAL_COMMUNITY): Payer: Self-pay

## 2016-10-17 MED ORDER — OXYMETAZOLINE HCL 0.05 % NA SOLN
1.0000 | Freq: Two times a day (BID) | NASAL | Status: DC
  Filled 2016-10-17: qty 15

## 2016-10-17 NOTE — Consult Note (Signed)
   [facility] CM Inpatient Consult   11/01/2016  KAYDN KUMPF [birth year] [id number]    Chart reviewed for potential [facility] Care Management services.   Chart reviewed. Noted Mr. Kutch is a hospice patient that is transitioning to comfort care.   No [facility] Care Management needs.     Marthenia Rolling, MSN-Ed, RN,BSN [facility] Liaison [phone]

## 2016-10-17 NOTE — Progress Notes (Signed)
Patient ID: [name], male   DOB: 1931/06/26, 83 y.o.   MRN: [id number]  PROGRESS NOTE    LABRANDON KNOCH  WUJ:[id number] DOB: 1931-10-04 DOA: 10/13/2016  PCP: Marin Olp, MD   Brief Narrative:  83 year old male with advanced dementia, mostly bedbound, coronary artery disease status post CABG who presented to ER with persistent abdominal discomfort for past few days prior to the admission. He was on Cipro for possible diverticulitis. In ED, patient was hemodynamically stable. CT scan showed moderate stool burden with distended bladder and ureter. The labs revealed acute renal failure with creatinine of 8.7 which has markedly increased from April 2018 at which time patient had normal creatinine. Potassium was 6.5. Foley catheter was placed following which patient had good urine output. Patient was given Kayexalate, calcium gluconate in ER for hyperkalemia. Palliative care team met with the family, at this time family wishes to proceed with full comfort care.   Assessment & Plan:   Acute renal failure secondary to obstructive uropathy - Patient has Foley catheter - No significant urine output over past couple of days  Acute metabolic encephalopathy / dementia without behavioral disturbance - Likely multifactorial in the setting of known history of dementia, uremia, pneumonia - Comfort care  Hyperkalemia - Due to acute renal failure - Given Kayexalate and calcium gluconate in ED - No further blood work to ensure comfort  Failure to thrive in adult - Nothing by mouth due to high risk of aspiration - Comfort care  Aspiration pneumonia - All antibiotic treatments stopped as focus is on comfort   DVT prophylaxis: SCDs Code Status: DNR/DNI Family Communication: Patient's grandson at the bedside Disposition Plan: Poor prognosis, not stable for transfer or discharge   Consultants:   Palliative care  Procedures:   None    Antimicrobials:   Zosyn 10/12/2016 --> 10/13/2016     Subjective: No overnight events.  Objective: Vitals:   10/15/16 0553 10/16/16 0520 10/16/16 1000 10/16/16 1505  BP: 126/78 (!) 79/48    Pulse: 88 87    Resp: 12 10    Temp:  99.2 F (37.3 C) 99.1 F (37.3 C) 99 F (37.2 C)  TempSrc: Axillary Axillary Axillary Axillary  SpO2: 94% 93%    Weight:       No intake or output data in the 24 hours ending 10/19/2016 0840 Filed Weights   10/13/16 0500  Weight: 76.2 kg (168 lb)    Physical Exam  Constitutional: Appears ill, no distress  CVS: Tachycardic, appreciate S1-S2 Pulmonary: Gurgling sounds, rhonchorous  Abdominal: Do not appreciate significant bowel sounds, abdomen distended  Musculoskeletal: No edema, palpable pulses  Neuro: Does not respond to verbal stimuli, minimally responsive to painful stimuli Skin: Skin is warm and dry.  Psychiatric: Not agitated or restless    Data Reviewed: I have personally reviewed following labs and imaging studies  CBC:  Recent Labs Lab 09/22/2016 2150 10/12/16 0456  WBC 15.4* 15.5*  NEUTROABS 13.4*  --   HGB 14.3 14.2  HCT 41.8 42.5  MCV 93.7 95.9  PLT 304 956   Basic Metabolic Panel:  Recent Labs Lab 09/23/2016 2150 10/12/16 0456  NA 140 143  K 6.5* 5.6*  CL 101 103  CO2 20* 21*  GLUCOSE 122* 119*  BUN 133* 130*  CREATININE 8.72* 8.19*  CALCIUM 8.3* 8.5*   GFR: Estimated Creatinine Clearance: 7.1 mL/min (A) (by C-G formula based on SCr of 8.19 mg/dL (H)). Liver Function Tests:  Recent Labs Lab 10/14/2016  2150  AST 20  ALT 20  ALKPHOS 68  BILITOT 0.7  PROT 6.8  ALBUMIN 2.7*    Recent Labs Lab 09/26/2016 2150  LIPASE 15   No results for input(s): AMMONIA in the last 168 hours. Coagulation Profile: No results for input(s): INR, PROTIME in the last 168 hours. Cardiac Enzymes: No results for input(s): CKTOTAL, CKMB, CKMBINDEX, TROPONINI in the last 168 hours. BNP (last 3 results) No results for input(s): PROBNP in the last 8760 hours. HbA1C: No results  for input(s): HGBA1C in the last 72 hours. CBG: No results for input(s): GLUCAP in the last 168 hours. Lipid Profile: No results for input(s): CHOL, HDL, LDLCALC, TRIG, CHOLHDL, LDLDIRECT in the last 72 hours. Thyroid Function Tests: No results for input(s): TSH, T4TOTAL, FREET4, T3FREE, THYROIDAB in the last 72 hours. Anemia Panel: No results for input(s): VITAMINB12, FOLATE, FERRITIN, TIBC, IRON, RETICCTPCT in the last 72 hours. Urine analysis:    Component Value Date/Time   COLORURINE YELLOW 10/01/2016 2308   APPEARANCEUR TURBID (A) 10/06/2016 2308   LABSPEC 1.016 10/04/2016 2308   PHURINE 5.0 10/15/2016 2308   GLUCOSEU NEGATIVE 09/23/2016 2308   HGBUR MODERATE (A) 10/02/2016 2308   BILIRUBINUR NEGATIVE 10/02/2016 2308   KETONESUR NEGATIVE 10/10/2016 2308   PROTEINUR 100 (A) 09/21/2016 2308   UROBILINOGEN 0.2 10/01/2013 1545   NITRITE NEGATIVE 10/08/2016 2308   LEUKOCYTESUR TRACE (A) 10/09/2016 2308   Sepsis Labs: '@[email or web address]' (procalcitonin:4,lacticidven:4)   )No results found for this or any previous visit (from the past 240 hour(s)).    Radiology Studies: No results found.    Scheduled Meds: . brimonidine  1 drop Both Eyes QHS  . liver oil-zinc oxide   Topical Daily   Continuous Infusions: . HYDROmorphone 0.25 mg/hr (10/13/16 1420)     LOS: 5 days   Time spent: 25 minutes  Greater than 50% of the time spent on counseling and coordinating the care.   Leisa Lenz, MD [facility] Pager [phone]  If 7PM-7AM, please contact night-coverage [email or web address] Password [email or web address] 11/04/2016, 8:40 AM

## 2016-10-17 NOTE — Progress Notes (Addendum)
[facility] [place] RN Visit. @ 1000  This is a related and covered GIP admission of 09/27/2018with [facility] diagnosis of Alzheimer's disease per Dr. Earlie Counts. Code status: OOF DNR on chart. Patient transported form [place] to ED for evaluation of abdominal pain and constipation. Hospice was notified.   Visited patient in room.  Patient was asleep.  Patient in NAD.  Patient has good rise and fall of chest.  Patient has more pronounced rattle in chest.  Foley catheter has no new urine output from patient.  The return from several days ago was extremely dark in color.  Per family, no new output since Thursday of last week.  Patient pale in color.  Patient taking more shallow breaths at this time.  Family remains at bedside with patient.  They are very appreciate of [facility] services.  Emotional support offered to family.  Family denies any needs at this time.    Patient is receiving:  Continuous medications:  HYDROmorphone (DILAUDID) 100 mg in sodium chloride 0.9% 50 mL (2 mg/mL) infusion, Rate 0.5 mL/hr, Dose 1 mg/hr, Continuous via IV.  Patient has received no PRN medications thus far today.  We will continue to follow patient while hospitalized.  Thank you,  Edyth Gunnels, RN, BSN [facility] Liaison [phone]  All hospital liaisons are now on [place].

## 2016-10-17 NOTE — Progress Notes (Signed)
Family requesting patient not be turned Q2H.  Stating this causes patient discomfort and benefits do not outweigh comfort.

## 2016-10-17 NOTE — Progress Notes (Signed)
Nutrition Brief Note  Pt identified on low Braden Score list.  Chart reviewed. Pt now transitioning to comfort care.  No nutrition interventions warranted at this time.   Clayton Bibles, MS, RD, LDN Pager: [phone] After Hours Pager: [phone]

## 2016-10-17 NOTE — Progress Notes (Signed)
Pt passed a few minutes prior to my arrival.  Pt's daughters and two grandchildren we bedside.  They were appropriately grieving, but at peace. They expressed how their mother passed a year ago and how his diseased had affected him. Family would like [facility], [place], in [place] to be called to service the pt remains and final arrangements.  The number is [phone] and the director is Jacqualin Combes.  Pt's family was encouraged to take the time they needed to say goodbyes. Information was given to nurse. Family was grateful for visit and assistance. [place], MDiv   10/20/2016 2200  Clinical Encounter Type  Visited With Family

## 2016-11-16 NOTE — Progress Notes (Signed)
Patient passed at 2120 this evening. Family was at bedside.

## 2016-11-16 NOTE — Progress Notes (Signed)
Wasted 48 ml of 100 mg of hydromorphone in 50 mls of 0.9% normal saline in the sink.  Not located in pyxis.

## 2016-11-16 NOTE — Discharge Summary (Signed)
Death Summary  [name]:[id number] DOB: 11/09/1931 DOA: 11-02-16  PCP: Marin Olp, MD  Admit date: 2016/11/02 Date of Death: 15-Nov-2016  Final Diagnoses:  Principal Problem:   ARF (acute renal failure) ([place]) Active Problems:   Essential hypertension   CAD (coronary artery disease) s/p bypass graft   PVD- hx CEA,patent carotids by doppler July 2015   AAA (abdominal aortic aneurysm) without rupture (H[facility])   Acute encephalopathy   Vascular dementia with behavior disturbance    History of present illness:    Hospital Course:  83 year old male with advanced dementia, mostly bedbound, coronary artery disease status post CABG who presented to ER with persistent abdominal discomfort for past few days prior to the admission. He was on Cipro for possible diverticulitis. In ED, patient was hemodynamically stable. CT scan showed moderate stool burden with distended bladder and ureter. The labs revealed acute renal failure with creatinine of 8.7 which has markedly increased from April 2018 at which time patient had normal creatinine. Potassium was 6.5. Foley catheter was placed following which patient had good urine output. Patient was given Kayexalate, calcium gluconate in ER for hyperkalemia. Palliative care team met with the family, at this time family wishes to proceed with full comfort care.   Assessment & Plan:   Acute renal failure secondary to obstructive uropathy - No blood work to ensure comfort  Acute metabolic encephalopathy / dementia without behavioral disturbance - Likely multifactorial in the setting of known history of dementia, uremia, pneumonia - Comfort care  Hyperkalemia - Due to acute renal failure - Given Kayexalate and calcium gluconate in ED - No further blood work to ensure comfort  Failure to thrive in adult - Nothing by mouth due to high risk of aspiration - Comfort care  Aspiration pneumonia - All antibiotic treatments stopped as focus  is on comfort   DVT prophylaxis: SCDs Code Status: DNR/DNI   Consultants:   Palliative care  Procedures:   None    Antimicrobials:   Zosyn 10/12/2016 --> 10/13/2016    Time: 10 pm on 10/31/2016 I was not physically present at the time pt expired.  Signed:  Leisa Lenz  [facility] 2016/11/15, 11:43 AM

## 2016-11-16 DEATH — deceased
